# Patient Record
Sex: Male | Born: 1945
Health system: Southern US, Community
[De-identification: ages and names within clinical notes are randomized; demographics above are authoritative.]

## PROBLEM LIST (undated history)

## (undated) DIAGNOSIS — D509 Iron deficiency anemia, unspecified: Secondary | ICD-10-CM

## (undated) DIAGNOSIS — I10 Essential (primary) hypertension: Secondary | ICD-10-CM

## (undated) DIAGNOSIS — M1990 Unspecified osteoarthritis, unspecified site: Secondary | ICD-10-CM

## (undated) DIAGNOSIS — M79669 Pain in unspecified lower leg: Secondary | ICD-10-CM

## (undated) DIAGNOSIS — I219 Acute myocardial infarction, unspecified: Secondary | ICD-10-CM

## (undated) DIAGNOSIS — C801 Malignant (primary) neoplasm, unspecified: Secondary | ICD-10-CM

## (undated) DIAGNOSIS — I2510 Atherosclerotic heart disease of native coronary artery without angina pectoris: Secondary | ICD-10-CM

## (undated) DIAGNOSIS — M549 Dorsalgia, unspecified: Secondary | ICD-10-CM

## (undated) DIAGNOSIS — E785 Hyperlipidemia, unspecified: Secondary | ICD-10-CM

## (undated) DIAGNOSIS — R7303 Prediabetes: Secondary | ICD-10-CM

## (undated) HISTORY — DX: Prediabetes: R73.03

## (undated) HISTORY — DX: Atherosclerotic heart disease of native coronary artery without angina pectoris: I25.10

## (undated) HISTORY — DX: Pain in unspecified lower leg: M79.669

## (undated) HISTORY — DX: Unspecified osteoarthritis, unspecified site: M19.90

## (undated) HISTORY — DX: Dorsalgia, unspecified: M54.9

## (undated) HISTORY — DX: Hyperlipidemia, unspecified: E78.5

## (undated) HISTORY — PX: BACK SURGERY: SHX140

## (undated) HISTORY — PX: OTHER SURGICAL HISTORY: SHX169

## (undated) HISTORY — DX: Essential (primary) hypertension: I10

## (undated) HISTORY — DX: Iron deficiency anemia, unspecified: D50.9

## (undated) HISTORY — PX: CORONARY ARTERY BYPASS GRAFT: SHX141

## (undated) NOTE — Progress Notes (Signed)
 Formatting of this note might be different from the original. Subjective  Patient ID: Corey Daniel is a 47 y.o. male presenting to the Urgent Care with a chief complaint of Dizziness (Dizziness x 5 days).  Pt reports he has had episodes of dizziness the last five days. He is suppose to be taking lisinopril  for HTN and has not been taking it. Reports he hasn't fallen. He also reports he is supposed to be taking blood sugar since he is prediabetic and he hasn't been taking it. Orthostatic blood pressure obtained: 120/100 sitting, 130/90 standing, 150/90 lying. Blood sugar 169.   History provided by:  Patient Dizziness Quality:  Vertigo Severity:  Moderate Onset quality:  Sudden Duration:  5 days Timing:  Intermittent Chronicity:  New Context: bending over and standing up   Relieved by:  Turning head and closing eyes Worsened by:  Sitting upright Ineffective treatments:  None tried Risk factors: multiple medications   Risk factors: no heart disease    Objective  BP 125/90 (BP Location: Right arm, Patient Position: Sitting, BP Cuff Size: Adult)   Pulse 66   Temp 36.4 C (97.6 F) (Oral)   Resp 18   Ht 1.803 m (5' 11)   Wt 93.9 kg (207 lb)   SpO2 94%   BMI 28.87 kg/m   Physical Exam Constitutional:      Appearance: Normal appearance.  HENT:     Head: Normocephalic.     Right Ear: Tympanic membrane normal.     Left Ear: Tympanic membrane normal.     Nose: Nose normal.  Eyes:     Pupils: Pupils are equal, round, and reactive to light.  Cardiovascular:     Rate and Rhythm: Normal rate and regular rhythm.     Pulses: Normal pulses.     Heart sounds: Normal heart sounds.  Pulmonary:     Effort: Pulmonary effort is normal.     Breath sounds: Normal breath sounds.  Abdominal:     General: Abdomen is flat.  Musculoskeletal:        General: Normal range of motion.  Skin:    General: Skin is warm and dry.  Neurological:     General: No focal deficit present.     Mental  Status: He is alert and oriented to person, place, and time.     Comments: Dizziness spells on and off x 5 days   Psychiatric:        Mood and Affect: Mood normal.        Behavior: Behavior normal.     Assessment & Plan   Assessment & Plan Dizziness  Orders:   meclizine (Antivert) 25 MG tablet; Take 1 tablet by mouth 3 times a day as needed for dizziness for up to 5 days.  Primary hypertension  Orders:   lisinopril  10 MG tablet; Take 1 tablet by mouth in the morning.    In-House Lab Results:  No results found for this or any previous visit.   In-House Imaging Reads:    Procedure Documentation: Procedures   ED Course & MDM   Electronically signed by Avelina Kerns, CRNP at 05/22/2024 12:45 PM EDT

---

## 2008-11-17 HISTORY — PX: CATARACT EXTRACTION: SUR2

## 2010-02-11 ENCOUNTER — Ambulatory Visit: Payer: Self-pay | Admitting: Gastroenterology

## 2011-12-02 DIAGNOSIS — R079 Chest pain, unspecified: Secondary | ICD-10-CM | POA: Diagnosis not present

## 2011-12-23 DIAGNOSIS — Z79899 Other long term (current) drug therapy: Secondary | ICD-10-CM | POA: Diagnosis not present

## 2011-12-23 DIAGNOSIS — Z125 Encounter for screening for malignant neoplasm of prostate: Secondary | ICD-10-CM | POA: Diagnosis not present

## 2011-12-23 DIAGNOSIS — E119 Type 2 diabetes mellitus without complications: Secondary | ICD-10-CM | POA: Diagnosis not present

## 2011-12-23 DIAGNOSIS — I10 Essential (primary) hypertension: Secondary | ICD-10-CM | POA: Diagnosis not present

## 2011-12-30 DIAGNOSIS — I10 Essential (primary) hypertension: Secondary | ICD-10-CM | POA: Diagnosis not present

## 2011-12-30 DIAGNOSIS — E785 Hyperlipidemia, unspecified: Secondary | ICD-10-CM | POA: Diagnosis not present

## 2011-12-30 DIAGNOSIS — I25810 Atherosclerosis of coronary artery bypass graft(s) without angina pectoris: Secondary | ICD-10-CM | POA: Diagnosis not present

## 2011-12-30 DIAGNOSIS — E119 Type 2 diabetes mellitus without complications: Secondary | ICD-10-CM | POA: Diagnosis not present

## 2011-12-31 DIAGNOSIS — D239 Other benign neoplasm of skin, unspecified: Secondary | ICD-10-CM | POA: Diagnosis not present

## 2011-12-31 DIAGNOSIS — L821 Other seborrheic keratosis: Secondary | ICD-10-CM | POA: Diagnosis not present

## 2011-12-31 DIAGNOSIS — L919 Hypertrophic disorder of the skin, unspecified: Secondary | ICD-10-CM | POA: Diagnosis not present

## 2012-01-13 DIAGNOSIS — D2330 Other benign neoplasm of skin of unspecified part of face: Secondary | ICD-10-CM | POA: Diagnosis not present

## 2012-03-26 DIAGNOSIS — I70219 Atherosclerosis of native arteries of extremities with intermittent claudication, unspecified extremity: Secondary | ICD-10-CM | POA: Diagnosis not present

## 2012-03-26 DIAGNOSIS — M79609 Pain in unspecified limb: Secondary | ICD-10-CM | POA: Diagnosis not present

## 2012-03-26 DIAGNOSIS — I10 Essential (primary) hypertension: Secondary | ICD-10-CM | POA: Diagnosis not present

## 2012-03-26 DIAGNOSIS — I2510 Atherosclerotic heart disease of native coronary artery without angina pectoris: Secondary | ICD-10-CM | POA: Diagnosis not present

## 2012-05-06 DIAGNOSIS — Z79899 Other long term (current) drug therapy: Secondary | ICD-10-CM | POA: Diagnosis not present

## 2012-05-06 DIAGNOSIS — E119 Type 2 diabetes mellitus without complications: Secondary | ICD-10-CM | POA: Diagnosis not present

## 2012-05-07 DIAGNOSIS — M79609 Pain in unspecified limb: Secondary | ICD-10-CM | POA: Diagnosis not present

## 2012-05-13 DIAGNOSIS — E1149 Type 2 diabetes mellitus with other diabetic neurological complication: Secondary | ICD-10-CM | POA: Diagnosis not present

## 2012-05-13 DIAGNOSIS — G609 Hereditary and idiopathic neuropathy, unspecified: Secondary | ICD-10-CM | POA: Diagnosis not present

## 2012-05-13 DIAGNOSIS — I25810 Atherosclerosis of coronary artery bypass graft(s) without angina pectoris: Secondary | ICD-10-CM | POA: Diagnosis not present

## 2012-05-13 DIAGNOSIS — M549 Dorsalgia, unspecified: Secondary | ICD-10-CM | POA: Diagnosis not present

## 2012-05-13 DIAGNOSIS — E785 Hyperlipidemia, unspecified: Secondary | ICD-10-CM | POA: Diagnosis not present

## 2012-05-13 DIAGNOSIS — M47817 Spondylosis without myelopathy or radiculopathy, lumbosacral region: Secondary | ICD-10-CM | POA: Diagnosis not present

## 2012-05-13 DIAGNOSIS — I10 Essential (primary) hypertension: Secondary | ICD-10-CM | POA: Diagnosis not present

## 2012-07-05 DIAGNOSIS — E538 Deficiency of other specified B group vitamins: Secondary | ICD-10-CM | POA: Diagnosis not present

## 2012-07-05 DIAGNOSIS — I10 Essential (primary) hypertension: Secondary | ICD-10-CM | POA: Diagnosis not present

## 2012-07-05 DIAGNOSIS — M79609 Pain in unspecified limb: Secondary | ICD-10-CM | POA: Diagnosis not present

## 2012-07-05 DIAGNOSIS — E785 Hyperlipidemia, unspecified: Secondary | ICD-10-CM | POA: Diagnosis not present

## 2012-07-14 DIAGNOSIS — M76899 Other specified enthesopathies of unspecified lower limb, excluding foot: Secondary | ICD-10-CM | POA: Diagnosis not present

## 2012-07-14 DIAGNOSIS — E1142 Type 2 diabetes mellitus with diabetic polyneuropathy: Secondary | ICD-10-CM | POA: Diagnosis not present

## 2012-07-14 DIAGNOSIS — M48061 Spinal stenosis, lumbar region without neurogenic claudication: Secondary | ICD-10-CM | POA: Diagnosis not present

## 2012-07-14 DIAGNOSIS — M545 Low back pain: Secondary | ICD-10-CM | POA: Diagnosis not present

## 2012-07-20 DIAGNOSIS — M48061 Spinal stenosis, lumbar region without neurogenic claudication: Secondary | ICD-10-CM | POA: Diagnosis not present

## 2012-07-20 DIAGNOSIS — M76899 Other specified enthesopathies of unspecified lower limb, excluding foot: Secondary | ICD-10-CM | POA: Diagnosis not present

## 2012-07-20 DIAGNOSIS — E1142 Type 2 diabetes mellitus with diabetic polyneuropathy: Secondary | ICD-10-CM | POA: Diagnosis not present

## 2012-07-29 ENCOUNTER — Ambulatory Visit: Payer: Self-pay | Admitting: Neurology

## 2012-07-29 DIAGNOSIS — R9389 Abnormal findings on diagnostic imaging of other specified body structures: Secondary | ICD-10-CM | POA: Diagnosis not present

## 2012-07-29 DIAGNOSIS — M48061 Spinal stenosis, lumbar region without neurogenic claudication: Secondary | ICD-10-CM | POA: Diagnosis not present

## 2012-07-29 DIAGNOSIS — M5126 Other intervertebral disc displacement, lumbar region: Secondary | ICD-10-CM | POA: Diagnosis not present

## 2012-07-29 DIAGNOSIS — M549 Dorsalgia, unspecified: Secondary | ICD-10-CM | POA: Diagnosis not present

## 2012-08-10 DIAGNOSIS — M79609 Pain in unspecified limb: Secondary | ICD-10-CM | POA: Diagnosis not present

## 2012-08-10 DIAGNOSIS — I10 Essential (primary) hypertension: Secondary | ICD-10-CM | POA: Diagnosis not present

## 2012-08-10 DIAGNOSIS — M549 Dorsalgia, unspecified: Secondary | ICD-10-CM | POA: Diagnosis not present

## 2012-08-10 DIAGNOSIS — E785 Hyperlipidemia, unspecified: Secondary | ICD-10-CM | POA: Diagnosis not present

## 2012-08-19 DIAGNOSIS — M5137 Other intervertebral disc degeneration, lumbosacral region: Secondary | ICD-10-CM | POA: Diagnosis not present

## 2012-08-19 DIAGNOSIS — M48061 Spinal stenosis, lumbar region without neurogenic claudication: Secondary | ICD-10-CM | POA: Diagnosis not present

## 2012-08-19 DIAGNOSIS — IMO0002 Reserved for concepts with insufficient information to code with codable children: Secondary | ICD-10-CM | POA: Diagnosis not present

## 2012-08-30 DIAGNOSIS — I2510 Atherosclerotic heart disease of native coronary artery without angina pectoris: Secondary | ICD-10-CM | POA: Diagnosis not present

## 2012-09-08 DIAGNOSIS — E785 Hyperlipidemia, unspecified: Secondary | ICD-10-CM | POA: Diagnosis not present

## 2012-09-13 DIAGNOSIS — M48061 Spinal stenosis, lumbar region without neurogenic claudication: Secondary | ICD-10-CM | POA: Diagnosis not present

## 2012-09-13 DIAGNOSIS — M549 Dorsalgia, unspecified: Secondary | ICD-10-CM | POA: Diagnosis not present

## 2012-09-13 DIAGNOSIS — Z5181 Encounter for therapeutic drug level monitoring: Secondary | ICD-10-CM | POA: Diagnosis not present

## 2012-09-13 DIAGNOSIS — R7309 Other abnormal glucose: Secondary | ICD-10-CM | POA: Diagnosis not present

## 2012-09-13 DIAGNOSIS — G5790 Unspecified mononeuropathy of unspecified lower limb: Secondary | ICD-10-CM | POA: Diagnosis not present

## 2012-09-13 DIAGNOSIS — M159 Polyosteoarthritis, unspecified: Secondary | ICD-10-CM | POA: Diagnosis not present

## 2012-09-13 DIAGNOSIS — I2510 Atherosclerotic heart disease of native coronary artery without angina pectoris: Secondary | ICD-10-CM | POA: Diagnosis not present

## 2012-09-13 DIAGNOSIS — Z79899 Other long term (current) drug therapy: Secondary | ICD-10-CM | POA: Diagnosis not present

## 2012-09-13 DIAGNOSIS — Z01818 Encounter for other preprocedural examination: Secondary | ICD-10-CM | POA: Diagnosis not present

## 2012-09-13 DIAGNOSIS — M5137 Other intervertebral disc degeneration, lumbosacral region: Secondary | ICD-10-CM | POA: Diagnosis not present

## 2012-09-13 DIAGNOSIS — E782 Mixed hyperlipidemia: Secondary | ICD-10-CM | POA: Diagnosis not present

## 2012-09-13 DIAGNOSIS — I10 Essential (primary) hypertension: Secondary | ICD-10-CM | POA: Diagnosis not present

## 2012-09-13 DIAGNOSIS — Z951 Presence of aortocoronary bypass graft: Secondary | ICD-10-CM | POA: Diagnosis not present

## 2012-09-13 DIAGNOSIS — G4730 Sleep apnea, unspecified: Secondary | ICD-10-CM | POA: Diagnosis not present

## 2012-09-14 DIAGNOSIS — E538 Deficiency of other specified B group vitamins: Secondary | ICD-10-CM | POA: Diagnosis not present

## 2012-09-14 DIAGNOSIS — E559 Vitamin D deficiency, unspecified: Secondary | ICD-10-CM | POA: Diagnosis not present

## 2012-09-17 DIAGNOSIS — Z01818 Encounter for other preprocedural examination: Secondary | ICD-10-CM | POA: Diagnosis not present

## 2012-09-17 DIAGNOSIS — Z6830 Body mass index (BMI) 30.0-30.9, adult: Secondary | ICD-10-CM | POA: Diagnosis not present

## 2012-09-17 DIAGNOSIS — IMO0002 Reserved for concepts with insufficient information to code with codable children: Secondary | ICD-10-CM | POA: Diagnosis not present

## 2012-09-17 DIAGNOSIS — E669 Obesity, unspecified: Secondary | ICD-10-CM | POA: Diagnosis present

## 2012-09-17 DIAGNOSIS — G5790 Unspecified mononeuropathy of unspecified lower limb: Secondary | ICD-10-CM | POA: Diagnosis present

## 2012-09-17 DIAGNOSIS — M48061 Spinal stenosis, lumbar region without neurogenic claudication: Secondary | ICD-10-CM | POA: Diagnosis not present

## 2012-09-17 DIAGNOSIS — E119 Type 2 diabetes mellitus without complications: Secondary | ICD-10-CM | POA: Diagnosis present

## 2012-09-17 DIAGNOSIS — Z951 Presence of aortocoronary bypass graft: Secondary | ICD-10-CM | POA: Diagnosis not present

## 2012-09-17 DIAGNOSIS — M48062 Spinal stenosis, lumbar region with neurogenic claudication: Secondary | ICD-10-CM | POA: Diagnosis present

## 2012-09-17 DIAGNOSIS — M5137 Other intervertebral disc degeneration, lumbosacral region: Secondary | ICD-10-CM | POA: Diagnosis not present

## 2012-09-17 DIAGNOSIS — M4310 Spondylolisthesis, site unspecified: Secondary | ICD-10-CM | POA: Diagnosis not present

## 2012-09-17 DIAGNOSIS — M533 Sacrococcygeal disorders, not elsewhere classified: Secondary | ICD-10-CM | POA: Diagnosis present

## 2012-09-17 DIAGNOSIS — M7130 Other bursal cyst, unspecified site: Secondary | ICD-10-CM | POA: Diagnosis not present

## 2012-09-17 DIAGNOSIS — I10 Essential (primary) hypertension: Secondary | ICD-10-CM | POA: Diagnosis present

## 2012-09-17 DIAGNOSIS — I2510 Atherosclerotic heart disease of native coronary artery without angina pectoris: Secondary | ICD-10-CM | POA: Diagnosis present

## 2012-09-17 DIAGNOSIS — Z0189 Encounter for other specified special examinations: Secondary | ICD-10-CM | POA: Diagnosis not present

## 2012-11-03 DIAGNOSIS — I2510 Atherosclerotic heart disease of native coronary artery without angina pectoris: Secondary | ICD-10-CM | POA: Diagnosis not present

## 2012-11-03 DIAGNOSIS — M549 Dorsalgia, unspecified: Secondary | ICD-10-CM | POA: Diagnosis not present

## 2012-11-03 DIAGNOSIS — E785 Hyperlipidemia, unspecified: Secondary | ICD-10-CM | POA: Diagnosis not present

## 2012-11-03 DIAGNOSIS — I10 Essential (primary) hypertension: Secondary | ICD-10-CM | POA: Diagnosis not present

## 2012-11-24 DIAGNOSIS — R7309 Other abnormal glucose: Secondary | ICD-10-CM | POA: Diagnosis not present

## 2012-11-26 DIAGNOSIS — M549 Dorsalgia, unspecified: Secondary | ICD-10-CM | POA: Diagnosis not present

## 2013-02-02 DIAGNOSIS — E785 Hyperlipidemia, unspecified: Secondary | ICD-10-CM | POA: Diagnosis not present

## 2013-02-14 DIAGNOSIS — J029 Acute pharyngitis, unspecified: Secondary | ICD-10-CM | POA: Diagnosis not present

## 2013-02-22 DIAGNOSIS — L723 Sebaceous cyst: Secondary | ICD-10-CM | POA: Diagnosis not present

## 2013-03-02 DIAGNOSIS — E782 Mixed hyperlipidemia: Secondary | ICD-10-CM | POA: Diagnosis not present

## 2013-03-02 DIAGNOSIS — I10 Essential (primary) hypertension: Secondary | ICD-10-CM | POA: Diagnosis not present

## 2013-03-02 DIAGNOSIS — I25810 Atherosclerosis of coronary artery bypass graft(s) without angina pectoris: Secondary | ICD-10-CM | POA: Diagnosis not present

## 2013-03-03 DIAGNOSIS — M2550 Pain in unspecified joint: Secondary | ICD-10-CM | POA: Diagnosis not present

## 2013-03-10 DIAGNOSIS — M5137 Other intervertebral disc degeneration, lumbosacral region: Secondary | ICD-10-CM | POA: Diagnosis not present

## 2013-04-15 DIAGNOSIS — E785 Hyperlipidemia, unspecified: Secondary | ICD-10-CM | POA: Diagnosis not present

## 2013-04-15 DIAGNOSIS — I10 Essential (primary) hypertension: Secondary | ICD-10-CM | POA: Diagnosis not present

## 2013-04-15 DIAGNOSIS — R7309 Other abnormal glucose: Secondary | ICD-10-CM | POA: Diagnosis not present

## 2013-04-15 DIAGNOSIS — M461 Sacroiliitis, not elsewhere classified: Secondary | ICD-10-CM | POA: Diagnosis not present

## 2013-04-21 DIAGNOSIS — I2510 Atherosclerotic heart disease of native coronary artery without angina pectoris: Secondary | ICD-10-CM | POA: Diagnosis not present

## 2013-04-21 DIAGNOSIS — R7309 Other abnormal glucose: Secondary | ICD-10-CM | POA: Diagnosis not present

## 2013-04-21 DIAGNOSIS — E785 Hyperlipidemia, unspecified: Secondary | ICD-10-CM | POA: Diagnosis not present

## 2013-04-21 DIAGNOSIS — I10 Essential (primary) hypertension: Secondary | ICD-10-CM | POA: Diagnosis not present

## 2013-06-08 DIAGNOSIS — M48061 Spinal stenosis, lumbar region without neurogenic claudication: Secondary | ICD-10-CM | POA: Diagnosis not present

## 2013-06-08 DIAGNOSIS — M5137 Other intervertebral disc degeneration, lumbosacral region: Secondary | ICD-10-CM | POA: Diagnosis not present

## 2013-06-08 DIAGNOSIS — M549 Dorsalgia, unspecified: Secondary | ICD-10-CM | POA: Diagnosis not present

## 2013-06-13 DIAGNOSIS — M7130 Other bursal cyst, unspecified site: Secondary | ICD-10-CM | POA: Diagnosis not present

## 2013-06-13 DIAGNOSIS — M48062 Spinal stenosis, lumbar region with neurogenic claudication: Secondary | ICD-10-CM | POA: Diagnosis not present

## 2013-06-13 DIAGNOSIS — M5137 Other intervertebral disc degeneration, lumbosacral region: Secondary | ICD-10-CM | POA: Diagnosis not present

## 2013-06-13 DIAGNOSIS — M4310 Spondylolisthesis, site unspecified: Secondary | ICD-10-CM | POA: Diagnosis not present

## 2013-06-14 DIAGNOSIS — Z01818 Encounter for other preprocedural examination: Secondary | ICD-10-CM | POA: Diagnosis not present

## 2013-06-14 DIAGNOSIS — E785 Hyperlipidemia, unspecified: Secondary | ICD-10-CM | POA: Diagnosis not present

## 2013-06-14 DIAGNOSIS — I10 Essential (primary) hypertension: Secondary | ICD-10-CM | POA: Diagnosis not present

## 2013-06-14 DIAGNOSIS — I2510 Atherosclerotic heart disease of native coronary artery without angina pectoris: Secondary | ICD-10-CM | POA: Diagnosis not present

## 2013-06-29 DIAGNOSIS — I2510 Atherosclerotic heart disease of native coronary artery without angina pectoris: Secondary | ICD-10-CM | POA: Diagnosis not present

## 2013-06-29 DIAGNOSIS — I10 Essential (primary) hypertension: Secondary | ICD-10-CM | POA: Diagnosis not present

## 2013-07-11 DIAGNOSIS — I10 Essential (primary) hypertension: Secondary | ICD-10-CM | POA: Diagnosis not present

## 2013-07-11 DIAGNOSIS — M5137 Other intervertebral disc degeneration, lumbosacral region: Secondary | ICD-10-CM | POA: Diagnosis not present

## 2013-07-11 DIAGNOSIS — I2510 Atherosclerotic heart disease of native coronary artery without angina pectoris: Secondary | ICD-10-CM | POA: Diagnosis not present

## 2013-07-11 DIAGNOSIS — M7130 Other bursal cyst, unspecified site: Secondary | ICD-10-CM | POA: Diagnosis not present

## 2013-07-11 DIAGNOSIS — E119 Type 2 diabetes mellitus without complications: Secondary | ICD-10-CM | POA: Diagnosis not present

## 2013-07-11 DIAGNOSIS — D684 Acquired coagulation factor deficiency: Secondary | ICD-10-CM | POA: Diagnosis not present

## 2013-07-11 DIAGNOSIS — M4310 Spondylolisthesis, site unspecified: Secondary | ICD-10-CM | POA: Diagnosis not present

## 2013-07-11 DIAGNOSIS — M48062 Spinal stenosis, lumbar region with neurogenic claudication: Secondary | ICD-10-CM | POA: Diagnosis not present

## 2013-07-11 DIAGNOSIS — E782 Mixed hyperlipidemia: Secondary | ICD-10-CM | POA: Insufficient documentation

## 2013-07-11 DIAGNOSIS — R7303 Prediabetes: Secondary | ICD-10-CM | POA: Insufficient documentation

## 2013-07-11 DIAGNOSIS — IMO0002 Reserved for concepts with insufficient information to code with codable children: Secondary | ICD-10-CM | POA: Diagnosis not present

## 2013-07-11 DIAGNOSIS — M48061 Spinal stenosis, lumbar region without neurogenic claudication: Secondary | ICD-10-CM | POA: Diagnosis not present

## 2013-07-11 DIAGNOSIS — Z01818 Encounter for other preprocedural examination: Secondary | ICD-10-CM | POA: Diagnosis not present

## 2013-07-14 DIAGNOSIS — M7130 Other bursal cyst, unspecified site: Secondary | ICD-10-CM | POA: Insufficient documentation

## 2013-07-14 DIAGNOSIS — M48062 Spinal stenosis, lumbar region with neurogenic claudication: Secondary | ICD-10-CM | POA: Insufficient documentation

## 2013-07-14 DIAGNOSIS — M5414 Radiculopathy, thoracic region: Secondary | ICD-10-CM | POA: Insufficient documentation

## 2013-07-14 DIAGNOSIS — M5137 Other intervertebral disc degeneration, lumbosacral region: Secondary | ICD-10-CM | POA: Insufficient documentation

## 2013-07-14 DIAGNOSIS — M51379 Other intervertebral disc degeneration, lumbosacral region without mention of lumbar back pain or lower extremity pain: Secondary | ICD-10-CM | POA: Insufficient documentation

## 2013-07-15 DIAGNOSIS — M7130 Other bursal cyst, unspecified site: Secondary | ICD-10-CM | POA: Diagnosis not present

## 2013-07-15 DIAGNOSIS — M4310 Spondylolisthesis, site unspecified: Secondary | ICD-10-CM | POA: Diagnosis not present

## 2013-07-15 DIAGNOSIS — Z981 Arthrodesis status: Secondary | ICD-10-CM | POA: Insufficient documentation

## 2013-07-15 DIAGNOSIS — M5137 Other intervertebral disc degeneration, lumbosacral region: Secondary | ICD-10-CM | POA: Diagnosis not present

## 2013-07-15 DIAGNOSIS — E119 Type 2 diabetes mellitus without complications: Secondary | ICD-10-CM | POA: Diagnosis present

## 2013-07-15 DIAGNOSIS — Z0189 Encounter for other specified special examinations: Secondary | ICD-10-CM | POA: Diagnosis not present

## 2013-07-15 DIAGNOSIS — IMO0002 Reserved for concepts with insufficient information to code with codable children: Secondary | ICD-10-CM | POA: Diagnosis not present

## 2013-07-15 DIAGNOSIS — I10 Essential (primary) hypertension: Secondary | ICD-10-CM | POA: Diagnosis present

## 2013-07-15 DIAGNOSIS — M48062 Spinal stenosis, lumbar region with neurogenic claudication: Secondary | ICD-10-CM | POA: Diagnosis not present

## 2013-07-15 DIAGNOSIS — D649 Anemia, unspecified: Secondary | ICD-10-CM | POA: Diagnosis not present

## 2013-07-15 DIAGNOSIS — I2510 Atherosclerotic heart disease of native coronary artery without angina pectoris: Secondary | ICD-10-CM | POA: Diagnosis present

## 2013-07-17 DIAGNOSIS — Z862 Personal history of diseases of the blood and blood-forming organs and certain disorders involving the immune mechanism: Secondary | ICD-10-CM | POA: Insufficient documentation

## 2013-08-10 DIAGNOSIS — H2510 Age-related nuclear cataract, unspecified eye: Secondary | ICD-10-CM | POA: Diagnosis not present

## 2013-08-10 DIAGNOSIS — H43399 Other vitreous opacities, unspecified eye: Secondary | ICD-10-CM | POA: Diagnosis not present

## 2013-08-22 DIAGNOSIS — M47817 Spondylosis without myelopathy or radiculopathy, lumbosacral region: Secondary | ICD-10-CM | POA: Diagnosis not present

## 2013-08-22 DIAGNOSIS — I7090 Unspecified atherosclerosis: Secondary | ICD-10-CM | POA: Diagnosis not present

## 2013-08-29 DIAGNOSIS — Z23 Encounter for immunization: Secondary | ICD-10-CM | POA: Diagnosis not present

## 2013-08-29 DIAGNOSIS — I2510 Atherosclerotic heart disease of native coronary artery without angina pectoris: Secondary | ICD-10-CM | POA: Diagnosis not present

## 2013-08-29 DIAGNOSIS — E785 Hyperlipidemia, unspecified: Secondary | ICD-10-CM | POA: Diagnosis not present

## 2013-08-29 DIAGNOSIS — R7309 Other abnormal glucose: Secondary | ICD-10-CM | POA: Diagnosis not present

## 2013-08-29 DIAGNOSIS — I10 Essential (primary) hypertension: Secondary | ICD-10-CM | POA: Diagnosis not present

## 2013-09-02 DIAGNOSIS — H2510 Age-related nuclear cataract, unspecified eye: Secondary | ICD-10-CM | POA: Diagnosis not present

## 2013-09-02 DIAGNOSIS — H25019 Cortical age-related cataract, unspecified eye: Secondary | ICD-10-CM | POA: Diagnosis not present

## 2013-09-02 DIAGNOSIS — Z961 Presence of intraocular lens: Secondary | ICD-10-CM | POA: Diagnosis not present

## 2013-09-02 DIAGNOSIS — H02839 Dermatochalasis of unspecified eye, unspecified eyelid: Secondary | ICD-10-CM | POA: Diagnosis not present

## 2013-09-02 DIAGNOSIS — H25049 Posterior subcapsular polar age-related cataract, unspecified eye: Secondary | ICD-10-CM | POA: Diagnosis not present

## 2013-10-10 DIAGNOSIS — Z981 Arthrodesis status: Secondary | ICD-10-CM | POA: Diagnosis not present

## 2013-10-10 DIAGNOSIS — M47817 Spondylosis without myelopathy or radiculopathy, lumbosacral region: Secondary | ICD-10-CM | POA: Diagnosis not present

## 2013-10-28 DIAGNOSIS — H269 Unspecified cataract: Secondary | ICD-10-CM | POA: Diagnosis not present

## 2013-10-28 DIAGNOSIS — H2510 Age-related nuclear cataract, unspecified eye: Secondary | ICD-10-CM | POA: Diagnosis not present

## 2013-12-21 DIAGNOSIS — I10 Essential (primary) hypertension: Secondary | ICD-10-CM | POA: Diagnosis not present

## 2013-12-21 DIAGNOSIS — I2510 Atherosclerotic heart disease of native coronary artery without angina pectoris: Secondary | ICD-10-CM | POA: Diagnosis not present

## 2013-12-21 DIAGNOSIS — E785 Hyperlipidemia, unspecified: Secondary | ICD-10-CM | POA: Diagnosis not present

## 2013-12-21 DIAGNOSIS — M549 Dorsalgia, unspecified: Secondary | ICD-10-CM | POA: Diagnosis not present

## 2013-12-27 DIAGNOSIS — I10 Essential (primary) hypertension: Secondary | ICD-10-CM | POA: Diagnosis not present

## 2013-12-27 DIAGNOSIS — E119 Type 2 diabetes mellitus without complications: Secondary | ICD-10-CM | POA: Diagnosis not present

## 2013-12-27 DIAGNOSIS — I2510 Atherosclerotic heart disease of native coronary artery without angina pectoris: Secondary | ICD-10-CM | POA: Diagnosis not present

## 2014-01-09 DIAGNOSIS — Z981 Arthrodesis status: Secondary | ICD-10-CM | POA: Diagnosis not present

## 2014-01-09 DIAGNOSIS — M47817 Spondylosis without myelopathy or radiculopathy, lumbosacral region: Secondary | ICD-10-CM | POA: Diagnosis not present

## 2014-02-08 DIAGNOSIS — R0789 Other chest pain: Secondary | ICD-10-CM | POA: Diagnosis not present

## 2014-03-17 DIAGNOSIS — E785 Hyperlipidemia, unspecified: Secondary | ICD-10-CM | POA: Diagnosis not present

## 2014-03-17 DIAGNOSIS — I10 Essential (primary) hypertension: Secondary | ICD-10-CM | POA: Diagnosis not present

## 2014-03-21 DIAGNOSIS — I10 Essential (primary) hypertension: Secondary | ICD-10-CM | POA: Diagnosis not present

## 2014-03-21 DIAGNOSIS — E119 Type 2 diabetes mellitus without complications: Secondary | ICD-10-CM | POA: Diagnosis not present

## 2014-03-21 DIAGNOSIS — E785 Hyperlipidemia, unspecified: Secondary | ICD-10-CM | POA: Diagnosis not present

## 2014-04-03 DIAGNOSIS — Z4789 Encounter for other orthopedic aftercare: Secondary | ICD-10-CM | POA: Diagnosis not present

## 2014-04-03 DIAGNOSIS — M5137 Other intervertebral disc degeneration, lumbosacral region: Secondary | ICD-10-CM | POA: Diagnosis not present

## 2014-04-03 DIAGNOSIS — Z981 Arthrodesis status: Secondary | ICD-10-CM | POA: Diagnosis not present

## 2014-06-27 DIAGNOSIS — E119 Type 2 diabetes mellitus without complications: Secondary | ICD-10-CM | POA: Diagnosis not present

## 2014-06-27 DIAGNOSIS — I10 Essential (primary) hypertension: Secondary | ICD-10-CM | POA: Diagnosis not present

## 2014-06-27 DIAGNOSIS — E785 Hyperlipidemia, unspecified: Secondary | ICD-10-CM | POA: Diagnosis not present

## 2014-06-27 DIAGNOSIS — I2510 Atherosclerotic heart disease of native coronary artery without angina pectoris: Secondary | ICD-10-CM | POA: Diagnosis not present

## 2014-07-19 DIAGNOSIS — E119 Type 2 diabetes mellitus without complications: Secondary | ICD-10-CM | POA: Diagnosis not present

## 2014-07-19 DIAGNOSIS — I10 Essential (primary) hypertension: Secondary | ICD-10-CM | POA: Diagnosis not present

## 2014-07-19 DIAGNOSIS — E785 Hyperlipidemia, unspecified: Secondary | ICD-10-CM | POA: Diagnosis not present

## 2014-07-31 DIAGNOSIS — IMO0002 Reserved for concepts with insufficient information to code with codable children: Secondary | ICD-10-CM | POA: Diagnosis not present

## 2014-09-28 DIAGNOSIS — E119 Type 2 diabetes mellitus without complications: Secondary | ICD-10-CM | POA: Diagnosis not present

## 2014-09-28 DIAGNOSIS — I2510 Atherosclerotic heart disease of native coronary artery without angina pectoris: Secondary | ICD-10-CM | POA: Diagnosis not present

## 2014-09-28 DIAGNOSIS — G8929 Other chronic pain: Secondary | ICD-10-CM | POA: Diagnosis not present

## 2014-09-28 DIAGNOSIS — M549 Dorsalgia, unspecified: Secondary | ICD-10-CM | POA: Diagnosis not present

## 2014-09-28 DIAGNOSIS — E559 Vitamin D deficiency, unspecified: Secondary | ICD-10-CM | POA: Diagnosis not present

## 2014-09-28 DIAGNOSIS — I10 Essential (primary) hypertension: Secondary | ICD-10-CM | POA: Diagnosis not present

## 2014-09-28 DIAGNOSIS — Z23 Encounter for immunization: Secondary | ICD-10-CM | POA: Diagnosis not present

## 2014-09-28 DIAGNOSIS — E785 Hyperlipidemia, unspecified: Secondary | ICD-10-CM | POA: Diagnosis not present

## 2014-10-27 DIAGNOSIS — H903 Sensorineural hearing loss, bilateral: Secondary | ICD-10-CM | POA: Diagnosis not present

## 2014-10-27 DIAGNOSIS — H9319 Tinnitus, unspecified ear: Secondary | ICD-10-CM | POA: Diagnosis not present

## 2015-01-09 DIAGNOSIS — E119 Type 2 diabetes mellitus without complications: Secondary | ICD-10-CM | POA: Diagnosis not present

## 2015-01-09 DIAGNOSIS — M549 Dorsalgia, unspecified: Secondary | ICD-10-CM | POA: Diagnosis not present

## 2015-01-09 DIAGNOSIS — G8929 Other chronic pain: Secondary | ICD-10-CM | POA: Diagnosis not present

## 2015-01-09 DIAGNOSIS — Z1389 Encounter for screening for other disorder: Secondary | ICD-10-CM | POA: Diagnosis not present

## 2015-01-09 DIAGNOSIS — Z9181 History of falling: Secondary | ICD-10-CM | POA: Diagnosis not present

## 2015-01-09 DIAGNOSIS — I10 Essential (primary) hypertension: Secondary | ICD-10-CM | POA: Diagnosis not present

## 2015-01-09 DIAGNOSIS — I2510 Atherosclerotic heart disease of native coronary artery without angina pectoris: Secondary | ICD-10-CM | POA: Diagnosis not present

## 2015-01-09 DIAGNOSIS — E785 Hyperlipidemia, unspecified: Secondary | ICD-10-CM | POA: Diagnosis not present

## 2015-01-17 DIAGNOSIS — I25810 Atherosclerosis of coronary artery bypass graft(s) without angina pectoris: Secondary | ICD-10-CM | POA: Diagnosis not present

## 2015-01-17 DIAGNOSIS — E782 Mixed hyperlipidemia: Secondary | ICD-10-CM | POA: Diagnosis not present

## 2015-01-17 DIAGNOSIS — I2510 Atherosclerotic heart disease of native coronary artery without angina pectoris: Secondary | ICD-10-CM | POA: Diagnosis not present

## 2015-01-17 DIAGNOSIS — I10 Essential (primary) hypertension: Secondary | ICD-10-CM | POA: Diagnosis not present

## 2015-01-25 DIAGNOSIS — I25810 Atherosclerosis of coronary artery bypass graft(s) without angina pectoris: Secondary | ICD-10-CM | POA: Diagnosis not present

## 2015-02-08 DIAGNOSIS — E785 Hyperlipidemia, unspecified: Secondary | ICD-10-CM | POA: Diagnosis not present

## 2015-02-08 DIAGNOSIS — M549 Dorsalgia, unspecified: Secondary | ICD-10-CM | POA: Diagnosis not present

## 2015-02-08 DIAGNOSIS — E119 Type 2 diabetes mellitus without complications: Secondary | ICD-10-CM | POA: Diagnosis not present

## 2015-02-08 DIAGNOSIS — G8929 Other chronic pain: Secondary | ICD-10-CM | POA: Diagnosis not present

## 2015-02-08 DIAGNOSIS — I2510 Atherosclerotic heart disease of native coronary artery without angina pectoris: Secondary | ICD-10-CM | POA: Diagnosis not present

## 2015-02-08 DIAGNOSIS — I10 Essential (primary) hypertension: Secondary | ICD-10-CM | POA: Diagnosis not present

## 2015-03-08 DIAGNOSIS — E119 Type 2 diabetes mellitus without complications: Secondary | ICD-10-CM | POA: Diagnosis not present

## 2015-03-08 DIAGNOSIS — M549 Dorsalgia, unspecified: Secondary | ICD-10-CM | POA: Diagnosis not present

## 2015-03-08 DIAGNOSIS — E785 Hyperlipidemia, unspecified: Secondary | ICD-10-CM | POA: Diagnosis not present

## 2015-03-13 DIAGNOSIS — E119 Type 2 diabetes mellitus without complications: Secondary | ICD-10-CM | POA: Diagnosis not present

## 2015-03-13 DIAGNOSIS — M549 Dorsalgia, unspecified: Secondary | ICD-10-CM | POA: Diagnosis not present

## 2015-03-13 DIAGNOSIS — E785 Hyperlipidemia, unspecified: Secondary | ICD-10-CM | POA: Diagnosis not present

## 2015-03-13 DIAGNOSIS — I10 Essential (primary) hypertension: Secondary | ICD-10-CM | POA: Diagnosis not present

## 2015-03-13 DIAGNOSIS — I2510 Atherosclerotic heart disease of native coronary artery without angina pectoris: Secondary | ICD-10-CM | POA: Diagnosis not present

## 2015-03-22 DIAGNOSIS — D2261 Melanocytic nevi of right upper limb, including shoulder: Secondary | ICD-10-CM | POA: Diagnosis not present

## 2015-03-22 DIAGNOSIS — D2271 Melanocytic nevi of right lower limb, including hip: Secondary | ICD-10-CM | POA: Diagnosis not present

## 2015-03-22 DIAGNOSIS — L918 Other hypertrophic disorders of the skin: Secondary | ICD-10-CM | POA: Diagnosis not present

## 2015-03-22 DIAGNOSIS — D225 Melanocytic nevi of trunk: Secondary | ICD-10-CM | POA: Diagnosis not present

## 2015-05-01 ENCOUNTER — Other Ambulatory Visit: Payer: Self-pay | Admitting: Family Medicine

## 2015-05-07 DIAGNOSIS — J069 Acute upper respiratory infection, unspecified: Secondary | ICD-10-CM | POA: Diagnosis not present

## 2015-05-07 DIAGNOSIS — J029 Acute pharyngitis, unspecified: Secondary | ICD-10-CM | POA: Diagnosis not present

## 2015-07-12 ENCOUNTER — Telehealth: Payer: Self-pay | Admitting: Family Medicine

## 2015-07-12 NOTE — Telephone Encounter (Signed)
Pt aware of Dr. Luan Pulling recommendation on having labs drawn prior to visit. Pt says he will wait until ov.

## 2015-07-12 NOTE — Telephone Encounter (Signed)
Insurance usually covers better if ordered and coded at time of visit.  If he is insistent on getting before, then we can order.  Up to him.-jh

## 2015-07-12 NOTE — Telephone Encounter (Signed)
Pt called to see if he needs to come by for lab order and have labs done before his appt on Aug 30th.  His call back number is [phone]

## 2015-07-12 NOTE — Telephone Encounter (Signed)
LMTCOB. X1.

## 2015-07-12 NOTE — Telephone Encounter (Signed)
Pt was last seen 03/13/15. Per AVS pt would f/u in 4 mos. Pt wanting to have labs drawn prior to 07/17/15 appt.

## 2015-07-17 ENCOUNTER — Ambulatory Visit: Payer: Self-pay | Admitting: Family Medicine

## 2015-07-24 DIAGNOSIS — E119 Type 2 diabetes mellitus without complications: Secondary | ICD-10-CM | POA: Diagnosis not present

## 2015-07-24 DIAGNOSIS — E782 Mixed hyperlipidemia: Secondary | ICD-10-CM | POA: Diagnosis not present

## 2015-07-24 DIAGNOSIS — I10 Essential (primary) hypertension: Secondary | ICD-10-CM | POA: Diagnosis not present

## 2015-07-24 DIAGNOSIS — I2510 Atherosclerotic heart disease of native coronary artery without angina pectoris: Secondary | ICD-10-CM | POA: Diagnosis not present

## 2015-07-28 ENCOUNTER — Other Ambulatory Visit: Payer: Self-pay | Admitting: Family Medicine

## 2015-08-08 ENCOUNTER — Encounter: Payer: Self-pay | Admitting: Family Medicine

## 2015-08-08 ENCOUNTER — Ambulatory Visit (INDEPENDENT_AMBULATORY_CARE_PROVIDER_SITE_OTHER): Payer: Medicare Other | Admitting: Family Medicine

## 2015-08-08 VITALS — BP 132/87 | HR 60 | Temp 97.9°F | Resp 16 | Ht 71.0 in | Wt 210.6 lb

## 2015-08-08 DIAGNOSIS — E1165 Type 2 diabetes mellitus with hyperglycemia: Secondary | ICD-10-CM | POA: Diagnosis not present

## 2015-08-08 DIAGNOSIS — Z23 Encounter for immunization: Secondary | ICD-10-CM | POA: Diagnosis not present

## 2015-08-08 DIAGNOSIS — E785 Hyperlipidemia, unspecified: Secondary | ICD-10-CM

## 2015-08-08 DIAGNOSIS — I10 Essential (primary) hypertension: Secondary | ICD-10-CM | POA: Diagnosis not present

## 2015-08-08 LAB — POCT GLYCOSYLATED HEMOGLOBIN (HGB A1C): Hemoglobin A1C: 6.1

## 2015-08-08 NOTE — Progress Notes (Signed)
Name: Corey Daniel   MRN: [id number]    DOB: [birth year]   Date:08/08/2015       Progress Note  Subjective  Chief Complaint  Chief Complaint  Patient presents with  . Hypertension    4 month    HPI Here for f/u of DM and HBP and elevated lipids.  Taking Zetia and Metoprolol succ as directed.  No c/o.  Feeling well.  No problem-specific assessment & plan notes found for this encounter.   History reviewed. No pertinent past medical history.  Social History  Substance Use Topics  . Smoking status: Former Research scientist (life sciences)  . Smokeless tobacco: Not on file  . Alcohol Use: No     Current outpatient prescriptions:  .  aspirin 81 MG chewable tablet, Chew by mouth., Disp: , Rfl:  .  Cholecalciferol (VITAMIN D3) 2000 UNITS capsule, Take by mouth., Disp: , Rfl:  .  Coenzyme Q10 100 MG capsule, Take by mouth., Disp: , Rfl:  .  ezetimibe (ZETIA) 10 MG tablet, Take by mouth., Disp: , Rfl:  .  Fish Oil-Cholecalciferol (FISH OIL + D3) 1000-1000 MG-UNIT CAPS, Take by mouth., Disp: , Rfl:  .  TOPROL XL 25 MG 24 hr tablet, , Disp: , Rfl:  .  metFORMIN (GLUCOPHAGE) 500 MG tablet, TAKE 1 TABLET DAILY (Patient not taking: Reported on 08/08/2015), Disp: 90 tablet, Rfl: 3 .  metoprolol tartrate (LOPRESSOR) 25 MG tablet, Take by mouth., Disp: , Rfl:   Not on File  Review of Systems  Constitutional: Negative for fever, chills, weight loss and malaise/fatigue.  HENT: Negative for hearing loss.   Eyes: Negative for blurred vision and double vision.  Respiratory: Negative for cough, sputum production, shortness of breath and wheezing.   Cardiovascular: Negative for chest pain, palpitations, orthopnea and leg swelling.  Gastrointestinal: Negative for heartburn, abdominal pain and blood in stool.  Genitourinary: Negative for dysuria, urgency and frequency.  Musculoskeletal: Negative for myalgias and joint pain.  Skin: Negative for rash.  Neurological: Negative for dizziness, sensory change, focal weakness,  weakness and headaches.      Objective  Filed Vitals:   08/08/15 0819  BP: 132/87  Pulse: 60  Temp: 97.9 F (36.6 C)  TempSrc: Oral  Resp: 16  Height: 5\' 11"  (1.803 m)  Weight: 210 lb 9.6 oz (95.528 kg)     Physical Exam  Constitutional: He is oriented to person, place, and time and well-developed, well-nourished, and in no distress. No distress.  HENT:  Head: Normocephalic and atraumatic.  Eyes: Conjunctivae and EOM are normal. Pupils are equal, round, and reactive to light. No scleral icterus.  Neck: Normal range of motion. Neck supple. Carotid bruit is not present. No thyromegaly present.  Cardiovascular: Normal rate, regular rhythm, normal heart sounds and intact distal pulses.  Exam reveals no gallop and no friction rub.   No murmur heard. Pulmonary/Chest: Effort normal and breath sounds normal. No respiratory distress. He has no wheezes. He has no rales.  Abdominal: Soft. Bowel sounds are normal. He exhibits no distension, no abdominal bruit and no mass. There is no tenderness.  Musculoskeletal: He exhibits no edema.  Lymphadenopathy:    He has no cervical adenopathy.  Neurological: He is alert and oriented to person, place, and time.  Vitals reviewed.     Recent Results (from the past 2160 hour(s))  POCT HgB A1C     Status: Normal   Collection Time: 08/08/15  8:42 AM  Result Value Ref Range   Hemoglobin A1C 6.1  Assessment & Plan  1. Type 2 diabetes mellitus with hyperglycemia -diet controlled - aspirin 81 MG chewable tablet; Chew by mouth. - POCT HgB A1C - Comprehensive Metabolic Panel (CMET) - CBC with Differential  2. Essential hypertension  - TOPROL XL 25 MG 24 hr tablet;   3. Need for influenza vaccination  - Flu vaccine HIGH DOSE PF (Fluzone High dose)  4. HLD (hyperlipidemia)  - Coenzyme Q10 100 MG capsule; Take by mouth. - ezetimibe (ZETIA) 10 MG tablet; Take by mouth. - Fish Oil-Cholecalciferol (FISH OIL + D3) 1000-1000 MG-UNIT  CAPS; Take by mouth. - Lipid Profile

## 2015-08-08 NOTE — Patient Instructions (Signed)
Continue current meds 

## 2015-08-10 DIAGNOSIS — I10 Essential (primary) hypertension: Secondary | ICD-10-CM | POA: Diagnosis not present

## 2015-08-10 DIAGNOSIS — E1165 Type 2 diabetes mellitus with hyperglycemia: Secondary | ICD-10-CM | POA: Diagnosis not present

## 2015-08-10 LAB — CBC WITH DIFFERENTIAL/PLATELET
BASOS ABS: 0.1 10*3/uL (ref 0.0–0.2)
Basos: 1 %
EOS (ABSOLUTE): 0.3 10*3/uL (ref 0.0–0.4)
Eos: 4 %
Hematocrit: 45.7 % (ref 37.5–51.0)
Hemoglobin: 16.2 g/dL (ref 12.6–17.7)
Immature Grans (Abs): 0 10*3/uL (ref 0.0–0.1)
Immature Granulocytes: 0 %
LYMPHS ABS: 1.6 10*3/uL (ref 0.7–3.1)
Lymphs: 26 %
MCH: 30.5 pg (ref 26.6–33.0)
MCHC: 35.4 g/dL (ref 31.5–35.7)
MCV: 86 fL (ref 79–97)
MONOS ABS: 0.5 10*3/uL (ref 0.1–0.9)
Monocytes: 8 %
Neutrophils Absolute: 3.6 10*3/uL (ref 1.4–7.0)
Neutrophils: 61 %
PLATELETS: 186 10*3/uL (ref 150–379)
RBC: 5.31 x10E6/uL (ref 4.14–5.80)
RDW: 13.1 % (ref 12.3–15.4)
WBC: 6 10*3/uL (ref 3.4–10.8)

## 2015-08-11 LAB — COMPREHENSIVE METABOLIC PANEL
ALK PHOS: 60 IU/L (ref 39–117)
ALT: 19 IU/L (ref 0–44)
AST: 18 IU/L (ref 0–40)
Albumin/Globulin Ratio: 1.7 (ref 1.1–2.5)
Albumin: 4.4 g/dL (ref 3.6–4.8)
BILIRUBIN TOTAL: 0.5 mg/dL (ref 0.0–1.2)
BUN/Creatinine Ratio: 15 (ref 10–22)
BUN: 15 mg/dL (ref 8–27)
CHLORIDE: 102 mmol/L (ref 97–108)
CO2: 24 mmol/L (ref 18–29)
CREATININE: 1.03 mg/dL (ref 0.76–1.27)
Calcium: 9.5 mg/dL (ref 8.6–10.2)
GFR calc Af Amer: 85 mL/min/{1.73_m2} (ref >59)
GFR calc non Af Amer: 74 mL/min/{1.73_m2} (ref >59)
GLOBULIN, TOTAL: 2.6 g/dL (ref 1.5–4.5)
GLUCOSE: 121 mg/dL — AB (ref 65–99)
Potassium: 5 mmol/L (ref 3.5–5.2)
SODIUM: 142 mmol/L (ref 134–144)
Total Protein: 7 g/dL (ref 6.0–8.5)

## 2015-08-11 LAB — LIPID PANEL
CHOLESTEROL TOTAL: 229 mg/dL — AB (ref 100–199)
Chol/HDL Ratio: 4.3 ratio units (ref 0.0–5.0)
HDL: 53 mg/dL (ref >39)
LDL CALC: 148 mg/dL — AB (ref 0–99)
TRIGLYCERIDES: 142 mg/dL (ref 0–149)
VLDL Cholesterol Cal: 28 mg/dL (ref 5–40)

## 2015-08-13 NOTE — Progress Notes (Signed)
Advised.JH  

## 2015-09-13 DIAGNOSIS — R05 Cough: Secondary | ICD-10-CM | POA: Diagnosis not present

## 2015-09-13 DIAGNOSIS — T464X5A Adverse effect of angiotensin-converting-enzyme inhibitors, initial encounter: Secondary | ICD-10-CM | POA: Diagnosis not present

## 2015-09-24 ENCOUNTER — Other Ambulatory Visit: Payer: Self-pay | Admitting: Family Medicine

## 2015-10-09 DIAGNOSIS — L821 Other seborrheic keratosis: Secondary | ICD-10-CM | POA: Diagnosis not present

## 2015-10-09 DIAGNOSIS — L918 Other hypertrophic disorders of the skin: Secondary | ICD-10-CM | POA: Diagnosis not present

## 2016-02-04 DIAGNOSIS — E782 Mixed hyperlipidemia: Secondary | ICD-10-CM | POA: Diagnosis not present

## 2016-02-04 DIAGNOSIS — E119 Type 2 diabetes mellitus without complications: Secondary | ICD-10-CM | POA: Diagnosis not present

## 2016-02-04 DIAGNOSIS — I10 Essential (primary) hypertension: Secondary | ICD-10-CM | POA: Diagnosis not present

## 2016-02-04 DIAGNOSIS — I2510 Atherosclerotic heart disease of native coronary artery without angina pectoris: Secondary | ICD-10-CM | POA: Diagnosis not present

## 2016-02-05 ENCOUNTER — Ambulatory Visit (INDEPENDENT_AMBULATORY_CARE_PROVIDER_SITE_OTHER): Payer: Medicare Other | Admitting: Family Medicine

## 2016-02-05 ENCOUNTER — Encounter: Payer: Self-pay | Admitting: Family Medicine

## 2016-02-05 VITALS — BP 131/85 | HR 66 | Temp 98.5°F | Resp 16 | Ht 70.0 in | Wt 212.0 lb

## 2016-02-05 DIAGNOSIS — Z794 Long term (current) use of insulin: Secondary | ICD-10-CM | POA: Diagnosis not present

## 2016-02-05 DIAGNOSIS — I2510 Atherosclerotic heart disease of native coronary artery without angina pectoris: Secondary | ICD-10-CM

## 2016-02-05 DIAGNOSIS — E0801 Diabetes mellitus due to underlying condition with hyperosmolarity with coma: Secondary | ICD-10-CM

## 2016-02-05 DIAGNOSIS — E785 Hyperlipidemia, unspecified: Secondary | ICD-10-CM | POA: Diagnosis not present

## 2016-02-05 LAB — POCT GLYCOSYLATED HEMOGLOBIN (HGB A1C): Hemoglobin A1C: 6

## 2016-02-05 NOTE — Progress Notes (Signed)
Name: Corey Daniel   MRN: [id number]    DOB: 1946/02/28   Date:02/05/2016       Progress Note  Subjective  Chief Complaint  Chief Complaint  Patient presents with  . Follow-up    A1c    HPI For f/u of DM.  Sees Card and has had EKG and stress test to be done next week.   He has no Card. Sx.  Feeling well.  No c/o.  No problem-specific assessment & plan notes found for this encounter.   Past Medical History  Diagnosis Date  . Lower leg pain   . Diabetes mellitus without complication ([place])   . Hypertension   . ASCVD (arteriosclerotic cardiovascular disease)   . Back pain     with radiation  . Hyperlipidemia     Past Surgical History  Procedure Laterality Date  . Back surgery    . Coronary artery bypass graft      Family History  Problem Relation Age of Onset  . Heart disease Mother   . Coarctation of the aorta Brother     Social History   Social History  . Marital Status: Married    Spouse Name: N/A  . Number of Children: N/A  . Years of Education: N/A   Occupational History  . Not on file.   Social History Main Topics  . Smoking status: Former Research scientist (life sciences)  . Smokeless tobacco: Not on file  . Alcohol Use: No  . Drug Use: No  . Sexual Activity: Not on file   Other Topics Concern  . Not on file   Social History Narrative     Current outpatient prescriptions:  .  aspirin 81 MG chewable tablet, Chew by mouth., Disp: , Rfl:  .  Cholecalciferol (VITAMIN D3) 2000 UNITS capsule, Take by mouth., Disp: , Rfl:  .  Coenzyme Q10 100 MG capsule, Take by mouth., Disp: , Rfl:  .  Fish Oil-Cholecalciferol (FISH OIL + D3) 1000-1000 MG-UNIT CAPS, Take by mouth., Disp: , Rfl:  .  TOPROL XL 25 MG 24 hr tablet, , Disp: , Rfl:  .  ZETIA 10 MG tablet, TAKE 1 TABLET AT BEDTIME, Disp: 90 tablet, Rfl: 3  No Known Allergies   Review of Systems  Constitutional: Negative for fever, chills, weight loss and malaise/fatigue.  HENT: Negative for hearing loss.   Eyes: Negative  for blurred vision and double vision.  Respiratory: Negative for cough, shortness of breath and wheezing.   Cardiovascular: Negative for chest pain, palpitations and leg swelling.  Gastrointestinal: Negative for heartburn, abdominal pain and blood in stool.  Genitourinary: Negative for dysuria, urgency and frequency.  Musculoskeletal: Negative for myalgias and joint pain.  Skin: Negative for rash.  Neurological: Negative for dizziness, tremors, weakness and headaches.      Objective  Filed Vitals:   02/05/16 0821  BP: 131/85  Pulse: 66  Temp: 98.5 F (36.9 C)  TempSrc: Oral  Resp: 16  Height: 5\' 10"  (1.778 m)  Weight: 212 lb (96.163 kg)    Physical Exam  Constitutional: He is well-developed, well-nourished, and in no distress. No distress.  HENT:  Head: Normocephalic and atraumatic.  Eyes: Conjunctivae and EOM are normal. Pupils are equal, round, and reactive to light. No scleral icterus.  Neck: Normal range of motion. Neck supple. Carotid bruit is not present. No thyromegaly present.  Cardiovascular: Normal rate, regular rhythm and normal heart sounds.  Exam reveals no gallop and no friction rub.   No murmur heard. Pulmonary/Chest:  Effort normal and breath sounds normal. No respiratory distress. He has no wheezes. He has no rales.  Abdominal: Soft. Bowel sounds are normal. He exhibits no distension, no abdominal bruit and no mass. There is no tenderness.  Musculoskeletal: He exhibits no edema.  Lymphadenopathy:    He has no cervical adenopathy.  Neurological: He is alert.  Vitals reviewed.      No results found for this or any previous visit (from the past 2160 hour(s)).   Assessment & Plan  Problem List Items Addressed This Visit      Cardiovascular and Mediastinum   Arteriosclerosis of coronary artery     Endocrine   Diabetes ([place]) - Primary   Relevant Orders   Comprehensive Metabolic Panel (CMET)   POCT HgB A1C     Other   HLD (hyperlipidemia)    Relevant Orders   Lipid Profile      No orders of the defined types were placed in this encounter.   1. Diabetes mellitus due to underlying condition with hyperosmolarity and coma, with long-term current use of insulin (HCC) Cont. Watch diet - Comprehensive Metabolic Panel (CMET) - POCT HgB A1C-6.0  2. Arteriosclerosis of coronary artery Cont. Toprol 3. HLD (hyperlipidemia) Cont. Zetia - Lipid Profile

## 2016-02-06 DIAGNOSIS — E0801 Diabetes mellitus due to underlying condition with hyperosmolarity with coma: Secondary | ICD-10-CM | POA: Diagnosis not present

## 2016-02-06 DIAGNOSIS — E785 Hyperlipidemia, unspecified: Secondary | ICD-10-CM | POA: Diagnosis not present

## 2016-02-06 DIAGNOSIS — Z794 Long term (current) use of insulin: Secondary | ICD-10-CM | POA: Diagnosis not present

## 2016-02-07 ENCOUNTER — Encounter: Payer: Self-pay | Admitting: *Deleted

## 2016-02-07 LAB — COMPREHENSIVE METABOLIC PANEL
A/G RATIO: 2 (ref 1.2–2.2)
ALT: 18 IU/L (ref 0–44)
AST: 19 IU/L (ref 0–40)
Albumin: 4.7 g/dL (ref 3.5–4.8)
Alkaline Phosphatase: 57 IU/L (ref 39–117)
BILIRUBIN TOTAL: 0.5 mg/dL (ref 0.0–1.2)
BUN/Creatinine Ratio: 17 (ref 10–22)
BUN: 18 mg/dL (ref 8–27)
CALCIUM: 9.5 mg/dL (ref 8.6–10.2)
CHLORIDE: 100 mmol/L (ref 96–106)
CO2: 23 mmol/L (ref 18–29)
Creatinine, Ser: 1.08 mg/dL (ref 0.76–1.27)
GFR calc Af Amer: 80 mL/min/{1.73_m2} (ref >59)
GFR, EST NON AFRICAN AMERICAN: 69 mL/min/{1.73_m2} (ref >59)
GLOBULIN, TOTAL: 2.4 g/dL (ref 1.5–4.5)
Glucose: 107 mg/dL — ABNORMAL HIGH (ref 65–99)
POTASSIUM: 4.8 mmol/L (ref 3.5–5.2)
SODIUM: 139 mmol/L (ref 134–144)
Total Protein: 7.1 g/dL (ref 6.0–8.5)

## 2016-02-07 LAB — LIPID PANEL
CHOL/HDL RATIO: 5.5 ratio — AB (ref 0.0–5.0)
CHOLESTEROL TOTAL: 218 mg/dL — AB (ref 100–199)
HDL: 40 mg/dL (ref >39)
LDL CALC: 137 mg/dL — AB (ref 0–99)
TRIGLYCERIDES: 203 mg/dL — AB (ref 0–149)
VLDL CHOLESTEROL CAL: 41 mg/dL — AB (ref 5–40)

## 2016-02-12 DIAGNOSIS — I2510 Atherosclerotic heart disease of native coronary artery without angina pectoris: Secondary | ICD-10-CM | POA: Diagnosis not present

## 2016-03-20 DIAGNOSIS — L821 Other seborrheic keratosis: Secondary | ICD-10-CM | POA: Diagnosis not present

## 2016-03-20 DIAGNOSIS — L570 Actinic keratosis: Secondary | ICD-10-CM | POA: Diagnosis not present

## 2016-03-20 DIAGNOSIS — D0439 Carcinoma in situ of skin of other parts of face: Secondary | ICD-10-CM | POA: Diagnosis not present

## 2016-03-20 DIAGNOSIS — D485 Neoplasm of uncertain behavior of skin: Secondary | ICD-10-CM | POA: Diagnosis not present

## 2016-03-20 DIAGNOSIS — X32XXXA Exposure to sunlight, initial encounter: Secondary | ICD-10-CM | POA: Diagnosis not present

## 2016-03-21 DIAGNOSIS — R05 Cough: Secondary | ICD-10-CM | POA: Diagnosis not present

## 2016-03-26 DIAGNOSIS — R05 Cough: Secondary | ICD-10-CM | POA: Diagnosis not present

## 2016-08-07 ENCOUNTER — Encounter: Payer: Self-pay | Admitting: Family Medicine

## 2016-08-07 ENCOUNTER — Ambulatory Visit (INDEPENDENT_AMBULATORY_CARE_PROVIDER_SITE_OTHER): Payer: Medicare Other | Admitting: Family Medicine

## 2016-08-07 VITALS — BP 137/81 | HR 67 | Temp 98.0°F | Resp 16 | Ht 70.0 in | Wt 204.0 lb

## 2016-08-07 DIAGNOSIS — I10 Essential (primary) hypertension: Secondary | ICD-10-CM

## 2016-08-07 DIAGNOSIS — E119 Type 2 diabetes mellitus without complications: Secondary | ICD-10-CM | POA: Diagnosis not present

## 2016-08-07 DIAGNOSIS — I2510 Atherosclerotic heart disease of native coronary artery without angina pectoris: Secondary | ICD-10-CM | POA: Diagnosis not present

## 2016-08-07 DIAGNOSIS — Z23 Encounter for immunization: Secondary | ICD-10-CM

## 2016-08-07 DIAGNOSIS — E785 Hyperlipidemia, unspecified: Secondary | ICD-10-CM

## 2016-08-07 DIAGNOSIS — E0800 Diabetes mellitus due to underlying condition with hyperosmolarity without nonketotic hyperglycemic-hyperosmolar coma (NKHHC): Secondary | ICD-10-CM

## 2016-08-07 LAB — POCT GLYCOSYLATED HEMOGLOBIN (HGB A1C): Hemoglobin A1C: 6.4

## 2016-08-07 MED ORDER — ROSUVASTATIN CALCIUM 5 MG PO TABS: 5.0000 mg | ORAL_TABLET | Freq: Every day | ORAL | 3 refills | Status: DC

## 2016-08-07 MED ORDER — CARVEDILOL 3.125 MG PO TABS: 3.1250 mg | ORAL_TABLET | Freq: Two times a day (BID) | ORAL | 3 refills | Status: DC

## 2016-08-07 MED ORDER — EZETIMIBE 10 MG PO TABS: 10.0000 mg | ORAL_TABLET | Freq: Every day | ORAL | 3 refills | Status: DC

## 2016-08-07 NOTE — Patient Instructions (Signed)
He is to take Crestor 5 mg, twice a week.

## 2016-08-07 NOTE — Progress Notes (Signed)
Name: Corey Daniel   MRN: [id number]    DOB: June 27, 1946   Date:08/07/2016       Progress Note  Subjective  Chief Complaint  Chief Complaint  Patient presents with  . Diabetes    HPI Here for f/u of DM.  Also with elevated cholesterol and ASCVD.  Needs diab- etic foot exam.  He has continued to lose some weight.   BSs  Not checked at home.  No problem-specific Assessment & Plan notes found for this encounter.   Past Medical History:  Diagnosis Date  . ASCVD (arteriosclerotic cardiovascular disease)   . Back pain    with radiation  . Diabetes mellitus without complication ([place])   . Hyperlipidemia   . Hypertension   . Lower leg pain     Past Surgical History:  Procedure Laterality Date  . BACK SURGERY    . CORONARY ARTERY BYPASS GRAFT      Family History  Problem Relation Age of Onset  . Heart disease Mother   . Coarctation of the aorta Brother     Social History   Social History  . Marital status: Married    Spouse name: N/A  . Number of children: N/A  . Years of education: N/A   Occupational History  . Not on file.   Social History Main Topics  . Smoking status: Former Research scientist (life sciences)  . Smokeless tobacco: Never Used  . Alcohol use No  . Drug use: No  . Sexual activity: Not on file   Other Topics Concern  . Not on file   Social History Narrative  . No narrative on file     Current Outpatient Prescriptions:  .  aspirin 81 MG chewable tablet, Chew by mouth., Disp: , Rfl:  .  Cholecalciferol (VITAMIN D3) 2000 UNITS capsule, Take by mouth., Disp: , Rfl:  .  Coenzyme Q10 100 MG capsule, Take by mouth., Disp: , Rfl:  .  ezetimibe (ZETIA) 10 MG tablet, Take 1 tablet (10 mg total) by mouth at bedtime., Disp: 90 tablet, Rfl: 3 .  Fish Oil-Cholecalciferol (FISH OIL + D3) 1000-1000 MG-UNIT CAPS, Take by mouth., Disp: , Rfl:  .  carvedilol (COREG) 3.125 MG tablet, Take 1 tablet (3.125 mg total) by mouth 2 (two) times daily with a meal., Disp: 180 tablet, Rfl: 3 .   rosuvastatin (CRESTOR) 5 MG tablet, Take 1 tablet (5 mg total) by mouth daily., Disp: 90 tablet, Rfl: 3  Not on File   Review of Systems  Constitutional: Positive for weight loss (desired). Negative for chills, fever and malaise/fatigue.  HENT: Negative for hearing loss.   Eyes: Negative for blurred vision and double vision.  Respiratory: Negative for cough, shortness of breath and wheezing.   Cardiovascular: Negative for chest pain, palpitations and leg swelling.  Gastrointestinal: Negative for abdominal pain, blood in stool and heartburn.  Genitourinary: Negative for dysuria, frequency and urgency.  Musculoskeletal: Negative for joint pain and myalgias.  Skin: Negative for rash.  Neurological: Negative for dizziness, tremors, weakness and headaches.      Objective  Vitals:   08/07/16 0828  BP: 137/81  Pulse: 67  Resp: 16  Temp: 98 F (36.7 C)  TempSrc: Oral  Weight: 204 lb (92.5 kg)  Height: 5\' 10"  (1.778 m)    Physical Exam  Constitutional: He is oriented to person, place, and time and well-developed, well-nourished, and in no distress. No distress.  HENT:  Head: Normocephalic and atraumatic.  Eyes: Conjunctivae and EOM are normal. Pupils  are equal, round, and reactive to light. No scleral icterus.  Neck: Normal range of motion. Neck supple. Carotid bruit is not present. No thyromegaly present.  Cardiovascular: Normal rate, regular rhythm and normal heart sounds.  Exam reveals no gallop and no friction rub.   No murmur heard. Pulmonary/Chest: Effort normal and breath sounds normal. No respiratory distress. He has no wheezes. He has no rales.  Abdominal: Soft. Bowel sounds are normal. He exhibits no distension and no mass. There is no tenderness.  Musculoskeletal: He exhibits no edema.  Lymphadenopathy:    He has no cervical adenopathy.  Neurological: He is alert and oriented to person, place, and time.  Vitals reviewed.      No results found for this or any  previous visit (from the past 2160 hour(s)).   Assessment & Plan  Problem List Items Addressed This Visit      Cardiovascular and Mediastinum   Arteriosclerosis of coronary artery   Relevant Medications   ezetimibe (ZETIA) 10 MG tablet   rosuvastatin (CRESTOR) 5 MG tablet   carvedilol (COREG) 3.125 MG tablet   BP (high blood pressure)   Relevant Medications   ezetimibe (ZETIA) 10 MG tablet   rosuvastatin (CRESTOR) 5 MG tablet   carvedilol (COREG) 3.125 MG tablet     Endocrine   Diabetes (HCC)   Relevant Medications   rosuvastatin (CRESTOR) 5 MG tablet     Other   HLD (hyperlipidemia)   Relevant Medications   ezetimibe (ZETIA) 10 MG tablet   rosuvastatin (CRESTOR) 5 MG tablet   carvedilol (COREG) 3.125 MG tablet   Need for influenza vaccination    Other Visit Diagnoses    Diabetes mellitus without complication ([place])    -  Primary   Relevant Medications   rosuvastatin (CRESTOR) 5 MG tablet   Other Relevant Orders   POCT HgB A1C   HM Diabetes Foot Exam (Completed)   Immunization due       Relevant Orders   Flu vaccine HIGH DOSE PF (Fluzone High dose)      Meds ordered this encounter  Medications  . ezetimibe (ZETIA) 10 MG tablet    Sig: Take 1 tablet (10 mg total) by mouth at bedtime.    Dispense:  90 tablet    Refill:  3  . rosuvastatin (CRESTOR) 5 MG tablet    Sig: Take 1 tablet (5 mg total) by mouth daily.    Dispense:  90 tablet    Refill:  3  . carvedilol (COREG) 3.125 MG tablet    Sig: Take 1 tablet (3.125 mg total) by mouth 2 (two) times daily with a meal.    Dispense:  180 tablet    Refill:  3   1. Diabetes mellitus without complication ([place])  - POCT HgB A1C-6.4 - HM Diabetes Foot Exam  2. Essential hypertension  - carvedilol (COREG) 3.125 MG tablet; Take 1 tablet (3.125 mg total) by mouth 2 (two) times daily with a meal.  Dispense: 180 tablet; Refill: 3  3. Diabetes mellitus due to underlying condition with hyperosmolarity without coma,  without long-term current use of insulin (HCC)  Cont to watch diet. 4. Arteriosclerosis of coronary artery  - carvedilol (COREG) 3.125 MG tablet; Take 1 tablet (3.125 mg total) by mouth 2 (two) times daily with a meal.  Dispense: 180 tablet; Refill: 3  5. HLD (hyperlipidemia)  - ezetimibe (ZETIA) 10 MG tablet; Take 1 tablet (10 mg total) by mouth at bedtime.  Dispense: 90 tablet; Refill:  3 - rosuvastatin (CRESTOR) 5 MG tablet; Take 1 tablet (5 mg total) by mouth daily.  Dispense: 90 tablet; Refill: 3- Take 1 tablet twice a week.  6. Need for influenza vaccination   7. Immunization due  - Flu vaccine HIGH DOSE PF (Fluzone High dose)

## 2016-08-11 DIAGNOSIS — I10 Essential (primary) hypertension: Secondary | ICD-10-CM | POA: Diagnosis not present

## 2016-08-11 DIAGNOSIS — I2510 Atherosclerotic heart disease of native coronary artery without angina pectoris: Secondary | ICD-10-CM | POA: Diagnosis not present

## 2016-08-11 DIAGNOSIS — E782 Mixed hyperlipidemia: Secondary | ICD-10-CM | POA: Diagnosis not present

## 2016-09-03 DIAGNOSIS — L905 Scar conditions and fibrosis of skin: Secondary | ICD-10-CM | POA: Diagnosis not present

## 2016-09-03 DIAGNOSIS — D0439 Carcinoma in situ of skin of other parts of face: Secondary | ICD-10-CM | POA: Diagnosis not present

## 2016-12-09 DIAGNOSIS — L821 Other seborrheic keratosis: Secondary | ICD-10-CM | POA: Diagnosis not present

## 2016-12-09 DIAGNOSIS — D225 Melanocytic nevi of trunk: Secondary | ICD-10-CM | POA: Diagnosis not present

## 2016-12-09 DIAGNOSIS — Z08 Encounter for follow-up examination after completed treatment for malignant neoplasm: Secondary | ICD-10-CM | POA: Diagnosis not present

## 2016-12-09 DIAGNOSIS — Z85828 Personal history of other malignant neoplasm of skin: Secondary | ICD-10-CM | POA: Diagnosis not present

## 2016-12-10 ENCOUNTER — Encounter: Payer: Self-pay | Admitting: *Deleted

## 2016-12-11 ENCOUNTER — Encounter: Payer: Self-pay | Admitting: Family Medicine

## 2016-12-11 ENCOUNTER — Ambulatory Visit (INDEPENDENT_AMBULATORY_CARE_PROVIDER_SITE_OTHER): Payer: Medicare Other | Admitting: Family Medicine

## 2016-12-11 VITALS — BP 133/88 | HR 60 | Temp 97.8°F | Resp 16 | Ht 70.0 in | Wt 205.0 lb

## 2016-12-11 DIAGNOSIS — E7849 Other hyperlipidemia: Secondary | ICD-10-CM

## 2016-12-11 DIAGNOSIS — E0800 Diabetes mellitus due to underlying condition with hyperosmolarity without nonketotic hyperglycemic-hyperosmolar coma (NKHHC): Secondary | ICD-10-CM

## 2016-12-11 DIAGNOSIS — E119 Type 2 diabetes mellitus without complications: Secondary | ICD-10-CM | POA: Diagnosis not present

## 2016-12-11 DIAGNOSIS — I10 Essential (primary) hypertension: Secondary | ICD-10-CM | POA: Diagnosis not present

## 2016-12-11 DIAGNOSIS — Z23 Encounter for immunization: Secondary | ICD-10-CM

## 2016-12-11 DIAGNOSIS — I2510 Atherosclerotic heart disease of native coronary artery without angina pectoris: Secondary | ICD-10-CM | POA: Diagnosis not present

## 2016-12-11 DIAGNOSIS — E784 Other hyperlipidemia: Secondary | ICD-10-CM

## 2016-12-11 LAB — COMPLETE METABOLIC PANEL WITH GFR
ALBUMIN: 4.6 g/dL (ref 3.6–5.1)
ALK PHOS: 55 U/L (ref 40–115)
ALT: 21 U/L (ref 9–46)
AST: 22 U/L (ref 10–35)
BUN: 22 mg/dL (ref 7–25)
CALCIUM: 9.7 mg/dL (ref 8.6–10.3)
CO2: 26 mmol/L (ref 20–31)
CREATININE: 1.06 mg/dL (ref 0.70–1.18)
Chloride: 105 mmol/L (ref 98–110)
GFR, EST AFRICAN AMERICAN: 81 mL/min (ref >=60)
GFR, Est Non African American: 70 mL/min (ref >=60)
Glucose, Bld: 120 mg/dL — ABNORMAL HIGH (ref 65–99)
POTASSIUM: 4.5 mmol/L (ref 3.5–5.3)
Sodium: 141 mmol/L (ref 135–146)
Total Bilirubin: 0.8 mg/dL (ref 0.2–1.2)
Total Protein: 7.3 g/dL (ref 6.1–8.1)

## 2016-12-11 LAB — LIPID PANEL
CHOLESTEROL: 155 mg/dL (ref <200)
HDL: 54 mg/dL (ref >40)
LDL CALC: 76 mg/dL (ref <100)
TRIGLYCERIDES: 124 mg/dL (ref <150)
Total CHOL/HDL Ratio: 2.9 Ratio (ref <5.0)
VLDL: 25 mg/dL (ref <30)

## 2016-12-11 LAB — CBC WITH DIFFERENTIAL/PLATELET
Basophils Absolute: 56 cells/uL (ref 0–200)
Basophils Relative: 1 %
EOS PCT: 3 %
Eosinophils Absolute: 168 cells/uL (ref 15–500)
HCT: 46.6 % (ref 38.5–50.0)
Hemoglobin: 15.9 g/dL (ref 13.2–17.1)
LYMPHS PCT: 27 %
Lymphs Abs: 1512 cells/uL (ref 850–3900)
MCH: 30.8 pg (ref 27.0–33.0)
MCHC: 34.1 g/dL (ref 32.0–36.0)
MCV: 90.3 fL (ref 80.0–100.0)
MPV: 10.8 fL (ref 7.5–12.5)
Monocytes Absolute: 616 cells/uL (ref 200–950)
Monocytes Relative: 11 %
NEUTROS PCT: 58 %
Neutro Abs: 3248 cells/uL (ref 1500–7800)
PLATELETS: 156 10*3/uL (ref 140–400)
RBC: 5.16 MIL/uL (ref 4.20–5.80)
RDW: 13.7 % (ref 11.0–15.0)
WBC: 5.6 10*3/uL (ref 3.8–10.8)

## 2016-12-11 LAB — POCT GLYCOSYLATED HEMOGLOBIN (HGB A1C): Hemoglobin A1C: 6.3

## 2016-12-11 MED ORDER — LISINOPRIL 2.5 MG PO TABS: 2.5000 mg | ORAL_TABLET | Freq: Every day | ORAL | 3 refills | Status: DC

## 2016-12-11 NOTE — Progress Notes (Signed)
Name: Corey Daniel   MRN: [id number]    DOB: June 30, 1946   Date:12/11/2016       Progress Note  Subjective  Chief Complaint  Chief Complaint  Patient presents with  . Hypertension  . Hyperlipidemia  . Diabetes    HPI Here for f/u of DM, HBP and elevated lipids.  He is taking all meds.  He feels well overall.  No problem-specific Assessment & Plan notes found for this encounter.   Past Medical History:  Diagnosis Date  . ASCVD (arteriosclerotic cardiovascular disease)   . Back pain    with radiation  . Diabetes mellitus without complication ([place])   . Hyperlipidemia   . Hypertension   . Lower leg pain     Past Surgical History:  Procedure Laterality Date  . BACK SURGERY    . CORONARY ARTERY BYPASS GRAFT      Family History  Problem Relation Age of Onset  . Heart disease Mother   . Coarctation of the aorta Brother     Social History   Social History  . Marital status: Married    Spouse name: N/A  . Number of children: N/A  . Years of education: N/A   Occupational History  . Not on file.   Social History Main Topics  . Smoking status: Former Research scientist (life sciences)  . Smokeless tobacco: Never Used  . Alcohol use No  . Drug use: No  . Sexual activity: Not on file   Other Topics Concern  . Not on file   Social History Narrative  . No narrative on file     Current Outpatient Prescriptions:  .  aspirin 81 MG chewable tablet, Chew by mouth., Disp: , Rfl:  .  carvedilol (COREG) 3.125 MG tablet, Take 1 tablet (3.125 mg total) by mouth 2 (two) times daily with a meal., Disp: 180 tablet, Rfl: 3 .  Cholecalciferol (VITAMIN D3) 2000 UNITS capsule, Take by mouth., Disp: , Rfl:  .  Coenzyme Q10 100 MG capsule, Take by mouth., Disp: , Rfl:  .  ezetimibe (ZETIA) 10 MG tablet, Take 1 tablet (10 mg total) by mouth at bedtime., Disp: 90 tablet, Rfl: 3 .  Fish Oil-Cholecalciferol (FISH OIL + D3) 1000-1000 MG-UNIT CAPS, Take by mouth., Disp: , Rfl:  .  rosuvastatin (CRESTOR) 5 MG  tablet, Take 1 tablet (5 mg total) by mouth daily. (Patient taking differently: Take 5 mg by mouth daily. Twice weekly), Disp: 90 tablet, Rfl: 3 .  lisinopril (ZESTRIL) 2.5 MG tablet, Take 1 tablet (2.5 mg total) by mouth daily., Disp: 90 tablet, Rfl: 3  Not on File   Review of Systems  Constitutional: Negative for chills, fever, malaise/fatigue and weight loss.  HENT: Negative for hearing loss and tinnitus.   Eyes: Negative for blurred vision and double vision.  Respiratory: Negative for cough, shortness of breath and wheezing.   Cardiovascular: Negative for chest pain, palpitations and leg swelling.  Gastrointestinal: Negative for abdominal pain, blood in stool and heartburn.  Genitourinary: Negative for dysuria, frequency and urgency.  Skin: Negative for rash.  Neurological: Negative for dizziness, tingling, tremors, weakness and headaches.      Objective  Vitals:   12/11/16 0816  BP: 133/88  Pulse: 60  Resp: 16  Temp: 97.8 F (36.6 C)  TempSrc: Oral  Weight: 205 lb (93 kg)  Height: 5\' 10"  (1.778 m)    Physical Exam  Constitutional: He is oriented to person, place, and time and well-developed, well-nourished, and in no distress. No  distress.  HENT:  Head: Normocephalic and atraumatic.  Eyes: Conjunctivae and EOM are normal. Pupils are equal, round, and reactive to light. No scleral icterus.  Neck: Normal range of motion. Neck supple. Carotid bruit is not present. No thyromegaly present.  Cardiovascular: Normal rate, regular rhythm and normal heart sounds.  Exam reveals no gallop and no friction rub.   No murmur heard. Pulmonary/Chest: Effort normal and breath sounds normal. No respiratory distress. He has no wheezes. He has no rales.  Abdominal: Soft. Bowel sounds are normal. He exhibits no distension, no abdominal bruit and no mass. There is no tenderness.  Musculoskeletal: He exhibits no edema.  Lymphadenopathy:    He has no cervical adenopathy.  Neurological: He  is alert and oriented to person, place, and time.  Vitals reviewed.      Recent Results (from the past 2160 hour(s))  POCT HgB A1C     Status: Abnormal   Collection Time: 12/11/16  8:43 AM  Result Value Ref Range   Hemoglobin A1C 6.3%      Assessment & Plan  Problem List Items Addressed This Visit      Cardiovascular and Mediastinum   Arteriosclerosis of coronary artery   Relevant Medications   lisinopril (ZESTRIL) 2.5 MG tablet   BP (high blood pressure)   Relevant Medications   lisinopril (ZESTRIL) 2.5 MG tablet   Other Relevant Orders   COMPLETE METABOLIC PANEL WITH GFR   CBC with Differential     Endocrine   Diabetes (HCC)   Relevant Medications   lisinopril (ZESTRIL) 2.5 MG tablet     Other   HLD (hyperlipidemia)   Relevant Medications   lisinopril (ZESTRIL) 2.5 MG tablet   Other Relevant Orders   Lipid Profile    Other Visit Diagnoses    Need for vaccination    -  Primary   Diabetes mellitus without complication ([place])       Relevant Medications   lisinopril (ZESTRIL) 2.5 MG tablet   Other Relevant Orders   POCT HgB A1C (Completed)      Meds ordered this encounter  Medications  . lisinopril (ZESTRIL) 2.5 MG tablet    Sig: Take 1 tablet (2.5 mg total) by mouth daily.    Dispense:  90 tablet    Refill:  3      2. Diabetes mellitus without complication ([place])  - POCT HgB A1C-6.3  3. Essential hypertension  - lisinopril (ZESTRIL) 2.5 MG tablet; Take 1 tablet (2.5 mg total) by mouth daily.  Dispense: 90 tablet; Refill: 3 - COMPLETE METABOLIC PANEL WITH GFR - CBC with Differential  4. Arteriosclerosis of coronary artery Cont Coreg  5. Diabetes mellitus due to underlying condition with hyperosmolarity without coma, without long-term current use of insulin (HCC)   6. Other hyperlipidemia Cont Zetia and Crestor - Lipid Profile

## 2016-12-15 ENCOUNTER — Other Ambulatory Visit: Payer: Self-pay | Admitting: *Deleted

## 2016-12-15 DIAGNOSIS — E785 Hyperlipidemia, unspecified: Secondary | ICD-10-CM

## 2016-12-15 MED ORDER — ROSUVASTATIN CALCIUM 5 MG PO TABS: 5.0000 mg | ORAL_TABLET | Freq: Every day | ORAL | 0 refills | Status: DC

## 2017-03-05 DIAGNOSIS — E782 Mixed hyperlipidemia: Secondary | ICD-10-CM | POA: Diagnosis not present

## 2017-03-05 DIAGNOSIS — I10 Essential (primary) hypertension: Secondary | ICD-10-CM | POA: Diagnosis not present

## 2017-03-05 DIAGNOSIS — I2510 Atherosclerotic heart disease of native coronary artery without angina pectoris: Secondary | ICD-10-CM | POA: Diagnosis not present

## 2017-03-18 DIAGNOSIS — I2510 Atherosclerotic heart disease of native coronary artery without angina pectoris: Secondary | ICD-10-CM | POA: Diagnosis not present

## 2017-03-24 DIAGNOSIS — E782 Mixed hyperlipidemia: Secondary | ICD-10-CM | POA: Diagnosis not present

## 2017-03-24 DIAGNOSIS — I10 Essential (primary) hypertension: Secondary | ICD-10-CM | POA: Diagnosis not present

## 2017-03-24 DIAGNOSIS — I2510 Atherosclerotic heart disease of native coronary artery without angina pectoris: Secondary | ICD-10-CM | POA: Diagnosis not present

## 2017-04-21 ENCOUNTER — Encounter: Payer: Self-pay | Admitting: Family Medicine

## 2017-04-21 ENCOUNTER — Ambulatory Visit (INDEPENDENT_AMBULATORY_CARE_PROVIDER_SITE_OTHER): Payer: Medicare Other | Admitting: Family Medicine

## 2017-04-21 ENCOUNTER — Other Ambulatory Visit: Payer: Self-pay | Admitting: Family Medicine

## 2017-04-21 VITALS — BP 114/74 | HR 74 | Temp 97.9°F | Resp 16 | Ht 70.0 in | Wt 212.0 lb

## 2017-04-21 DIAGNOSIS — R7303 Prediabetes: Secondary | ICD-10-CM

## 2017-04-21 DIAGNOSIS — I10 Essential (primary) hypertension: Secondary | ICD-10-CM | POA: Diagnosis not present

## 2017-04-21 DIAGNOSIS — I2510 Atherosclerotic heart disease of native coronary artery without angina pectoris: Secondary | ICD-10-CM | POA: Diagnosis not present

## 2017-04-21 DIAGNOSIS — E785 Hyperlipidemia, unspecified: Secondary | ICD-10-CM | POA: Diagnosis not present

## 2017-04-21 DIAGNOSIS — Z125 Encounter for screening for malignant neoplasm of prostate: Secondary | ICD-10-CM

## 2017-04-21 DIAGNOSIS — K219 Gastro-esophageal reflux disease without esophagitis: Secondary | ICD-10-CM | POA: Diagnosis not present

## 2017-04-21 DIAGNOSIS — Z0000 Encounter for general adult medical examination without abnormal findings: Secondary | ICD-10-CM

## 2017-04-21 DIAGNOSIS — E782 Mixed hyperlipidemia: Secondary | ICD-10-CM | POA: Diagnosis not present

## 2017-04-21 DIAGNOSIS — Z862 Personal history of diseases of the blood and blood-forming organs and certain disorders involving the immune mechanism: Secondary | ICD-10-CM

## 2017-04-21 LAB — POCT GLYCOSYLATED HEMOGLOBIN (HGB A1C): Hemoglobin A1C: 6.5 — AB (ref ?–5.7)

## 2017-04-21 MED ORDER — LISINOPRIL 2.5 MG PO TABS: 2.5000 mg | ORAL_TABLET | Freq: Every day | ORAL | 3 refills | Status: DC

## 2017-04-21 MED ORDER — ROSUVASTATIN CALCIUM 5 MG PO TABS: 5.0000 mg | ORAL_TABLET | ORAL | 3 refills | Status: DC

## 2017-04-21 MED ORDER — CARVEDILOL 3.125 MG PO TABS: 3.1250 mg | ORAL_TABLET | Freq: Two times a day (BID) | ORAL | 3 refills | Status: DC

## 2017-04-21 MED ORDER — EZETIMIBE 10 MG PO TABS: 10.0000 mg | ORAL_TABLET | Freq: Every day | ORAL | 3 refills | Status: DC

## 2017-04-21 NOTE — Patient Instructions (Addendum)
Thank you for coming to the clinic today.  1. A1c elevated up to 6.5  Concern for progression to Type 2 Diabetes  Very important to work on lifestyle changes - low carb, low sugar diet, drink mostly water, increase regular exercise  Eat at least 3 meals and 1-2 snacks per day (don't skip breakfast).  Aim for no more than 5 hours between eating. - Tip: If you go >5 hours without eating and become very hungry, your body will supply it's own resources temporarily and you can gain extra weight when you eat.  The 5 Minute Rule of Exercise - Promise yourself to at least do 5 minutes of exercise (make sure you time it), and if at the end of 5 minutes (this is the hardest part of the work-out), if you still feel like you want stop (or not motivated to continue) then allow yourself to stop. Otherwise, more often than not you will feel encouraged that you can continue for a little while longer or even more!  Diet Recommendations for Diabetes   Reduce Starchy (carb) foods include: Bread, rice, pasta, potatoes, corn, crackers, bagels, muffins, all baked goods.   Continue to eat Protein foods: Meat, fish, poultry, eggs, dairy foods, and beans such as pinto and kidney beans (beans also provide carbohydrate).   1. Eat at least 3 meals and 1-2 snacks per day. Never go more than 4-5 hours while awake without eating.   2. Limit starchy foods to TWO per meal and ONE per snack. ONE portion of a starchy  food is equal to the following:   - ONE slice of bread (or its equivalent, such as half of a hamburger bun).   - 1/2 cup of a "scoopable" starchy food such as potatoes or rice.   - 1 OUNCE (28 grams) of starchy snacks (crackers or pretzels, look on label).   - 15 grams of carbohydrate as shown on food label.   3. Both lunch and dinner should include a protein food, a carb food, and vegetables.   - Obtain twice as many veg's as protein or carbohydrate foods for both lunch and dinner.   - Try to keep frozen  veg's on hand for a quick vegetable serving.     - Fresh or frozen veg's are best.   4. Breakfast should always include protein.    -------------------------- Refilled all medicines, 90 day supply to [facility] - try to take Rosuvastatin 3 x weekly if possible  ---------------  Your symptoms sound most consistent with GERD or acid reflux  You may start with TUMs, or other antacid over the counter to help soothe the stomach and allow it to heal  If not improved, then may try to Zantac/ Ranitidine 75 or 150mg  twice daily with food, same time every day for at least 2 weeks, max treatment up to 4 weeks then stop. If it resolves but then comes back again, you can repeat the 2-4 week course again as needed. - Avoid spicy, greasy, fried foods, also things like caffeine, dark chocolate, peppermint can worsen - Avoid large meals and late night snacks, also do not go more than 4-5 hours without a snack or meal (not eating will worsen reflux symptoms due to stomach acid)  If the problem improves but keeps coming back, we can discuss higher dose or longer course at next visit.  If symptoms are worsening, persistent symptoms despite treatment or develop esophageal or abdominal pain, unable to swallow solids or liquids, nausea,  vomiting, fever/chills, or unintentional weight loss / no appetite, please follow-up sooner or seek more immediate medical attention.  You will be due for FASTING BLOOD WORK (no food or drink after midnight before, only water or coffee without cream/sugar on the morning of)  - Please go ahead and schedule a "Lab Only" visit in the morning at the clinic for lab draw in 3 months  For Lab Results, once available within 2-3 days of blood draw, you can can log in to MyChart online to view your results and a brief explanation. Also, we can discuss results at next follow-up visit.  Please schedule a Follow-up Appointment to: Return in about 3 months (around 07/22/2017) for Pre-DM,  HTN, HLD, GERD.  If you have any other questions or concerns, please feel free to call the clinic or send a message through [place]. You may also schedule an earlier appointment if necessary.  [name], DO [place]

## 2017-04-21 NOTE — Progress Notes (Signed)
Subjective:    Patient ID: Corey Daniel, male    DOB: [birth year], 71 y.o.   MRN: [id number]  Corey Daniel is a 71 y.o. male presenting on 04/21/2017 for Hypertension   HPI   Pre-Diabetes: Reports no concerns CBGs: Not checking CBG Meds: None (previousy on Metformin 1-2 years ago, stopped due to DM well controlled) Currently on ACEi Lifestyle: - Diet (Admits no longer following healthy lifestyle/diet. He was previously eating low carb, low sugar diet but then stopped this >1 year ago)  - Exercise (Yard work occasionally, mowing grass, no regular exercise) Denies hypoglycemia, polyuria, visual changes, numbness or tingling.  CHRONIC HTN: Reports he does not check BP regularly Current Meds - Coreg 3.125mg  BID, Lisinopril 2.5mg    Reports good compliance, took meds today. Tolerating well, w/o complaints. Denies CP, dyspnea, HA, edema, dizziness / lightheadedness  HYPERLIPIDEMIA: - Reports prior elevated cholesterol in past, last lipid panel 11/2016, improved results, had previously had increased TG, and LDL - Currently taking Zetia 10mg  and Rosuvastatin 5mg  twice weekly, tolerating well without significant joint and muscle pain, but seems like he has very mild symptoms from this on minimal - Taking baby ASA 81mg  daily, for secondary prevention  GERD / Pre-Prandial Abdominal Discomfort / Pain Recently memorial day weekend, hungry had heartburn epigastric pain, then now when hungry more lower abdominal pain. Describes pain as more aching, cramping pain, had some burning of esophagus. If less active less pain, and also it is intermittent, not everyday. - He had been taking Advil PM recently, and this is only new change - Denies unintentional weight loss, dark stool or blood in stool, change in bowel habits, early satiety, anorexia or loss of appetite, nausea, vomiting  PMH - CAD s/p CABG (1997) - Followed by Cardiology (Dr Ubaldo Glassing)  Social History  Substance Use Topics  . Smoking  status: Former Research scientist (life sciences)  . Smokeless tobacco: Never Used  . Alcohol use No    Review of Systems Per HPI unless specifically indicated above     Objective:    BP 114/74 (BP Location: Left Arm, Cuff Size: Normal)   Pulse 74   Temp 97.9 F (36.6 C) (Oral)   Resp 16   Ht 5\' 10"  (1.778 m)   Wt 212 lb (96.2 kg)   BMI 30.42 kg/m   Wt Readings from Last 3 Encounters:  04/21/17 212 lb (96.2 kg)  12/11/16 205 lb (93 kg)  08/07/16 204 lb (92.5 kg)    Physical Exam  Constitutional: He is oriented to person, place, and time. He appears well-developed and well-nourished. No distress.  Well-appearing, comfortable, cooperative  HENT:  Head: Normocephalic and atraumatic.  Mouth/Throat: Oropharynx is clear and moist.  Eyes: Conjunctivae are normal. Right eye exhibits no discharge. Left eye exhibits no discharge.  Neck: Normal range of motion. Neck supple. No thyromegaly present.  No carotid bruits  Cardiovascular: Normal rate, regular rhythm, normal heart sounds and intact distal pulses.   No murmur heard. Pulmonary/Chest: Effort normal and breath sounds normal. No respiratory distress. He has no wheezes. He has no rales.  Abdominal: Soft. Bowel sounds are normal. He exhibits no distension and no mass. There is no tenderness. There is no rebound.  Musculoskeletal: He exhibits no edema.  Lymphadenopathy:    He has no cervical adenopathy.  Neurological: He is alert and oriented to person, place, and time.  Skin: Skin is warm and dry. No rash noted. He is not diaphoretic. No erythema.  Psychiatric: He has a normal  mood and affect. His behavior is normal.  Well groomed, good eye contact, normal speech and thoughts  Nursing note and vitals reviewed.  Results for orders placed or performed in visit on 04/21/17  POCT HgB A1C  Result Value Ref Range   Hemoglobin A1C 6.5 (A) 5.7      Assessment & Plan:   Problem List Items Addressed This Visit    Pre-diabetes - Primary    Worsening  control Pre-DM with A1c 6.5 today (increased from prior 6.0 to 6.4) Concern with HTN, HLD, CAD  Plan:  1. Not on any therapy currently (off metformin 1-2 yr), Not interested in resuming this today 2. Encourage improved lifestyle - emphasized importance of diet - low carb, low sugar diet, reduce portion size, start regular exercise 3. Follow-up 3 months PreDM A1c - if 6.5 or higher will change new dx to Diabetes, anticipate start medication next visit if still elevated overall likely back on metformin      Relevant Orders   POCT HgB A1C (Completed)   HLD (hyperlipidemia)    Controlled cholesterol on statin and zetia. Some worsening lifestyle habits. Last lipid panel 11/2016 - improved ASCVD risk is elevated with known CAD s/p CABG  Plan: 1. Continue current meds - Rosuvastatin 5mg  (was twice weekly, advised to increase to 3x weekly if tolerated well), Zetia 10mg  daily 2. Continue ASA 81mg  for secondary ASCVD risk reduction 3. Encourage improved lifestyle - low carb/cholesterol, reduce portion size, start regular exercise 4. Follow-up 3 months - yearly fasting lipids      Relevant Medications   carvedilol (COREG) 3.125 MG tablet   ezetimibe (ZETIA) 10 MG tablet   lisinopril (ZESTRIL) 2.5 MG tablet   rosuvastatin (CRESTOR) 5 MG tablet   Essential hypertension    Mildly elevated initial BP, repeat manual check improved to well within normal range 110s/70. - Home BP readings not available  Complicated by CAD   Plan:  1. Continue current BP regimen Coreg 3.125mg  BID, Lisinopril 2.5mg  2. Encourage improved lifestyle - start low sodium diet, regular exercise 3. Start monitor BP outside office, bring readings to next visit, if persistently >140/90 or new symptoms notify office sooner 4. Follow-up 3 months      Relevant Medications   carvedilol (COREG) 3.125 MG tablet   ezetimibe (ZETIA) 10 MG tablet   lisinopril (ZESTRIL) 2.5 MG tablet   rosuvastatin (CRESTOR) 5 MG tablet    Arteriosclerosis of coronary artery    Stable without anginal symptoms CAD s/p CABG 1997 Followed by Cardiology Dr Ubaldo Glassing      Relevant Medications   carvedilol (COREG) 3.125 MG tablet   ezetimibe (ZETIA) 10 MG tablet   lisinopril (ZESTRIL) 2.5 MG tablet   rosuvastatin (CRESTOR) 5 MG tablet    Other Visit Diagnoses    Gastroesophageal reflux disease, esophagitis presence not specified      Suspected acute flare recent intake of Advil PM as possible trigger, otherwise no clear cause, without known history of GERD - No GI red flag symptoms. Exam is unremarkable with benign abdomen without pain today - No prior history of GERD, no regular treatments with H2 blocker or PPI - History not suggestive of PUD  Plan: 1. Reassurance, non specific abdominal symptoms - start with OTC TUMS or other antacids first and stop Advil PM NSAIDs 2. Next can do trial on OTC Zantac /Ranitidine up to 150mg  BID for 2 to 4 weeks, may need repeat course in future if recurrence 3. Diet modifications reduce GERD  4. Follow-up 4-6 weeks as needed sooner otherwise 3 months, may need 2 week trial on PPI       Meds ordered this encounter  Medications  . carvedilol (COREG) 3.125 MG tablet    Sig: Take 1 tablet (3.125 mg total) by mouth 2 (two) times daily with a meal.    Dispense:  180 tablet    Refill:  3  . ezetimibe (ZETIA) 10 MG tablet    Sig: Take 1 tablet (10 mg total) by mouth at bedtime.    Dispense:  90 tablet    Refill:  3  . lisinopril (ZESTRIL) 2.5 MG tablet    Sig: Take 1 tablet (2.5 mg total) by mouth daily.    Dispense:  90 tablet    Refill:  3  . rosuvastatin (CRESTOR) 5 MG tablet    Sig: Take 1 tablet (5 mg total) by mouth 3 (three) times a week. 3 times weekly    Dispense:  36 tablet    Refill:  3     Follow up plan: Return in about 3 months (around 07/22/2017) for Pre-DM, HTN, HLD, GERD.  [name], [facility] 04/22/2017, 5:43  AM

## 2017-04-22 NOTE — Assessment & Plan Note (Signed)
Stable without anginal symptoms CAD s/p CABG 1997 Followed by Cardiology Dr Ubaldo Glassing

## 2017-04-22 NOTE — Assessment & Plan Note (Signed)
Controlled cholesterol on statin and zetia. Some worsening lifestyle habits. Last lipid panel 11/2016 - improved ASCVD risk is elevated with known CAD s/p CABG  Plan: 1. Continue current meds - Rosuvastatin 5mg  (was twice weekly, advised to increase to 3x weekly if tolerated well), Zetia 10mg  daily 2. Continue ASA 81mg  for secondary ASCVD risk reduction 3. Encourage improved lifestyle - low carb/cholesterol, reduce portion size, start regular exercise 4. Follow-up 3 months - yearly fasting lipids

## 2017-04-22 NOTE — Assessment & Plan Note (Signed)
Worsening control Pre-DM with A1c 6.5 today (increased from prior 6.0 to 6.4) Concern with HTN, HLD, CAD  Plan:  1. Not on any therapy currently (off metformin 1-2 yr), Not interested in resuming this today 2. Encourage improved lifestyle - emphasized importance of diet - low carb, low sugar diet, reduce portion size, start regular exercise 3. Follow-up 3 months PreDM A1c - if 6.5 or higher will change new dx to Diabetes, anticipate start medication next visit if still elevated overall likely back on metformin

## 2017-04-22 NOTE — Assessment & Plan Note (Addendum)
Mildly elevated initial BP, repeat manual check improved to well within normal range 110s/70. - Home BP readings not available  Complicated by CAD   Plan:  1. Continue current BP regimen Coreg 3.125mg  BID, Lisinopril 2.5mg  2. Encourage improved lifestyle - start low sodium diet, regular exercise 3. Start monitor BP outside office, bring readings to next visit, if persistently >140/90 or new symptoms notify office sooner 4. Follow-up 3 months

## 2017-05-17 DIAGNOSIS — R1013 Epigastric pain: Secondary | ICD-10-CM | POA: Diagnosis not present

## 2017-05-17 DIAGNOSIS — J069 Acute upper respiratory infection, unspecified: Secondary | ICD-10-CM | POA: Diagnosis not present

## 2017-05-27 DIAGNOSIS — I10 Essential (primary) hypertension: Secondary | ICD-10-CM | POA: Diagnosis not present

## 2017-05-27 DIAGNOSIS — D509 Iron deficiency anemia, unspecified: Secondary | ICD-10-CM | POA: Diagnosis not present

## 2017-05-27 DIAGNOSIS — R1013 Epigastric pain: Secondary | ICD-10-CM | POA: Diagnosis not present

## 2017-06-02 ENCOUNTER — Telehealth: Payer: Self-pay | Admitting: Family Medicine

## 2017-06-02 NOTE — Telephone Encounter (Signed)
Called pt to schedule Annual Wellness Visit with NHA  - knb  °

## 2017-06-17 HISTORY — PX: COLONOSCOPY: SHX174

## 2017-06-17 HISTORY — PX: UPPER GI ENDOSCOPY: SHX6162

## 2017-06-23 DIAGNOSIS — K2950 Unspecified chronic gastritis without bleeding: Secondary | ICD-10-CM | POA: Diagnosis not present

## 2017-06-23 DIAGNOSIS — K635 Polyp of colon: Secondary | ICD-10-CM | POA: Diagnosis not present

## 2017-06-23 DIAGNOSIS — K259 Gastric ulcer, unspecified as acute or chronic, without hemorrhage or perforation: Secondary | ICD-10-CM | POA: Diagnosis not present

## 2017-06-23 DIAGNOSIS — K3189 Other diseases of stomach and duodenum: Secondary | ICD-10-CM | POA: Diagnosis not present

## 2017-06-23 DIAGNOSIS — D127 Benign neoplasm of rectosigmoid junction: Secondary | ICD-10-CM | POA: Diagnosis not present

## 2017-06-23 DIAGNOSIS — D124 Benign neoplasm of descending colon: Secondary | ICD-10-CM | POA: Diagnosis not present

## 2017-06-23 DIAGNOSIS — K2970 Gastritis, unspecified, without bleeding: Secondary | ICD-10-CM | POA: Diagnosis not present

## 2017-06-23 DIAGNOSIS — D123 Benign neoplasm of transverse colon: Secondary | ICD-10-CM | POA: Diagnosis not present

## 2017-06-23 DIAGNOSIS — B9681 Helicobacter pylori [H. pylori] as the cause of diseases classified elsewhere: Secondary | ICD-10-CM | POA: Diagnosis not present

## 2017-06-23 DIAGNOSIS — D371 Neoplasm of uncertain behavior of stomach: Secondary | ICD-10-CM | POA: Diagnosis not present

## 2017-06-23 DIAGNOSIS — D509 Iron deficiency anemia, unspecified: Secondary | ICD-10-CM | POA: Diagnosis not present

## 2017-06-23 DIAGNOSIS — C8339 Diffuse large B-cell lymphoma, extranodal and solid organ sites: Secondary | ICD-10-CM | POA: Diagnosis not present

## 2017-06-23 LAB — HM COLONOSCOPY

## 2017-07-09 ENCOUNTER — Telehealth: Payer: Self-pay | Admitting: Family Medicine

## 2017-07-09 NOTE — Telephone Encounter (Signed)
Called pt to schedule for Annual Wellness Visit with Nurse Health Advisor, Tiffany Hill, my c/b # is [phone]  Corey Daniel ° °

## 2017-07-17 ENCOUNTER — Other Ambulatory Visit: Payer: Medicare Other

## 2017-07-17 DIAGNOSIS — Z125 Encounter for screening for malignant neoplasm of prostate: Secondary | ICD-10-CM | POA: Diagnosis not present

## 2017-07-17 DIAGNOSIS — Z79899 Other long term (current) drug therapy: Secondary | ICD-10-CM | POA: Diagnosis not present

## 2017-07-17 DIAGNOSIS — Z862 Personal history of diseases of the blood and blood-forming organs and certain disorders involving the immune mechanism: Secondary | ICD-10-CM | POA: Diagnosis not present

## 2017-07-17 DIAGNOSIS — R7303 Prediabetes: Secondary | ICD-10-CM | POA: Diagnosis not present

## 2017-07-17 DIAGNOSIS — E782 Mixed hyperlipidemia: Secondary | ICD-10-CM | POA: Diagnosis not present

## 2017-07-17 DIAGNOSIS — R7309 Other abnormal glucose: Secondary | ICD-10-CM | POA: Diagnosis not present

## 2017-07-17 DIAGNOSIS — Z0000 Encounter for general adult medical examination without abnormal findings: Secondary | ICD-10-CM | POA: Diagnosis not present

## 2017-07-17 DIAGNOSIS — I10 Essential (primary) hypertension: Secondary | ICD-10-CM | POA: Diagnosis not present

## 2017-07-17 LAB — CBC WITH DIFFERENTIAL/PLATELET
BASOS ABS: 52 {cells}/uL (ref 0–200)
Basophils Relative: 1 %
EOS PCT: 5 %
Eosinophils Absolute: 260 cells/uL (ref 15–500)
HCT: 43.1 % (ref 38.5–50.0)
Hemoglobin: 14.3 g/dL (ref 13.2–17.1)
Lymphocytes Relative: 24 %
Lymphs Abs: 1248 cells/uL (ref 850–3900)
MCH: 29.3 pg (ref 27.0–33.0)
MCHC: 33.2 g/dL (ref 32.0–36.0)
MCV: 88.3 fL (ref 80.0–100.0)
MONOS PCT: 8 %
MPV: 11.3 fL (ref 7.5–12.5)
Monocytes Absolute: 416 cells/uL (ref 200–950)
NEUTROS PCT: 62 %
Neutro Abs: 3224 cells/uL (ref 1500–7800)
PLATELETS: 189 10*3/uL (ref 140–400)
RBC: 4.88 MIL/uL (ref 4.20–5.80)
RDW: 13.8 % (ref 11.0–15.0)
WBC: 5.2 10*3/uL (ref 3.8–10.8)

## 2017-07-18 LAB — COMPLETE METABOLIC PANEL WITH GFR
ALT: 21 U/L (ref 9–46)
AST: 24 U/L (ref 10–35)
Albumin: 4.4 g/dL (ref 3.6–5.1)
Alkaline Phosphatase: 61 U/L (ref 40–115)
BUN: 18 mg/dL (ref 7–25)
CALCIUM: 9.1 mg/dL (ref 8.6–10.3)
CHLORIDE: 107 mmol/L (ref 98–110)
CO2: 22 mmol/L (ref 20–32)
Creat: 1.2 mg/dL — ABNORMAL HIGH (ref 0.70–1.18)
GFR, Est African American: 70 mL/min (ref >=60)
GFR, Est Non African American: 60 mL/min (ref >=60)
Glucose, Bld: 123 mg/dL — ABNORMAL HIGH (ref 65–99)
POTASSIUM: 4.3 mmol/L (ref 3.5–5.3)
Sodium: 141 mmol/L (ref 135–146)
Total Bilirubin: 0.6 mg/dL (ref 0.2–1.2)
Total Protein: 6.8 g/dL (ref 6.1–8.1)

## 2017-07-18 LAB — HEMOGLOBIN A1C
HEMOGLOBIN A1C: 6.2 % — AB (ref <5.7)
MEAN PLASMA GLUCOSE: 131 mg/dL

## 2017-07-18 LAB — LIPID PANEL
CHOL/HDL RATIO: 3.4 ratio (ref <5.0)
CHOLESTEROL: 161 mg/dL (ref <200)
HDL: 48 mg/dL (ref >40)
LDL Cholesterol: 89 mg/dL (ref <100)
TRIGLYCERIDES: 121 mg/dL (ref <150)
VLDL: 24 mg/dL (ref <30)

## 2017-07-21 ENCOUNTER — Encounter: Payer: Self-pay | Admitting: *Deleted

## 2017-07-21 ENCOUNTER — Inpatient Hospital Stay: Payer: Medicare Other

## 2017-07-21 ENCOUNTER — Inpatient Hospital Stay: Payer: Medicare Other | Attending: Internal Medicine | Admitting: Internal Medicine

## 2017-07-21 DIAGNOSIS — Z7689 Persons encountering health services in other specified circumstances: Secondary | ICD-10-CM | POA: Diagnosis not present

## 2017-07-21 DIAGNOSIS — I6523 Occlusion and stenosis of bilateral carotid arteries: Secondary | ICD-10-CM | POA: Diagnosis not present

## 2017-07-21 DIAGNOSIS — I252 Old myocardial infarction: Secondary | ICD-10-CM | POA: Insufficient documentation

## 2017-07-21 DIAGNOSIS — Z7982 Long term (current) use of aspirin: Secondary | ICD-10-CM | POA: Diagnosis not present

## 2017-07-21 DIAGNOSIS — K760 Fatty (change of) liver, not elsewhere classified: Secondary | ICD-10-CM | POA: Insufficient documentation

## 2017-07-21 DIAGNOSIS — M879 Osteonecrosis, unspecified: Secondary | ICD-10-CM | POA: Diagnosis not present

## 2017-07-21 DIAGNOSIS — I700 Atherosclerosis of aorta: Secondary | ICD-10-CM | POA: Insufficient documentation

## 2017-07-21 DIAGNOSIS — M129 Arthropathy, unspecified: Secondary | ICD-10-CM | POA: Diagnosis not present

## 2017-07-21 DIAGNOSIS — B9681 Helicobacter pylori [H. pylori] as the cause of diseases classified elsewhere: Secondary | ICD-10-CM

## 2017-07-21 DIAGNOSIS — C8599 Non-Hodgkin lymphoma, unspecified, extranodal and solid organ sites: Secondary | ICD-10-CM | POA: Diagnosis not present

## 2017-07-21 DIAGNOSIS — Z5112 Encounter for antineoplastic immunotherapy: Secondary | ICD-10-CM | POA: Diagnosis not present

## 2017-07-21 DIAGNOSIS — Z87891 Personal history of nicotine dependence: Secondary | ICD-10-CM | POA: Diagnosis not present

## 2017-07-21 DIAGNOSIS — Z5111 Encounter for antineoplastic chemotherapy: Secondary | ICD-10-CM | POA: Diagnosis not present

## 2017-07-21 DIAGNOSIS — R918 Other nonspecific abnormal finding of lung field: Secondary | ICD-10-CM | POA: Insufficient documentation

## 2017-07-21 DIAGNOSIS — I2510 Atherosclerotic heart disease of native coronary artery without angina pectoris: Secondary | ICD-10-CM

## 2017-07-21 DIAGNOSIS — D509 Iron deficiency anemia, unspecified: Secondary | ICD-10-CM

## 2017-07-21 DIAGNOSIS — E785 Hyperlipidemia, unspecified: Secondary | ICD-10-CM

## 2017-07-21 DIAGNOSIS — I10 Essential (primary) hypertension: Secondary | ICD-10-CM

## 2017-07-21 DIAGNOSIS — Z79899 Other long term (current) drug therapy: Secondary | ICD-10-CM | POA: Diagnosis not present

## 2017-07-21 LAB — CBC WITH DIFFERENTIAL/PLATELET
BASOS ABS: 0.1 10*3/uL (ref 0–0.1)
Basophils Relative: 2 %
EOS PCT: 5 %
Eosinophils Absolute: 0.3 10*3/uL (ref 0–0.7)
HCT: 43.6 % (ref 40.0–52.0)
HEMOGLOBIN: 14.8 g/dL (ref 13.0–18.0)
LYMPHS PCT: 30 %
Lymphs Abs: 1.8 10*3/uL (ref 1.0–3.6)
MCH: 29.7 pg (ref 26.0–34.0)
MCHC: 33.9 g/dL (ref 32.0–36.0)
MCV: 87.6 fL (ref 80.0–100.0)
Monocytes Absolute: 0.7 10*3/uL (ref 0.2–1.0)
Monocytes Relative: 13 %
NEUTROS PCT: 52 %
Neutro Abs: 3.1 10*3/uL (ref 1.4–6.5)
PLATELETS: 170 10*3/uL (ref 150–440)
RBC: 4.98 MIL/uL (ref 4.40–5.90)
RDW: 14 % (ref 11.5–14.5)
WBC: 6 10*3/uL (ref 3.8–10.6)

## 2017-07-21 LAB — COMPREHENSIVE METABOLIC PANEL
ALT: 29 U/L (ref 17–63)
AST: 32 U/L (ref 15–41)
Albumin: 4.4 g/dL (ref 3.5–5.0)
Alkaline Phosphatase: 69 U/L (ref 38–126)
Anion gap: 6 (ref 5–15)
BUN: 15 mg/dL (ref 6–20)
CHLORIDE: 103 mmol/L (ref 101–111)
CO2: 28 mmol/L (ref 22–32)
CREATININE: 1.07 mg/dL (ref 0.61–1.24)
Calcium: 9.2 mg/dL (ref 8.9–10.3)
GFR calc Af Amer: >60 mL/min (ref >60)
Glucose, Bld: 106 mg/dL — ABNORMAL HIGH (ref 65–99)
Potassium: 4.6 mmol/L (ref 3.5–5.1)
SODIUM: 137 mmol/L (ref 135–145)
Total Bilirubin: 0.7 mg/dL (ref 0.3–1.2)
Total Protein: 7.9 g/dL (ref 6.5–8.1)

## 2017-07-21 LAB — LACTATE DEHYDROGENASE: LDH: 173 U/L (ref 98–192)

## 2017-07-21 LAB — PSA, TOTAL WITH REFLEX TO PSA, FREE: PSA, TOTAL: 3.6 ng/mL (ref <=4.0)

## 2017-07-21 NOTE — Assessment & Plan Note (Addendum)
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 Would recommend a PET scan for further evaluation. Discussed the possible need for a bone marrow biopsy.  # Discussed the treatment options include- if limited stage- chemotherapy with R-CHOP 3 followed by radiation versus- if extensive stage- recommend R CHOP 6 cycles.  Discussed the potential side effects including but not limited to-increasing fatigue, nausea vomiting, diarrhea, hair loss, sores in the mouth, increase risk of infection and also neuropathy.   # Also discussed regarding cardiotoxicity; recommend MUGA scan; also recommend MediPort placement. Chemotherapy education.  # H. pylori positive- on treatment through GI  # I spoke to the pathologist on the phone- confirming the above diagnosis. This was extensively discussed the patient's family in detail. We will get labs; hepatitis panel. Discussed that treatment will likely be started within the next 2 weeks or so.   # Patient will follow-up with me in 1 week/to review the results of the PET scan; next plan of care.  Thank you Dr. Benson Norway for allowing me to participate in the care of your pleasant patient. Please do not hesitate to contact me with questions or concerns in the interim.  # 60 minutes face-to-face with the patient discussing the above plan of care; more than 50% of time spent on prognosis/ natural history; counseling and coordination.

## 2017-07-21 NOTE — Progress Notes (Signed)
[facility] NOTE  Patient Care Team: Olin Hauser, DO as PCP - General (Family Medicine)  CHIEF COMPLAINTS/PURPOSE OF CONSULTATION:  Diffuse large B-cell lymphoma  #  Oncology History   # AUG 2018- DIFFUSE LARGE B CELL LYMPHOMA STOMACH-Fundus ;CD-20 pos;variable- bcl6/mum-?? GCB vs. ABC subtype;  [EGD for IDA; Dr.Hung; [place]]- NEG for FISH for;myc; bcl-6; bcl-2;11:14; MALT [8q21];NEG for CD-10; ki-67-70%  # AUG 2018- H.pylori positive/stomach body.   # IDA   # CAD [s/p CABG; Dr.fath];     Lymphoma of fundus of stomach ([place])     HISTORY OF PRESENTING ILLNESS:  Corey Daniel 71 y.o.  male history of coronary artery disease- recently diffuse large B-cell lymphoma of the stomach is here for further evaluation and recommendations.  Patient States that he noted to have abdominal discomfort approximately 3 months ago; that was treated with PPI symptoms improved. He currently does not have any abdominal discomfort. However he ended up having endoscopy - that showed above pathology. He has been referred to [place] for further evaluation and recommendations.  Patient denies any weight loss. Denies any night sweats. Denies any lumps or bumps. Denies any fevers. He is in generally good health.  ROS: A complete 10 point review of system is done which is negative except mentioned above in history of present illness  MEDICAL HISTORY:  Past Medical History:  Diagnosis Date  . Arthritis   . ASCVD (arteriosclerotic cardiovascular disease)   . Back pain    with radiation  . Coronary artery disease   . Hyperlipidemia   . Hypertension   . IDA (iron deficiency anemia)   . Lower leg pain     SURGICAL HISTORY: Past Surgical History:  Procedure Laterality Date  . AUTOGRAFT STRUCTURAL ICBG OBTAINED SEPARATE INCISION FOR SPINE SURGERY   Bilateral 07/15/2013  . BACK SURGERY    . CATARACT EXTRACTION  2010  . COLONOSCOPY  06/2017  . CORONARY ARTERY BYPASS GRAFT    .  LAMINECTOMY LUMBAR EXCISON/EVACUATION EXTRADURAL INTRASPINAL LESION Bilateral 07/15/2013     . LAMINOTOMY REEXPLORATION POSTERIOR LUMBAR W/NERVE DECOMP & DISCECTOMY Bilateral 07/15/2013     . UPPER GI ENDOSCOPY  06/2017    SOCIAL HISTORY: He used to be in the [employer]. 30-pack-year history of smoking quit 30 years ago. No alcohol. Lives with his wife in [place].  Social History   Social History  . Marital status: Married    Spouse name: N/A  . Number of children: N/A  . Years of education: N/A   Occupational History  . Not on file.   Social History Main Topics  . Smoking status: Former Smoker    Packs/day: 0.50    Years: 30.00    Types: Cigarettes  . Smokeless tobacco: Never Used  . Alcohol use No  . Drug use: No  . Sexual activity: Yes   Other Topics Concern  . Not on file   Social History Narrative  . No narrative on file    FAMILY HISTORY:Father with Hodgkin's lymphoma in the 10s. Family History  Problem Relation Age of Onset  . Heart disease Mother   . Coarctation of the aorta Brother     ALLERGIES:  has No Known Allergies.  MEDICATIONS:  Current Outpatient Prescriptions  Medication Sig Dispense Refill  . amoxicillin (AMOXIL) 500 MG capsule Take 2 capsules by mouth daily.    Marland Kitchen aspirin 81 MG chewable tablet Chew 81 mg by mouth daily.     . carvedilol (COREG) 3.125  MG tablet Take 1 tablet (3.125 mg total) by mouth 2 (two) times daily with a meal. 180 tablet 3  . Cholecalciferol (VITAMIN D3) 2000 UNITS capsule Take by mouth.    . clarithromycin (BIAXIN) 500 MG tablet Take 2 tablets by mouth daily.    . Coenzyme Q10 100 MG capsule Take by mouth.    . ezetimibe (ZETIA) 10 MG tablet Take 1 tablet (10 mg total) by mouth at bedtime. 90 tablet 3  . Fish Oil-Cholecalciferol (FISH OIL + D3) 1000-1000 MG-UNIT CAPS Take by mouth.    Marland Kitchen lisinopril (ZESTRIL) 2.5 MG tablet Take 1 tablet (2.5 mg total) by mouth daily. 90 tablet 3  . omeprazole (PRILOSEC) 40 MG capsule Take 2  capsules by mouth daily.    . rosuvastatin (CRESTOR) 5 MG tablet Take 1 tablet (5 mg total) by mouth 3 (three) times a week. 3 times weekly 36 tablet 3   No current facility-administered medications for this visit.       Marland Kitchen  PHYSICAL EXAMINATION: ECOG PERFORMANCE STATUS: 0 - Asymptomatic  Vitals:   07/21/17 1435  BP: 131/75  Pulse: 98  Resp: 20  Temp: 97.8 F (36.6 C)   Filed Weights   07/21/17 1435  Weight: 216 lb 0.8 oz (98 kg)    GENERAL: Well-nourished well-developed; Alert, no distress and comfortable.   Accompanied by his wife. EYES: no pallor or icterus OROPHARYNX: no thrush or ulceration; good dentition  NECK: supple, no masses felt LYMPH:  no palpable lymphadenopathy in the cervical, axillary or inguinal regions LUNGS: clear to auscultation and  No wheeze or crackles HEART/CVS: regular rate & rhythm and no murmurs; No lower extremity edema ABDOMEN: abdomen soft, non-tender and normal bowel sounds Musculoskeletal:no cyanosis of digits and no clubbing  PSYCH: alert & oriented x 3 with fluent speech NEURO: no focal motor/sensory deficits SKIN:  no rashes or significant lesions  LABORATORY DATA:  I have reviewed the data as listed Lab Results  Component Value Date   WBC 6.0 07/21/2017   HGB 14.8 07/21/2017   HCT 43.6 07/21/2017   MCV 87.6 07/21/2017   PLT 170 07/21/2017    Recent Labs  12/11/16 0001 07/17/17 0818 07/21/17 1544  NA 141 141 137  K 4.5 4.3 4.6  CL 105 107 103  CO2 _0 GLUCOSE 120* 123* 106*  BUN _1 CREATININE 1.06 1.20* 1.07  CALCIUM 9.7 9.1 9.2  GFRNONAA 70 60 >60  GFRAA 81 70 >60  PROT 7.3 6.8 7.9  ALBUMIN 4.6 4.4 4.4  AST 22 24 32  ALT _2 ALKPHOS 55 61 69  BILITOT 0.8 0.6 0.7    RADIOGRAPHIC STUDIES: I have personally reviewed the radiological images as listed and agreed with the findings in the report. No results found.  ASSESSMENT & PLAN:   Lymphoma of fundus of stomach ([place]) # Diffuse large  B-cell lymphoma of the stomach; discussed pathology with the patient's family in detail. Would recommend a PET scan for further evaluation. Discussed the possible need for a bone marrow biopsy.  # Discussed the treatment options include- if limited stage- chemotherapy with R-CHOP 3 followed by radiation versus- if extensive stage- recommend R CHOP 6 cycles.  Discussed the potential side effects including but not limited to-increasing fatigue, nausea vomiting, diarrhea, hair loss, sores in the mouth, increase risk of infection and also neuropathy.   # Also discussed regarding cardiotoxicity; recommend MUGA scan; also recommend MediPort placement. Chemotherapy education.  #  H. pylori positive- on treatment through GI  # I spoke to the pathologist on the phone- confirming the above diagnosis. This was extensively discussed the patient's family in detail. We will get labs; hepatitis panel. Discussed that treatment will likely be started within the next 2 weeks or so.   # Patient will follow-up with me in 1 week/to review the results of the PET scan; next plan of care.  Thank you Dr. Benson Norway for allowing me to participate in the care of your pleasant patient. Please do not hesitate to contact me with questions or concerns in the interim.  # 60 minutes face-to-face with the patient discussing the above plan of care; more than 50% of time spent on prognosis/ natural history; counseling and coordination.   All questions were answered. The patient knows to call the clinic with any problems, questions or concerns.    Cammie Sickle, MD 07/22/2017 7:53 PM

## 2017-07-22 ENCOUNTER — Ambulatory Visit (INDEPENDENT_AMBULATORY_CARE_PROVIDER_SITE_OTHER): Payer: Medicare Other | Admitting: Family Medicine

## 2017-07-22 ENCOUNTER — Encounter: Payer: Self-pay | Admitting: Family Medicine

## 2017-07-22 VITALS — BP 131/75 | HR 79 | Temp 98.3°F | Resp 16 | Ht 70.0 in | Wt 216.0 lb

## 2017-07-22 DIAGNOSIS — E782 Mixed hyperlipidemia: Secondary | ICD-10-CM

## 2017-07-22 DIAGNOSIS — R7303 Prediabetes: Secondary | ICD-10-CM | POA: Diagnosis not present

## 2017-07-22 DIAGNOSIS — I10 Essential (primary) hypertension: Secondary | ICD-10-CM | POA: Diagnosis not present

## 2017-07-22 DIAGNOSIS — Z23 Encounter for immunization: Secondary | ICD-10-CM

## 2017-07-22 DIAGNOSIS — C8599 Non-Hodgkin lymphoma, unspecified, extranodal and solid organ sites: Secondary | ICD-10-CM | POA: Diagnosis not present

## 2017-07-22 LAB — HIV ANTIBODY (ROUTINE TESTING W REFLEX): HIV Screen 4th Generation wRfx: NONREACTIVE

## 2017-07-22 LAB — HEPATITIS B CORE ANTIBODY, IGM: Hep B C IgM: NEGATIVE

## 2017-07-22 LAB — HEPATITIS B SURFACE ANTIGEN: Hepatitis B Surface Ag: NEGATIVE

## 2017-07-22 LAB — HEPATITIS C ANTIBODY: HCV AB: 0.1 {s_co_ratio} (ref 0.0–0.9)

## 2017-07-22 NOTE — Assessment & Plan Note (Signed)
Controlled HTN - Home BP readings not available  Complicated by CAD   Plan:  1. Continue current BP regimen Coreg 3.125mg  BID, Lisinopril 2.5mg  2. Encourage improved lifestyle - start low sodium diet, regular exercise 3. Again recommend monitor BP outside office, bring readings to next visit, if persistently >140/90 or new symptoms notify office sooner 4. Follow-up 3 months

## 2017-07-22 NOTE — Assessment & Plan Note (Signed)
Controlled cholesterol on statin and zetia. Some worsening lifestyle habits. Last lipid panel 06/2017 - remains improved ASCVD risk is elevated with known CAD s/p CABG  Plan: 1. Continue current meds - Rosuvastatin 5mg  twice weekly (did not try TIW), Zetia 10mg  daily 2. Continue ASA 81mg  for secondary ASCVD risk reduction 3. Encourage improved lifestyle - low carb/cholesterol, reduce portion size, start regular exercise 4. Follow-up q 1 yr lipids

## 2017-07-22 NOTE — Progress Notes (Signed)
Subjective:    Patient ID: Corey Daniel, male    DOB: [birth year], 71 y.o.   MRN: [id number]  Corey Daniel is a 71 y.o. male presenting on 07/22/2017 for Hypertension   HPI   Pre-Diabetes: Pleased with lower A1c today 6.2, last 6.5 borderline for DM CBGs: Not checking CBG Meds: None (off Metformin >2 years) Currently on ACEi Lifestyle: - Diet (Tried to improve diet, lower starches, potatoes) - Exercise (Yard work occasionally, mowing grass, no regular exercise) Denies hypoglycemia, polyuria, visual changes, numbness or tingling.  CHRONIC HTN: Reports no concerns with BP Current Meds - Coreg 3.144m BID, Lisinopril 2.538m  Reports good compliance, took meds today. Tolerating well, w/o complaints. Denies CP, dyspnea, HA, edema, dizziness / lightheadedness  HYPERLIPIDEMIA: - Reports prior elevated cholesterol in past, last lipid panel 07/17/17, results improved and normal range - Currently taking Zetia 1069mnd Rosuvastatin 5mg42mice weekly, tolerating well without significant joint and muscle pain - Taking baby ASA 81mg54mly, for secondary prevention  Additional recent history: DIFFUSE LARGE B-CELL LYMPHOMA, Stomach / GERD Previously reported gastric burning, he was on vacation recently, and any trigger foods spicy, acidic OJ, or hunger would have worse burning, then upon returning home. Then in July 2018 [facility] some persistent worsening [facility], had blood tests showed "anemia", checked CareEverywhere saw Dr CarewSherilyn Cooterlabs with Hgb 13.2 Hct 38.8 MCV 90.9, then referral was made to go to GI ([facility] [place] [address] [place] Ad[place]eded with EGD and Colonscopy (with several polyps), sent pathology, took few weeks before final, did additional testing, resulted gastric lymphoma [facility], he was treated for H Pylori with antibiotics and BID PPI Omeprazole. Now recently saw Dr BrahmRogue Bussing/4/18 with imaging arrangements,  and proceeding plan for PET Scan and pending involvement, next would need Bone Marrow Biopsy and Port Placement, then follow-up chemotherapy and other management - concern about losing hair - 04/2017 had no GI red flags, [place] but it resolved attributed to food  Health Maintenance: - Due for Flu Shot today, will receive  Social History  Substance Use Topics  . Smoking status: Former Smoker    Packs/day: 0.50    Years: 30.00    Types: Cigarettes  . Smokeless tobacco: Never Used  . Alcohol use No    Review of Systems Per HPI unless specifically indicated above     Objective:    BP 131/75   Pulse 79   Temp 98.3 F (36.8 C) (Oral)   Resp 16   Ht 5' 10" (1.778 m)   Wt 216 lb (98 kg)   BMI 30.99 kg/m   Wt Readings from Last 3 Encounters:  07/22/17 216 lb (98 kg)  07/21/17 216 lb 0.8 oz (98 kg)  04/21/17 212 lb (96.2 kg)    Physical Exam  Constitutional: He is oriented to person, place, and time. He appears well-developed and well-nourished. No distress.  Well-appearing, comfortable, cooperative  HENT:  Head: Normocephalic and atraumatic.  Mouth/Throat: Oropharynx is clear and moist.  Eyes: Conjunctivae are normal. Right eye exhibits no discharge. Left eye exhibits no discharge.  Neck: Normal range of motion. Neck supple.  Cardiovascular: Normal rate, regular rhythm, normal heart sounds and intact distal pulses.   No murmur heard. Pulmonary/Chest: Effort normal and breath sounds normal. No respiratory distress. He has no wheezes. He has no rales.  Abdominal: Soft. Bowel sounds are normal. He exhibits  no distension and no mass. There is no tenderness. There is no rebound.  Musculoskeletal: He exhibits no edema.  Neurological: He is alert and oriented to person, place, and time.  Skin: Skin is warm and dry. No rash noted. He is not diaphoretic. No erythema.  Psychiatric: He has a normal mood and affect. His behavior is normal.  Well  groomed, good eye contact, normal speech and thoughts  Nursing note and vitals reviewed.   EGD 06/23/17, esophagus normal, stomach ulcer, stomach inflammation, duodenum normal Colonoscopy 06/23/17, IDA, multiple polyps, return 3-5 years   Recent Labs  12/11/16 0843 04/21/17 0841 07/17/17 0818  HGBA1C 6.3% 6.5* 6.2*    Results for orders placed or performed in visit on 07/22/17  HM COLONOSCOPY  Result Value Ref Range   HM Colonoscopy See Report (in chart) See Report (in chart), Patient Reported      Assessment & Plan:   Problem List Items Addressed This Visit    Pre-diabetes - Primary    Improved control Pre-DM with A1c 6.5 to 6.2, defer new dx DM for now since only 1 value at 6.5, repeat reassuring Concern with HTN, HLD, CAD  Plan:  1. Not on any therapy currently (remain off Metformin now >2 yr) 2. Encourage improved lifestyle - re-emphasized importance of diet - low carb, low sugar diet, reduce portion size, start regular exercise 3. Follow-up 3 months PreDM A1c - if 6.5 or higher will change new dx to Diabetes, may need to start medication in future with metformin if worsening again      Lymphoma of fundus of stomach ([place])    New diagnosis diffuse large b-cell lymphoma, with H pylori positive PUD Followed by Dr Rogue Bussing [facility] Will follow results of upcoming PET scan and further treatment per Heme/Onc      HLD (hyperlipidemia)    Controlled cholesterol on statin and zetia. Some worsening lifestyle habits. Last lipid panel 06/2017 - remains improved ASCVD risk is elevated with known CAD s/p CABG  Plan: 1. Continue current meds - Rosuvastatin 56m twice weekly (did not try TIW), Zetia 120mdaily 2. Continue ASA 8168mor secondary ASCVD risk reduction 3. Encourage improved lifestyle - low carb/cholesterol, reduce portion size, start regular exercise 4. Follow-up q 1 yr lipids      Essential hypertension    Controlled HTN - Home BP readings not available    Complicated by CAD   Plan:  1. Continue current BP regimen Coreg 3.125m47mD, Lisinopril 2.5mg 57mEncourage improved lifestyle - start low sodium diet, regular exercise 3. Again recommend monitor BP outside office, bring readings to next visit, if persistently >140/90 or new symptoms notify office sooner 4. Follow-up 3 months       Other Visit Diagnoses    Needs flu shot       Relevant Orders   Flu vaccine HIGH DOSE PF (Fluzone High dose) (Completed)      No orders of the defined types were placed in this encounter.   Follow up plan: Return in about 3 months (around 10/21/2017) for Pre-Diabetes A1c.  [facility] 07/22/2017, 11:02 PM

## 2017-07-22 NOTE — Assessment & Plan Note (Signed)
Improved control Pre-DM with A1c 6.5 to 6.2, defer new dx DM for now since only 1 value at 6.5, repeat reassuring Concern with HTN, HLD, CAD  Plan:  1. Not on any therapy currently (remain off Metformin now >2 yr) 2. Encourage improved lifestyle - re-emphasized importance of diet - low carb, low sugar diet, reduce portion size, start regular exercise 3. Follow-up 3 months PreDM A1c - if 6.5 or higher will change new dx to Diabetes, may need to start medication in future with metformin if worsening again

## 2017-07-22 NOTE — Assessment & Plan Note (Signed)
New diagnosis diffuse large b-cell lymphoma, with H pylori positive PUD Followed by Dr Rogue Bussing [facility] Will follow results of upcoming PET scan and further treatment per Heme/Onc

## 2017-07-22 NOTE — Patient Instructions (Signed)
Thank you for coming to the clinic today.  1. We will follow-up with your Oncology treatment as planned  2. A1c is improved 6.2, keep up the good work to avoid progression to diabetes, and avoiding Metformin  3. Flu Shot today  Please schedule a Follow-up Appointment to: Return in about 3 months (around 10/21/2017) for Pre-Diabetes A1c.  If you have any other questions or concerns, please feel free to call the clinic or send a message through [place]. You may also schedule an earlier appointment if necessary.  Additionally, you may be receiving a survey about your experience at our clinic within a few days to 1 week by e-mail or mail. We value your feedback.  [name], DO [place]

## 2017-07-28 NOTE — Patient Instructions (Signed)
Rituximab injection What is this medicine? RITUXIMAB (ri TUX i mab) is a monoclonal antibody. It is used to treat certain types of cancer like non-Hodgkin lymphoma and chronic lymphocytic leukemia. It is also used to treat rheumatoid arthritis, granulomatosis with polyangiitis (or Wegener's granulomatosis), and microscopic polyangiitis. This medicine may be used for other purposes; ask your health care provider or pharmacist if you have questions. COMMON BRAND NAME(S): Rituxan What should I tell my health care provider before I take this medicine? They need to know if you have any of these conditions: -heart disease -infection (especially a virus infection such as hepatitis B, chickenpox, cold sores, or herpes) -immune system problems -irregular heartbeat -kidney disease -lung or breathing disease, like asthma -recently received or scheduled to receive a vaccine -an unusual or allergic reaction to rituximab, mouse proteins, other medicines, foods, dyes, or preservatives -pregnant or trying to get pregnant -breast-feeding How should I use this medicine? This medicine is for infusion into a vein. It is administered in a hospital or clinic by a specially trained health care professional. A special MedGuide will be given to you by the pharmacist with each prescription and refill. Be sure to read this information carefully each time. Talk to your pediatrician regarding the use of this medicine in children. This medicine is not approved for use in children. Overdosage: If you think you have taken too much of this medicine contact a poison control center or emergency room at once. NOTE: This medicine is only for you. Do not share this medicine with others. What if I miss a dose? It is important not to miss a dose. Call your doctor or health care professional if you are unable to keep an appointment. What may interact with this medicine? -cisplatin -other medicines for arthritis like disease  modifying antirheumatic drugs or tumor necrosis factor inhibitors -live virus vaccines This list may not describe all possible interactions. Give your health care provider a list of all the medicines, herbs, non-prescription drugs, or dietary supplements you use. Also tell them if you smoke, drink alcohol, or use illegal drugs. Some items may interact with your medicine. What should I watch for while using this medicine? Your condition will be monitored carefully while you are receiving this medicine. You may need blood work done while you are taking this medicine. This medicine can cause serious allergic reactions. To reduce your risk you may need to take medicine before treatment with this medicine. Take your medicine as directed. In some patients, this medicine may cause a serious brain infection that may cause death. If you have any problems seeing, thinking, speaking, walking, or standing, tell your doctor right away. If you cannot reach your doctor, urgently seek other source of medical care. Call your doctor or health care professional for advice if you get a fever, chills or sore throat, or other symptoms of a cold or flu. Do not treat yourself. This drug decreases your body's ability to fight infections. Try to avoid being around people who are sick. Do not become pregnant while taking this medicine or for 12 months after stopping it. Women should inform their doctor if they wish to become pregnant or think they might be pregnant. There is a potential for serious side effects to an unborn child. Talk to your health care professional or pharmacist for more information. What side effects may I notice from receiving this medicine? Side effects that you should report to your doctor or health care professional as soon as possible: -breathing   problems -chest pain -dizziness or feeling faint -fast, irregular heartbeat -low blood counts - this medicine may decrease the number of white blood cells,  red blood cells and platelets. You may be at increased risk for infections and bleeding. -mouth sores -redness, blistering, peeling or loosening of the skin, including inside the mouth (this can be added for any serious or exfoliative rash that could lead to hospitalization) -signs of infection - fever or chills, cough, sore throat, pain or difficulty passing urine -signs and symptoms of kidney injury like trouble passing urine or change in the amount of urine -signs and symptoms of liver injury like dark yellow or brown urine; general ill feeling or flu-like symptoms; light-colored stools; loss of appetite; nausea; right upper belly pain; unusually weak or tired; yellowing of the eyes or skin -stomach pain -vomiting Side effects that usually do not require medical attention (report to your doctor or health care professional if they continue or are bothersome): -headache -joint pain -muscle cramps or muscle pain This list may not describe all possible side effects. Call your doctor for medical advice about side effects. You may report side effects to FDA at 1-800-FDA-1088. Where should I keep my medicine? This drug is given in a hospital or clinic and will not be stored at home. NOTE: This sheet is a summary. It may not cover all possible information. If you have questions about this medicine, talk to your doctor, pharmacist, or health care provider.  2018 Else[employer]/Gold Standard (2016-06-11 15:28:09) Doxorubicin injection What is this medicine? DOXORUBICIN (dox oh ROO bi sin) is a chemotherapy drug. It is used to treat many kinds of cancer like leukemia, lymphoma, neuroblastoma, sarcoma, and Wilms' tumor. It is also used to treat bladder cancer, breast cancer, lung cancer, ovarian cancer, stomach cancer, and thyroid cancer. This medicine may be used for other purposes; ask your health care provider or pharmacist if you have questions. COMMON BRAND NAME(S): Adriamycin, Adriamycin PFS, Adriamycin  RDF, Rubex What should I tell my health care provider before I take this medicine? They need to know if you have any of these conditions: -heart disease -history of low blood counts caused by a medicine -liver disease -recent or ongoing radiation therapy -an unusual or allergic reaction to doxorubicin, other chemotherapy agents, other medicines, foods, dyes, or preservatives -pregnant or trying to get pregnant -breast-feeding How should I use this medicine? This drug is given as an infusion into a vein. It is administered in a hospital or clinic by a specially trained health care professional. If you have pain, swelling, burning or any unusual feeling around the site of your injection, tell your health care professional right away. Talk to your pediatrician regarding the use of this medicine in children. Special care may be needed. Overdosage: If you think you have taken too much of this medicine contact a poison control center or emergency room at once. NOTE: This medicine is only for you. Do not share this medicine with others. What if I miss a dose? It is important not to miss your dose. Call your doctor or health care professional if you are unable to keep an appointment. What may interact with this medicine? This medicine may interact with the following medications: -6-mercaptopurine -paclitaxel -phenytoin -St. John's Wort -trastuzumab -verapamil This list may not describe all possible interactions. Give your health care provider a list of all the medicines, herbs, non-prescription drugs, or dietary supplements you use. Also tell them if you smoke, drink alcohol, or use illegal drugs.   Some items may interact with your medicine. What should I watch for while using this medicine? This drug may make you feel generally unwell. This is not uncommon, as chemotherapy can affect healthy cells as well as cancer cells. Report any side effects. Continue your course of treatment even though you  feel ill unless your doctor tells you to stop. There is a maximum amount of this medicine you should receive throughout your life. The amount depends on the medical condition being treated and your overall health. Your doctor will watch how much of this medicine you receive in your lifetime. Tell your doctor if you have taken this medicine before. You may need blood work done while you are taking this medicine. Your urine may turn red for a few days after your dose. This is not blood. If your urine is dark or brown, call your doctor. In some cases, you may be given additional medicines to help with side effects. Follow all directions for their use. Call your doctor or health care professional for advice if you get a fever, chills or sore throat, or other symptoms of a cold or flu. Do not treat yourself. This drug decreases your body's ability to fight infections. Try to avoid being around people who are sick. This medicine may increase your risk to bruise or bleed. Call your doctor or health care professional if you notice any unusual bleeding. Talk to your doctor about your risk of cancer. You may be more at risk for certain types of cancers if you take this medicine. Do not become pregnant while taking this medicine or for 6 months after stopping it. Women should inform their doctor if they wish to become pregnant or think they might be pregnant. Men should not father a child while taking this medicine and for 6 months after stopping it. There is a potential for serious side effects to an unborn child. Talk to your health care professional or pharmacist for more information. Do not breast-feed an infant while taking this medicine. This medicine has caused ovarian failure in some women and reduced sperm counts in some men This medicine may interfere with the ability to have a child. Talk with your doctor or health care professional if you are concerned about your fertility. What side effects may I notice  from receiving this medicine? Side effects that you should report to your doctor or health care professional as soon as possible: -allergic reactions like skin rash, itching or hives, swelling of the face, lips, or tongue -breathing problems -chest pain -fast or irregular heartbeat -low blood counts - this medicine may decrease the number of white blood cells, red blood cells and platelets. You may be at increased risk for infections and bleeding. -pain, redness, or irritation at site where injected -signs of infection - fever or chills, cough, sore throat, pain or difficulty passing urine -signs of decreased platelets or bleeding - bruising, pinpoint red spots on the skin, black, tarry stools, blood in the urine -swelling of the ankles, feet, hands -tiredness -weakness Side effects that usually do not require medical attention (report to your doctor or health care professional if they continue or are bothersome): -diarrhea -hair loss -mouth sores -nail discoloration or damage -nausea -red colored urine -vomiting This list may not describe all possible side effects. Call your doctor for medical advice about side effects. You may report side effects to FDA at 1-800-FDA-1088. Where should I keep my medicine? This drug is given in a hospital or clinic   and will not be stored at home. NOTE: This sheet is a summary. It may not cover all possible information. If you have questions about this medicine, talk to your doctor, pharmacist, or health care provider.  2018 [employer]/Gold Standard (2015-12-31 11:28:51) Vincristine injection What is this medicine? VINCRISTINE (vin KRIS teen) is a chemotherapy drug. It slows the growth of cancer cells. This medicine is used to treat many types of cancer like Hodgkin's disease, leukemia, non-Hodgkin's lymphoma, neuroblastoma (brain cancer), rhabdomyosarcoma, and Wilms' tumor. This medicine may be used for other purposes; ask your health care provider or  pharmacist if you have questions. COMMON BRAND NAME(S): Oncovin, Vincasar PFS What should I tell my health care provider before I take this medicine? They need to know if you have any of these conditions: -blood disorders -gout -infection (especially chickenpox, cold sores, or herpes) -kidney disease -liver disease -lung disease -nervous system disease like Charcot-Marie-Tooth (CMT) -recent or ongoing radiation therapy -an unusual or allergic reaction to vincristine, other chemotherapy agents, other medicines, foods, dyes, or preservatives -pregnant or trying to get pregnant -breast-feeding How should I use this medicine? This drug is given as an infusion into a vein. It is administered in a hospital or clinic by a specially trained health care professional. If you have pain, swelling, burning, or any unusual feeling around the site of your injection, tell your health care professional right away. Talk to your pediatrician regarding the use of this medicine in children. While this drug may be prescribed for selected conditions, precautions do apply. Overdosage: If you think you have taken too much of this medicine contact a poison control center or emergency room at once. NOTE: This medicine is only for you. Do not share this medicine with others. What if I miss a dose? It is important not to miss your dose. Call your doctor or health care professional if you are unable to keep an appointment. What may interact with this medicine? Do not take this medicine with any of the following medications: -itraconazole -mibefradil -voriconazole This medicine may also interact with the following medications: -cyclosporine -erythromycin -fluconazole -ketoconazole -medicines for HIV like delavirdine, efavirenz, nevirapine -medicines for seizures like ethotoin, fosphenotoin, phenytoin -medicines to increase blood counts like filgrastim, pegfilgrastim, sargramostim -other chemotherapy drugs like  cisplatin, L-asparaginase, methotrexate, mitomycin, paclitaxel -pegaspargase -vaccines -zalcitabine, ddC Talk to your doctor or health care professional before taking any of these medicines: -acetaminophen -aspirin -ibuprofen -ketoprofen -naproxen This list may not describe all possible interactions. Give your health care provider a list of all the medicines, herbs, non-prescription drugs, or dietary supplements you use. Also tell them if you smoke, drink alcohol, or use illegal drugs. Some items may interact with your medicine. What should I watch for while using this medicine? Your condition will be monitored carefully while you are receiving this medicine. You will need important blood work done while you are taking this medicine. This drug may make you feel generally unwell. This is not uncommon, as chemotherapy can affect healthy cells as well as cancer cells. Report any side effects. Continue your course of treatment even though you feel ill unless your doctor tells you to stop. In some cases, you may be given additional medicines to help with side effects. Follow all directions for their use. Call your doctor or health care professional for advice if you get a fever, chills or sore throat, or other symptoms of a cold or flu. Do not treat yourself. Avoid taking products that contain aspirin, acetaminophen, ibuprofen,   naproxen, or ketoprofen unless instructed by your doctor. These medicines may hide a fever. Do not become pregnant while taking this medicine. Women should inform their doctor if they wish to become pregnant or think they might be pregnant. There is a potential for serious side effects to an unborn child. Talk to your health care professional or pharmacist for more information. Do not breast-feed an infant while taking this medicine. Men may have a lower sperm count while taking this medicine. Talk to your doctor if you plan to father a child. What side effects may I notice from  receiving this medicine? Side effects that you should report to your doctor or health care professional as soon as possible: -allergic reactions like skin rash, itching or hives, swelling of the face, lips, or tongue -breathing problems -confusion or changes in emotions or moods -constipation -cough -mouth sores -muscle weakness -nausea and vomiting -pain, swelling, redness or irritation at the injection site -pain, tingling, numbness in the hands or feet -problems with balance, talking, walking -seizures -stomach pain -trouble passing urine or change in the amount of urine Side effects that usually do not require medical attention (report to your doctor or health care professional if they continue or are bothersome): -diarrhea -hair loss -jaw pain -loss of appetite This list may not describe all possible side effects. Call your doctor for medical advice about side effects. You may report side effects to FDA at 1-800-FDA-1088. Where should I keep my medicine? This drug is given in a hospital or clinic and will not be stored at home. NOTE: This sheet is a summary. It may not cover all possible information. If you have questions about this medicine, talk to your doctor, pharmacist, or health care provider.  2018 [employer]/Gold Standard (2008-07-31 17:17:13) Cyclophosphamide injection What is this medicine? CYCLOPHOSPHAMIDE (sye kloe FOSS fa mide) is a chemotherapy drug. It slows the growth of cancer cells. This medicine is used to treat many types of cancer like lymphoma, myeloma, leukemia, breast cancer, and ovarian cancer, to name a few. This medicine may be used for other purposes; ask your health care provider or pharmacist if you have questions. COMMON BRAND NAME(S): Cytoxan, Neosar What should I tell my health care provider before I take this medicine? They need to know if you have any of these conditions: -blood disorders -history of other chemotherapy -infection -kidney  disease -liver disease -recent or ongoing radiation therapy -tumors in the bone marrow -an unusual or allergic reaction to cyclophosphamide, other chemotherapy, other medicines, foods, dyes, or preservatives -pregnant or trying to get pregnant -breast-feeding How should I use this medicine? This drug is usually given as an injection into a vein or muscle or by infusion into a vein. It is administered in a hospital or clinic by a specially trained health care professional. Talk to your pediatrician regarding the use of this medicine in children. Special care may be needed. Overdosage: If you think you have taken too much of this medicine contact a poison control center or emergency room at once. NOTE: This medicine is only for you. Do not share this medicine with others. What if I miss a dose? It is important not to miss your dose. Call your doctor or health care professional if you are unable to keep an appointment. What may interact with this medicine? This medicine may interact with the following medications: -amiodarone -amphotericin B -azathioprine -certain antiviral medicines for HIV or AIDS such as protease inhibitors (e.g., indinavir, ritonavir) and zidovudine -certain blood   pressure medications such as benazepril, captopril, enalapril, fosinopril, lisinopril, moexipril, monopril, perindopril, quinapril, ramipril, trandolapril -certain cancer medications such as anthracyclines (e.g., daunorubicin, doxorubicin), busulfan, cytarabine, paclitaxel, pentostatin, tamoxifen, trastuzumab -certain diuretics such as chlorothiazide, chlorthalidone, hydrochlorothiazide, indapamide, metolazone -certain medicines that treat or prevent blood clots like warfarin -certain muscle relaxants such as succinylcholine -cyclosporine -etanercept -indomethacin -medicines to increase blood counts like filgrastim, pegfilgrastim, sargramostim -medicines used as general  anesthesia -metronidazole -natalizumab This list may not describe all possible interactions. Give your health care provider a list of all the medicines, herbs, non-prescription drugs, or dietary supplements you use. Also tell them if you smoke, drink alcohol, or use illegal drugs. Some items may interact with your medicine. What should I watch for while using this medicine? Visit your doctor for checks on your progress. This drug may make you feel generally unwell. This is not uncommon, as chemotherapy can affect healthy cells as well as cancer cells. Report any side effects. Continue your course of treatment even though you feel ill unless your doctor tells you to stop. Drink water or other fluids as directed. Urinate often, even at night. In some cases, you may be given additional medicines to help with side effects. Follow all directions for their use. Call your doctor or health care professional for advice if you get a fever, chills or sore throat, or other symptoms of a cold or flu. Do not treat yourself. This drug decreases your body's ability to fight infections. Try to avoid being around people who are sick. This medicine may increase your risk to bruise or bleed. Call your doctor or health care professional if you notice any unusual bleeding. Be careful brushing and flossing your teeth or using a toothpick because you may get an infection or bleed more easily. If you have any dental work done, tell your dentist you are receiving this medicine. You may get drowsy or dizzy. Do not drive, use machinery, or do anything that needs mental alertness until you know how this medicine affects you. Do not become pregnant while taking this medicine or for 1 year after stopping it. Women should inform their doctor if they wish to become pregnant or think they might be pregnant. Men should not father a child while taking this medicine and for 4 months after stopping it. There is a potential for serious side  effects to an unborn child. Talk to your health care professional or pharmacist for more information. Do not breast-feed an infant while taking this medicine. This medicine may interfere with the ability to have a child. This medicine has caused ovarian failure in some women. This medicine has caused reduced sperm counts in some men. You should talk with your doctor or health care professional if you are concerned about your fertility. If you are going to have surgery, tell your doctor or health care professional that you have taken this medicine. What side effects may I notice from receiving this medicine? Side effects that you should report to your doctor or health care professional as soon as possible: -allergic reactions like skin rash, itching or hives, swelling of the face, lips, or tongue -low blood counts - this medicine may decrease the number of white blood cells, red blood cells and platelets. You may be at increased risk for infections and bleeding. -signs of infection - fever or chills, cough, sore throat, pain or difficulty passing urine -signs of decreased platelets or bleeding - bruising, pinpoint red spots on the skin, black, tarry stools, blood   in the urine -signs of decreased red blood cells - unusually weak or tired, fainting spells, lightheadedness -breathing problems -dark urine -dizziness -palpitations -swelling of the ankles, feet, hands -trouble passing urine or change in the amount of urine -weight gain -yellowing of the eyes or skin Side effects that usually do not require medical attention (report to your doctor or health care professional if they continue or are bothersome): -changes in nail or skin color -hair loss -missed menstrual periods -mouth sores -nausea, vomiting This list may not describe all possible side effects. Call your doctor for medical advice about side effects. You may report side effects to FDA at 1-800-FDA-1088. Where should I keep my  medicine? This drug is given in a hospital or clinic and will not be stored at home. NOTE: This sheet is a summary. It may not cover all possible information. If you have questions about this medicine, talk to your doctor, pharmacist, or health care provider.  2018 [employer] Standard (2012-09-17 16:22:58)  

## 2017-07-29 ENCOUNTER — Other Ambulatory Visit: Payer: Self-pay | Admitting: General Surgery

## 2017-07-29 ENCOUNTER — Ambulatory Visit
Admission: RE | Admit: 2017-07-29 | Discharge: 2017-07-29 | Disposition: A | Discharge status: Home / Self Care | Payer: Medicare Other | Source: Ambulatory Visit | Attending: Internal Medicine | Admitting: Internal Medicine

## 2017-07-29 DIAGNOSIS — C161 Malignant neoplasm of fundus of stomach: Secondary | ICD-10-CM | POA: Diagnosis not present

## 2017-07-29 DIAGNOSIS — I700 Atherosclerosis of aorta: Secondary | ICD-10-CM | POA: Insufficient documentation

## 2017-07-29 DIAGNOSIS — K760 Fatty (change of) liver, not elsewhere classified: Secondary | ICD-10-CM | POA: Insufficient documentation

## 2017-07-29 DIAGNOSIS — R918 Other nonspecific abnormal finding of lung field: Secondary | ICD-10-CM | POA: Insufficient documentation

## 2017-07-29 DIAGNOSIS — M87851 Other osteonecrosis, right femur: Secondary | ICD-10-CM | POA: Diagnosis not present

## 2017-07-29 DIAGNOSIS — C8599 Non-Hodgkin lymphoma, unspecified, extranodal and solid organ sites: Secondary | ICD-10-CM | POA: Insufficient documentation

## 2017-07-29 DIAGNOSIS — M87852 Other osteonecrosis, left femur: Secondary | ICD-10-CM | POA: Diagnosis not present

## 2017-07-29 LAB — GLUCOSE, CAPILLARY: GLUCOSE-CAPILLARY: 114 mg/dL — AB (ref 65–99)

## 2017-07-29 MED ORDER — FLUDEOXYGLUCOSE F - 18 (FDG) INJECTION
13.2400 | Freq: Once | INTRAVENOUS | Status: AC | PRN
  Administered 2017-07-29: 13.24 via INTRAVENOUS

## 2017-07-30 ENCOUNTER — Other Ambulatory Visit: Payer: Self-pay | Admitting: Student

## 2017-07-30 ENCOUNTER — Inpatient Hospital Stay (HOSPITAL_BASED_OUTPATIENT_CLINIC_OR_DEPARTMENT_OTHER): Payer: Medicare Other | Admitting: Internal Medicine

## 2017-07-30 ENCOUNTER — Ambulatory Visit: Payer: Medicare Other | Admitting: Internal Medicine

## 2017-07-30 ENCOUNTER — Other Ambulatory Visit: Payer: Self-pay | Admitting: Physician Assistant

## 2017-07-30 ENCOUNTER — Inpatient Hospital Stay: Payer: Medicare Other

## 2017-07-30 VITALS — BP 136/80 | HR 64 | Temp 98.1°F | Resp 16 | Wt 215.6 lb

## 2017-07-30 DIAGNOSIS — M879 Osteonecrosis, unspecified: Secondary | ICD-10-CM | POA: Diagnosis not present

## 2017-07-30 DIAGNOSIS — D509 Iron deficiency anemia, unspecified: Secondary | ICD-10-CM

## 2017-07-30 DIAGNOSIS — Z7689 Persons encountering health services in other specified circumstances: Secondary | ICD-10-CM | POA: Diagnosis not present

## 2017-07-30 DIAGNOSIS — I2510 Atherosclerotic heart disease of native coronary artery without angina pectoris: Secondary | ICD-10-CM

## 2017-07-30 DIAGNOSIS — E785 Hyperlipidemia, unspecified: Secondary | ICD-10-CM

## 2017-07-30 DIAGNOSIS — Z79899 Other long term (current) drug therapy: Secondary | ICD-10-CM

## 2017-07-30 DIAGNOSIS — K760 Fatty (change of) liver, not elsewhere classified: Secondary | ICD-10-CM | POA: Diagnosis not present

## 2017-07-30 DIAGNOSIS — I10 Essential (primary) hypertension: Secondary | ICD-10-CM

## 2017-07-30 DIAGNOSIS — C8599 Non-Hodgkin lymphoma, unspecified, extranodal and solid organ sites: Secondary | ICD-10-CM

## 2017-07-30 DIAGNOSIS — I700 Atherosclerosis of aorta: Secondary | ICD-10-CM | POA: Diagnosis not present

## 2017-07-30 DIAGNOSIS — Z87891 Personal history of nicotine dependence: Secondary | ICD-10-CM

## 2017-07-30 DIAGNOSIS — I6523 Occlusion and stenosis of bilateral carotid arteries: Secondary | ICD-10-CM | POA: Diagnosis not present

## 2017-07-30 DIAGNOSIS — Z5112 Encounter for antineoplastic immunotherapy: Secondary | ICD-10-CM | POA: Diagnosis not present

## 2017-07-30 DIAGNOSIS — R918 Other nonspecific abnormal finding of lung field: Secondary | ICD-10-CM | POA: Diagnosis not present

## 2017-07-30 DIAGNOSIS — B9681 Helicobacter pylori [H. pylori] as the cause of diseases classified elsewhere: Secondary | ICD-10-CM | POA: Diagnosis not present

## 2017-07-30 DIAGNOSIS — Z5111 Encounter for antineoplastic chemotherapy: Secondary | ICD-10-CM | POA: Diagnosis not present

## 2017-07-30 DIAGNOSIS — Z7982 Long term (current) use of aspirin: Secondary | ICD-10-CM

## 2017-07-30 DIAGNOSIS — M129 Arthropathy, unspecified: Secondary | ICD-10-CM

## 2017-07-30 MED ORDER — PROCHLORPERAZINE MALEATE 10 MG PO TABS: 10.0000 mg | ORAL_TABLET | Freq: Four times a day (QID) | ORAL | 1 refills | Status: DC | PRN

## 2017-07-30 MED ORDER — LIDOCAINE-PRILOCAINE 2.5-2.5 % EX CREA: 1.0000 "application " | TOPICAL_CREAM | CUTANEOUS | 0 refills | Status: DC | PRN

## 2017-07-30 MED ORDER — PREDNISONE 50 MG PO TABS: ORAL_TABLET | ORAL | 4 refills | Status: DC

## 2017-07-30 MED ORDER — ONDANSETRON HCL 8 MG PO TABS: 8.0000 mg | ORAL_TABLET | Freq: Three times a day (TID) | ORAL | 1 refills | Status: DC | PRN

## 2017-07-30 NOTE — Progress Notes (Signed)
START ON PATHWAY REGIMEN - Lymphoma and CLL     A cycle is every 21 days:     Rituximab      Cyclophosphamide      Doxorubicin      Vincristine      Prednisone   **Always confirm dose/schedule in your pharmacy ordering system**    Patient Characteristics: Diffuse Large B Cell Lymphoma, First Line, Stage I and II, No Bulk Disease Type: Not Applicable Disease Type: Diffuse Large B Cell Line of therapy: First Line [place] Stage: IE Disease Characteristics: No Bulk  Intent of Therapy: Curative Intent, Discussed with Patient 

## 2017-07-30 NOTE — Progress Notes (Signed)
[facility] NOTE  Patient Care Team: Olin Hauser, DO as PCP - General (Family Medicine)  CHIEF COMPLAINTS/PURPOSE OF CONSULTATION:  Diffuse large B-cell lymphoma  #  Oncology History   # AUG 2018- DIFFUSE LARGE B CELL LYMPHOMA STOMACH-Fundus ;CD-20 pos;variable- bcl6/mum-?? GCB vs. ABC subtype;  [EGD for IDA; Dr.Hung; [place]]- NEG for FISH for;myc; bcl-6; bcl-2;11:14; MALT [8q21];NEG for CD-10; ki-67-70%; SEP PET- uptake in stomach. Hepatitis panel-NEG  # AUG 2018- H.pylori positive/stomach body.   # IDA   # CAD [s/p CABG; Dr.fath];     Lymphoma of fundus of stomach ([place])     HISTORY OF PRESENTING ILLNESS:  Corey Daniel 71 y.o.  male history of coronary artery disease- recently diffuse large B-cell lymphoma of the stomach is here To review the results of the PET scan.  He denies any worsening abdominal discomfort. He continues to be an PPI. Patient denies any weight loss. Denies any night sweats. Denies any lumps or bumps. Denies any fevers. He is in generally good health.  ROS: A complete 10 point review of system is done which is negative except mentioned above in history of present illness  MEDICAL HISTORY:  Past Medical History:  Diagnosis Date  . Arthritis   . ASCVD (arteriosclerotic cardiovascular disease)   . Back pain    with radiation  . Coronary artery disease   . Hyperlipidemia   . Hypertension   . IDA (iron deficiency anemia)   . Lower leg pain     SURGICAL HISTORY: Past Surgical History:  Procedure Laterality Date  . AUTOGRAFT STRUCTURAL ICBG OBTAINED SEPARATE INCISION FOR SPINE SURGERY   Bilateral 07/15/2013  . BACK SURGERY    . CATARACT EXTRACTION  2010  . COLONOSCOPY  06/2017  . CORONARY ARTERY BYPASS GRAFT    . LAMINECTOMY LUMBAR EXCISON/EVACUATION EXTRADURAL INTRASPINAL LESION Bilateral 07/15/2013     . LAMINOTOMY REEXPLORATION POSTERIOR LUMBAR W/NERVE DECOMP & DISCECTOMY Bilateral 07/15/2013     . UPPER GI  ENDOSCOPY  06/2017    SOCIAL HISTORY: He used to be in the [employer] 30-pack-year history of smoking quit 30 years ago. No alcohol. Lives with his wife in [place].  Social History   Social History  . Marital status: Married    Spouse name: N/A  . Number of children: N/A  . Years of education: N/A   Occupational History  . Not on file.   Social History Main Topics  . Smoking status: Former Smoker    Packs/day: 0.50    Years: 30.00    Types: Cigarettes  . Smokeless tobacco: Never Used  . Alcohol use No  . Drug use: No  . Sexual activity: Yes   Other Topics Concern  . Not on file   Social History Narrative  . No narrative on file    FAMILY HISTORY:Father with Hodgkin's lymphoma in the 21s. Family History  Problem Relation Age of Onset  . Heart disease Mother   . Coarctation of the aorta Brother     ALLERGIES:  has No Known Allergies.  MEDICATIONS:  Current Outpatient Prescriptions  Medication Sig Dispense Refill  . aspirin 81 MG chewable tablet Chew 81 mg by mouth daily.     . carvedilol (COREG) 3.125 MG tablet Take 1 tablet (3.125 mg total) by mouth 2 (two) times daily with a meal. 180 tablet 3  . Cholecalciferol (VITAMIN D3) 2000 UNITS capsule Take by mouth.    . Coenzyme Q10 100 MG capsule Take by mouth.    Marland Kitchen  ezetimibe (ZETIA) 10 MG tablet Take 1 tablet (10 mg total) by mouth at bedtime. 90 tablet 3  . Fish Oil-Cholecalciferol (FISH OIL + D3) 1000-1000 MG-UNIT CAPS Take by mouth.    Marland Kitchen lisinopril (ZESTRIL) 2.5 MG tablet Take 1 tablet (2.5 mg total) by mouth daily. 90 tablet 3  . omeprazole (PRILOSEC) 40 MG capsule Take 2 capsules by mouth daily.    . rosuvastatin (CRESTOR) 5 MG tablet Take 1 tablet (5 mg total) by mouth 3 (three) times a week. 3 times weekly 36 tablet 3  . lidocaine-prilocaine (EMLA) cream Apply 1 application topically as needed. Apply generously over the Mediport 45 minutes prior to chemotherapy. 30 g 0  . ondansetron (ZOFRAN) 8 MG tablet Take 1  tablet (8 mg total) by mouth every 8 (eight) hours as needed for nausea or vomiting (start 3 days; after chemo). 40 tablet 1  . predniSONE (DELTASONE) 50 MG tablet Take 2 tablets once day x 5 days. START on day of your chemotherapy. Take with food. 10 tablet 4  . prochlorperazine (COMPAZINE) 10 MG tablet Take 1 tablet (10 mg total) by mouth every 6 (six) hours as needed for nausea or vomiting. 40 tablet 1   No current facility-administered medications for this visit.       Marland Kitchen  PHYSICAL EXAMINATION: ECOG PERFORMANCE STATUS: 0 - Asymptomatic  Vitals:   07/30/17 0842  BP: 136/80  Pulse: 64  Resp: 16  Temp: 98.1 F (36.7 C)   Filed Weights   07/30/17 0842  Weight: 215 lb 9.6 oz (97.8 kg)    GENERAL: Well-nourished well-developed; Alert, no distress and comfortable.   Accompanied by his wife. EYES: no pallor or icterus OROPHARYNX: no thrush or ulceration; good dentition  NECK: supple, no masses felt LYMPH:  no palpable lymphadenopathy in the cervical, axillary or inguinal regions LUNGS: clear to auscultation and  No wheeze or crackles HEART/CVS: regular rate & rhythm and no murmurs; No lower extremity edema ABDOMEN: abdomen soft, non-tender and normal bowel sounds Musculoskeletal:no cyanosis of digits and no clubbing  PSYCH: alert & oriented x 3 with fluent speech NEURO: no focal motor/sensory deficits SKIN:  no rashes or significant lesions  LABORATORY DATA:  I have reviewed the data as listed Lab Results  Component Value Date   WBC 6.0 07/21/2017   HGB 14.8 07/21/2017   HCT 43.6 07/21/2017   MCV 87.6 07/21/2017   PLT 170 07/21/2017    Recent Labs  12/11/16 0001 07/17/17 0818 07/21/17 1544  NA 141 141 137  K 4.5 4.3 4.6  CL 105 107 103  CO2 26 22 28   GLUCOSE 120* 123* 106*  BUN 22 18 15   CREATININE 1.06 1.20* 1.07  CALCIUM 9.7 9.1 9.2  GFRNONAA 70 60 >60  GFRAA 81 70 >60  PROT 7.3 6.8 7.9  ALBUMIN 4.6 4.4 4.4  AST 22 24 32  ALT 21 21 29   ALKPHOS 55 61  69  BILITOT 0.8 0.6 0.7   IMPRESSION: 1. Isolated hypermetabolism in the gastric fundus, consistent with the clinical history of lymphoma. 2. Palatine tonsil hypermetabolism is likely physiologic. Recommend attention on follow-up. 3. Bilateral nonspecific pulmonary nodules also warrant followup attention. 4.  Aortic Atherosclerosis (ICD10-I70.0). 5. Bilateral femoral head avascular necrosis. 6. Hepatic steatosis.   Electronically Signed   By: Abigail Miyamoto M.D.   On: 07/29/2017 14:19 RADIOGRAPHIC STUDIES: I have personally reviewed the radiological images as listed and agreed with the findings in the report. Nm Pet Image Initial (  pi) Skull Base To Thigh  Result Date: 07/29/2017 CLINICAL DATA:  Initial treatment strategy for lymphoma of the gastric fundus. EXAM: NUCLEAR MEDICINE PET SKULL BASE TO THIGH TECHNIQUE: 13 point to mCi F-18 FDG was injected intravenously. Full-ring PET imaging was performed from the skull base to thigh after the radiotracer. CT data was obtained and used for attenuation correction and anatomic localization. FASTING BLOOD GLUCOSE:  Value: 114 mg/dl COMPARISON:  None. FINDINGS: NECK: Symmetric palatine tonsil hypermetabolism is without CT correlate and favored to be physiologic. No cervical nodal hypermetabolism. No cervical adenopathy. Bilateral carotid atherosclerosis. CHEST: No thoracic nodal or pulmonary parenchymal hypermetabolism identified. Prior median sternotomy for CABG. Mild cardiomegaly. Native coronary artery atherosclerosis. Calcified right mediastinal and hilar nodes are likely related to old granulomatous disease. 3 mm anterior right lung base nodule on image 124/series 3. Calcified right lower lobe granulomas. A 3 mm right posterior upper lobe pulmonary nodule on image 93/series 3. ABDOMEN/PELVIS: Focal hypermetabolism within the gastric fundus. This measures a S.U.V. max of 18.7, including on image 139/ series 3. No well-defined soft tissue mass in  this area. No abdominopelvic nodal hypermetabolism. Abdominal aortic atherosclerosis. Mild hepatic steatosis. SKELETON: No abnormal marrow activity. Subtle avascular necrosis of the femoral heads bilaterally. Right iliac bone harvest site for lumbar spine fusion. IMPRESSION: 1. Isolated hypermetabolism in the gastric fundus, consistent with the clinical history of lymphoma. 2. Palatine tonsil hypermetabolism is likely physiologic. Recommend attention on follow-up. 3. Bilateral nonspecific pulmonary nodules also warrant followup attention. 4.  Aortic Atherosclerosis (ICD10-I70.0). 5. Bilateral femoral head avascular necrosis. 6. Hepatic steatosis. Electronically Signed   By: Abigail Miyamoto M.D.   On: 07/29/2017 14:19    ASSESSMENT & PLAN:   Lymphoma of fundus of stomach ([place]) # Diffuse large B-cell lymphoma of the stomach; PET scan shows no evidence of any distant disease; isolated uptake noted in the stomach. Recommend bone marrow biopsy for further staging purposes.  If patient has NO of the involvement of the bone marrow- treatment would be chemotherapy with R-CHOP 3 followed by radiation. Discussed that the goal of treatment is cure.  # Discussed the potential side effects including but not limited to-increasing fatigue, nausea vomiting, diarrhea, hair loss, sores in the mouth, increase risk of infection and also neuropathy.   Awaiting bone marrow biopsy; MediPort placement tomorrow. Recommend MUGA scan. Chemotherapy education.  # H. pylori positive- on treatment through GI; patient will need repeat EGD in future.  # follow up Tuesday/Sep 25th [patient preference] labs- R-CHOP with onpro.   # I reviewed the blood work- with the patient in detail; also reviewed the imaging independently [as summarized above]; and with the patient in detail.    All questions were answered. The patient knows to call the clinic with any problems, questions or concerns.    Cammie Sickle, MD 07/30/2017 8:00  PM

## 2017-07-30 NOTE — Assessment & Plan Note (Addendum)
#  Diffuse large B-cell lymphoma of the stomach; PET scan shows no evidence of any distant disease; isolated uptake noted in the stomach. Recommend bone marrow biopsy for further staging purposes.  If patient has NO of the involvement of the bone marrow- treatment would be chemotherapy with R-CHOP 3 followed by radiation. Discussed that the goal of treatment is cure.  # Discussed the potential side effects including but not limited to-increasing fatigue, nausea vomiting, diarrhea, hair loss, sores in the mouth, increase risk of infection and also neuropathy.   Awaiting bone marrow biopsy; MediPort placement tomorrow. Recommend MUGA scan. Chemotherapy education.  # H. pylori positive- on treatment through GI; patient will need repeat EGD in future.  # follow up Tuesday/Sep 25th [patient preference] labs- R-CHOP with onpro.   # I reviewed the blood work- with the patient in detail; also reviewed the imaging independently [as summarized above]; and with the patient in detail.

## 2017-07-31 ENCOUNTER — Other Ambulatory Visit: Payer: Self-pay | Admitting: *Deleted

## 2017-07-31 ENCOUNTER — Ambulatory Visit
Admission: RE | Admit: 2017-07-31 | Discharge: 2017-07-31 | Disposition: A | Discharge status: Home / Self Care | Payer: Medicare Other | Source: Ambulatory Visit | Attending: Internal Medicine | Admitting: Internal Medicine

## 2017-07-31 ENCOUNTER — Other Ambulatory Visit (HOSPITAL_COMMUNITY)
Admission: RE | Admit: 2017-07-31 | Discharge: 2017-07-31 | Disposition: A | Discharge status: Home / Self Care | Payer: Medicare Other | Source: Ambulatory Visit | Attending: Internal Medicine | Admitting: Internal Medicine

## 2017-07-31 DIAGNOSIS — Z87891 Personal history of nicotine dependence: Secondary | ICD-10-CM | POA: Diagnosis not present

## 2017-07-31 DIAGNOSIS — Z79899 Other long term (current) drug therapy: Secondary | ICD-10-CM | POA: Insufficient documentation

## 2017-07-31 DIAGNOSIS — E785 Hyperlipidemia, unspecified: Secondary | ICD-10-CM | POA: Diagnosis not present

## 2017-07-31 DIAGNOSIS — C8599 Non-Hodgkin lymphoma, unspecified, extranodal and solid organ sites: Secondary | ICD-10-CM | POA: Insufficient documentation

## 2017-07-31 DIAGNOSIS — I252 Old myocardial infarction: Secondary | ICD-10-CM | POA: Diagnosis not present

## 2017-07-31 DIAGNOSIS — I2510 Atherosclerotic heart disease of native coronary artery without angina pectoris: Secondary | ICD-10-CM | POA: Diagnosis not present

## 2017-07-31 DIAGNOSIS — C801 Malignant (primary) neoplasm, unspecified: Secondary | ICD-10-CM | POA: Diagnosis not present

## 2017-07-31 DIAGNOSIS — D72822 Plasmacytosis: Secondary | ICD-10-CM | POA: Diagnosis not present

## 2017-07-31 DIAGNOSIS — D509 Iron deficiency anemia, unspecified: Secondary | ICD-10-CM | POA: Insufficient documentation

## 2017-07-31 DIAGNOSIS — I10 Essential (primary) hypertension: Secondary | ICD-10-CM | POA: Diagnosis not present

## 2017-07-31 DIAGNOSIS — R7303 Prediabetes: Secondary | ICD-10-CM | POA: Diagnosis not present

## 2017-07-31 DIAGNOSIS — Z7982 Long term (current) use of aspirin: Secondary | ICD-10-CM | POA: Insufficient documentation

## 2017-07-31 DIAGNOSIS — Z951 Presence of aortocoronary bypass graft: Secondary | ICD-10-CM | POA: Diagnosis not present

## 2017-07-31 DIAGNOSIS — C8330 Diffuse large B-cell lymphoma, unspecified site: Secondary | ICD-10-CM | POA: Diagnosis not present

## 2017-07-31 DIAGNOSIS — Z452 Encounter for adjustment and management of vascular access device: Secondary | ICD-10-CM | POA: Diagnosis not present

## 2017-07-31 HISTORY — PX: IR IMAGING GUIDED PORT INSERTION: IMG5740

## 2017-07-31 HISTORY — DX: Malignant (primary) neoplasm, unspecified: C80.1

## 2017-07-31 HISTORY — DX: Acute myocardial infarction, unspecified: I21.9

## 2017-07-31 LAB — CBC WITH DIFFERENTIAL/PLATELET
Basophils Absolute: 0.1 10*3/uL (ref 0–0.1)
Basophils Relative: 2 %
Eosinophils Absolute: 0.2 10*3/uL (ref 0–0.7)
Eosinophils Relative: 4 %
HEMATOCRIT: 41.4 % (ref 40.0–52.0)
HEMOGLOBIN: 14.4 g/dL (ref 13.0–18.0)
LYMPHS ABS: 1.3 10*3/uL (ref 1.0–3.6)
Lymphocytes Relative: 22 %
MCH: 30.4 pg (ref 26.0–34.0)
MCHC: 34.9 g/dL (ref 32.0–36.0)
MCV: 87.3 fL (ref 80.0–100.0)
MONOS PCT: 11 %
Monocytes Absolute: 0.7 10*3/uL (ref 0.2–1.0)
NEUTROS ABS: 3.6 10*3/uL (ref 1.4–6.5)
NEUTROS PCT: 61 %
Platelets: 158 10*3/uL (ref 150–440)
RBC: 4.74 MIL/uL (ref 4.40–5.90)
RDW: 14 % (ref 11.5–14.5)
WBC: 5.9 10*3/uL (ref 3.8–10.6)

## 2017-07-31 LAB — PROTIME-INR
INR: 0.96
Prothrombin Time: 12.7 seconds (ref 11.4–15.2)

## 2017-07-31 MED ORDER — FENTANYL CITRATE (PF) 100 MCG/2ML IJ SOLN
INTRAMUSCULAR | Status: AC | PRN
  Administered 2017-07-31 (×2): 25 ug via INTRAVENOUS

## 2017-07-31 MED ORDER — PROCHLORPERAZINE MALEATE 10 MG PO TABS: 10.0000 mg | ORAL_TABLET | Freq: Four times a day (QID) | ORAL | 1 refills | Status: DC | PRN

## 2017-07-31 MED ORDER — SODIUM CHLORIDE 0.9 % IV SOLN
INTRAVENOUS | Status: DC
  Administered 2017-07-31: 09:00:00 via INTRAVENOUS

## 2017-07-31 MED ORDER — ACETAMINOPHEN 325 MG PO TABS
650.0000 mg | ORAL_TABLET | Freq: Once | ORAL | Status: DC
  Filled 2017-07-31: qty 2

## 2017-07-31 MED ORDER — HEPARIN SOD (PORK) LOCK FLUSH 100 UNIT/ML IV SOLN
INTRAVENOUS | Status: AC
  Filled 2017-07-31: qty 5

## 2017-07-31 MED ORDER — FENTANYL CITRATE (PF) 100 MCG/2ML IJ SOLN
INTRAMUSCULAR | Status: AC | PRN
  Administered 2017-07-31: 50 ug via INTRAVENOUS

## 2017-07-31 MED ORDER — MIDAZOLAM HCL 5 MG/5ML IJ SOLN
INTRAMUSCULAR | Status: AC
  Filled 2017-07-31: qty 10

## 2017-07-31 MED ORDER — ONDANSETRON HCL 8 MG PO TABS: 8.0000 mg | ORAL_TABLET | Freq: Three times a day (TID) | ORAL | 1 refills | Status: DC | PRN

## 2017-07-31 MED ORDER — LIDOCAINE-PRILOCAINE 2.5-2.5 % EX CREA: 1.0000 "application " | TOPICAL_CREAM | CUTANEOUS | 0 refills | Status: DC | PRN

## 2017-07-31 MED ORDER — FENTANYL CITRATE (PF) 100 MCG/2ML IJ SOLN
INTRAMUSCULAR | Status: AC
  Filled 2017-07-31: qty 2

## 2017-07-31 MED ORDER — MIDAZOLAM HCL 5 MG/5ML IJ SOLN
INTRAMUSCULAR | Status: AC | PRN
  Administered 2017-07-31 (×3): 0.5 mg via INTRAVENOUS

## 2017-07-31 MED ORDER — PREDNISONE 50 MG PO TABS: ORAL_TABLET | ORAL | 4 refills | Status: DC

## 2017-07-31 MED ORDER — MIDAZOLAM HCL 5 MG/5ML IJ SOLN
INTRAMUSCULAR | Status: AC | PRN
  Administered 2017-07-31 (×2): 1 mg via INTRAVENOUS

## 2017-07-31 MED ORDER — CEFAZOLIN SODIUM-DEXTROSE 2-4 GM/100ML-% IV SOLN
2.0000 g | INTRAVENOUS | Status: AC
  Administered 2017-07-31: 10:00:00 2 g via INTRAVENOUS
  Filled 2017-07-31: qty 100

## 2017-07-31 NOTE — Procedures (Signed)
Lymphoma  S/p CT BM asp and core bx  No comp Stable EBL 0 Path pending Full report in pacs

## 2017-07-31 NOTE — H&P (Signed)
Chief Complaint:  Diffuse large B-cell lymphoma  Referring Physician(s): Brahmanday,Govinda R    History of Present Illness: Corey Daniel is a 71 y.o. male with newly diagnosed diffuse large B-cell lymphoma. He is here today for outpatient bone marrow biopsy followed by port catheter insertion. He begins chemotherapy next week. No current complaints. No abdominal pain, flank pain, chest pain short of breath. No fevers.  Past Medical History:  Diagnosis Date  . Arthritis   . ASCVD (arteriosclerotic cardiovascular disease)   . Back pain    with radiation  . Cancer ([place])    lymphoma  . Coronary artery disease   . Hyperlipidemia   . Hypertension   . IDA (iron deficiency anemia)   . Lower leg pain   . Myocardial infarction [facility])     Past Surgical History:  Procedure Laterality Date  . AUTOGRAFT STRUCTURAL ICBG OBTAINED SEPARATE INCISION FOR SPINE SURGERY   Bilateral 07/15/2013  . BACK SURGERY    . CATARACT EXTRACTION  2010  . COLONOSCOPY  06/2017  . CORONARY ARTERY BYPASS GRAFT    . LAMINECTOMY LUMBAR EXCISON/EVACUATION EXTRADURAL INTRASPINAL LESION Bilateral 07/15/2013     . LAMINOTOMY REEXPLORATION POSTERIOR LUMBAR W/NERVE DECOMP & DISCECTOMY Bilateral 07/15/2013     . UPPER GI ENDOSCOPY  06/2017    Allergies: Patient has no known allergies.  Medications: Prior to Admission medications   Medication Sig Start Date End Date Taking? Authorizing Provider  carvedilol (COREG) 3.125 MG tablet Take 1 tablet (3.125 mg total) by mouth 2 (two) times daily with a meal. 04/21/17  Yes Karamalegos, Devonne Doughty, DO  Cholecalciferol (VITAMIN D3) 2000 UNITS capsule Take by mouth.   Yes [provider]  Coenzyme Q10 100 MG capsule Take by mouth.   Yes [provider]  ezetimibe (ZETIA) 10 MG tablet Take 1 tablet (10 mg total) by mouth at bedtime. 04/21/17  Yes Karamalegos, Devonne Doughty, DO  Fish Oil-Cholecalciferol (FISH OIL + D3) 1000-1000 MG-UNIT CAPS Take by mouth.    Yes [provider]  lisinopril (ZESTRIL) 2.5 MG tablet Take 1 tablet (2.5 mg total) by mouth daily. 04/21/17  Yes Karamalegos, Devonne Doughty, DO  rosuvastatin (CRESTOR) 5 MG tablet Take 5 mg by mouth 2 (two) times daily.   Yes [provider]  aspirin 81 MG chewable tablet Chew 81 mg by mouth daily.     [provider]  lidocaine-prilocaine (EMLA) cream Apply 1 application topically as needed. Apply generously over the Mediport 45 minutes prior to chemotherapy. 07/30/17   Cammie Sickle, MD  omeprazole (PRILOSEC) 40 MG capsule Take 2 capsules by mouth daily. 07/16/17   [provider]  ondansetron (ZOFRAN) 8 MG tablet Take 1 tablet (8 mg total) by mouth every 8 (eight) hours as needed for nausea or vomiting (start 3 days; after chemo). 07/30/17   Cammie Sickle, MD  predniSONE (DELTASONE) 50 MG tablet Take 2 tablets once day x 5 days. START on day of your chemotherapy. Take with food. 07/30/17   Cammie Sickle, MD  prochlorperazine (COMPAZINE) 10 MG tablet Take 1 tablet (10 mg total) by mouth every 6 (six) hours as needed for nausea or vomiting. 07/30/17   Cammie Sickle, MD  rosuvastatin (CRESTOR) 5 MG tablet Take 1 tablet (5 mg total) by mouth 3 (three) times a week. 3 times weekly 04/22/17   Olin Hauser, DO     Family History  Problem Relation Age of Onset  .  Heart disease Mother   . Coarctation of the aorta Brother     Social History   Social History  . Marital status: Married    Spouse name: N/A  . Number of children: N/A  . Years of education: N/A   Social History Main Topics  . Smoking status: Former Smoker    Packs/day: 0.50    Years: 30.00    Types: Cigarettes    Quit date: 49  . Smokeless tobacco: Never Used  . Alcohol use No  . Drug use: No  . Sexual activity: Yes   Other Topics Concern  . None   Social History Narrative  . None    ECOG Status: 0 - Asymptomatic  Review of Systems: A 12 point  ROS discussed and pertinent positives are indicated in the HPI above.  All other systems are negative.  Review of Systems  Vital Signs: BP 130/82   Pulse 66   Temp 97.9 F (36.6 C)   Resp 14   SpO2 93%   Physical Exam  Constitutional: He is oriented to person, place, and time. He appears well-developed and well-nourished. No distress.  Eyes: Conjunctivae are normal.  Cardiovascular: Normal rate, regular rhythm and normal heart sounds.   Pulmonary/Chest: Effort normal and breath sounds normal.  Abdominal: Soft. Bowel sounds are normal.  Musculoskeletal: Normal range of motion. He exhibits no edema.  Neurological: He is alert and oriented to person, place, and time.  Skin: He is not diaphoretic.    Imaging: Nm Pet Image Initial (pi) Skull Base To Thigh  Result Date: 07/29/2017 CLINICAL DATA:  Initial treatment strategy for lymphoma of the gastric fundus. EXAM: NUCLEAR MEDICINE PET SKULL BASE TO THIGH TECHNIQUE: 13 point to mCi F-18 FDG was injected intravenously. Full-ring PET imaging was performed from the skull base to thigh after the radiotracer. CT data was obtained and used for attenuation correction and anatomic localization. FASTING BLOOD GLUCOSE:  Value: 114 mg/dl COMPARISON:  None. FINDINGS: NECK: Symmetric palatine tonsil hypermetabolism is without CT correlate and favored to be physiologic. No cervical nodal hypermetabolism. No cervical adenopathy. Bilateral carotid atherosclerosis. CHEST: No thoracic nodal or pulmonary parenchymal hypermetabolism identified. Prior median sternotomy for CABG. Mild cardiomegaly. Native coronary artery atherosclerosis. Calcified right mediastinal and hilar nodes are likely related to old granulomatous disease. 3 mm anterior right lung base nodule on image 124/series 3. Calcified right lower lobe granulomas. A 3 mm right posterior upper lobe pulmonary nodule on image 93/series 3. ABDOMEN/PELVIS: Focal hypermetabolism within the gastric fundus. This  measures a S.U.V. max of 18.7, including on image 139/ series 3. No well-defined soft tissue mass in this area. No abdominopelvic nodal hypermetabolism. Abdominal aortic atherosclerosis. Mild hepatic steatosis. SKELETON: No abnormal marrow activity. Subtle avascular necrosis of the femoral heads bilaterally. Right iliac bone harvest site for lumbar spine fusion. IMPRESSION: 1. Isolated hypermetabolism in the gastric fundus, consistent with the clinical history of lymphoma. 2. Palatine tonsil hypermetabolism is likely physiologic. Recommend attention on follow-up. 3. Bilateral nonspecific pulmonary nodules also warrant followup attention. 4.  Aortic Atherosclerosis (ICD10-I70.0). 5. Bilateral femoral head avascular necrosis. 6. Hepatic steatosis. Electronically Signed   By: Abigail Miyamoto M.D.   On: 07/29/2017 14:19    Labs:  CBC:  Recent Labs  12/11/16 0001 07/17/17 0818 07/21/17 1544 07/31/17 0806  WBC 5.6 5.2 6.0 5.9  HGB 15.9 14.3 14.8 14.4  HCT 46.6 43.1 43.6 41.4  PLT 156 189 170 158    COAGS:  Recent Labs  07/31/17  2081  INR 0.96    BMP:  Recent Labs  12/11/16 0001 07/17/17 0818 07/21/17 1544  NA 141 141 137  K 4.5 4.3 4.6  CL 105 107 103  CO2 _0 GLUCOSE 120* 123* 106*  BUN _1 CALCIUM 9.7 9.1 9.2  CREATININE 1.06 1.20* 1.07  GFRNONAA 70 60 >60  GFRAA 81 70 >60    LIVER FUNCTION TESTS:  Recent Labs  12/11/16 0001 07/17/17 0818 07/21/17 1544  BILITOT 0.8 0.6 0.7  AST 22 24 32  ALT _2 ALKPHOS 55 61 69  PROT 7.3 6.8 7.9  ALBUMIN 4.6 4.4 4.4    TUMOR MARKERS: No results for input(s): AFPTM, CEA, CA199, CHROMGRNA in the last 8760 hours.  Assessment and Plan:  Newly diagnosed diffuse large B-cell lymphoma. Plan for bone marrow biopsy today and port catheter insertion as an outpatient.  Risks and benefits discussed with the patient including, but not limited to bleeding, infection, damage to adjacent structures or low yield  requiring additional tests. All of the patient's questions were answered, patient is agreeable to proceed. Consent signed and in chart.  Risks and benefits discussed with the patient including, but not limited to bleeding, infection, pneumothorax, or fibrin sheath development and need for additional procedures. All of the patient's questions were answered, patient is agreeable to proceed. Consent signed and in chart.    Thank you for this interesting consult.  I greatly enjoyed meeting Demorio Seeley and look forward to participating in their care.  A copy of this report was sent to the requesting provider on this date.  Electronically Signed: Greggory Keen, MD 07/31/2017, 8:46 AM   I spent a total of  30 Minutes   in face to face in clinical consultation, greater than 50% of which was counseling/coordinating care for this patient with B-cell lymphoma.

## 2017-07-31 NOTE — Procedures (Signed)
Lymphoma  S/p RT IJ POWER PORT  Tip svcra No comp Stable ebl 0 Ready for use Full report in pacs

## 2017-08-03 ENCOUNTER — Ambulatory Visit: Admission: RE | Admit: 2017-08-03 | Payer: Medicare Other | Source: Ambulatory Visit

## 2017-08-06 ENCOUNTER — Other Ambulatory Visit: Payer: Medicare Other

## 2017-08-07 ENCOUNTER — Ambulatory Visit
Admission: RE | Admit: 2017-08-07 | Discharge: 2017-08-07 | Disposition: A | Discharge status: Home / Self Care | Payer: Medicare Other | Source: Ambulatory Visit | Attending: Internal Medicine | Admitting: Internal Medicine

## 2017-08-07 DIAGNOSIS — Z0189 Encounter for other specified special examinations: Secondary | ICD-10-CM | POA: Diagnosis not present

## 2017-08-07 DIAGNOSIS — C8599 Non-Hodgkin lymphoma, unspecified, extranodal and solid organ sites: Secondary | ICD-10-CM | POA: Diagnosis not present

## 2017-08-07 DIAGNOSIS — C8590 Non-Hodgkin lymphoma, unspecified, unspecified site: Secondary | ICD-10-CM | POA: Diagnosis not present

## 2017-08-07 MED ORDER — TECHNETIUM TC 99M-LABELED RED BLOOD CELLS IV KIT
21.9300 | PACK | Freq: Once | INTRAVENOUS | Status: AC | PRN
  Administered 2017-08-07: 21.93 via INTRAVENOUS

## 2017-08-08 ENCOUNTER — Other Ambulatory Visit: Payer: Self-pay | Admitting: Internal Medicine

## 2017-08-10 ENCOUNTER — Encounter: Payer: Self-pay | Admitting: Internal Medicine

## 2017-08-10 ENCOUNTER — Other Ambulatory Visit: Payer: Self-pay | Admitting: Internal Medicine

## 2017-08-10 DIAGNOSIS — C8599 Non-Hodgkin lymphoma, unspecified, extranodal and solid organ sites: Secondary | ICD-10-CM

## 2017-08-11 ENCOUNTER — Inpatient Hospital Stay: Payer: Medicare Other

## 2017-08-11 ENCOUNTER — Inpatient Hospital Stay (HOSPITAL_BASED_OUTPATIENT_CLINIC_OR_DEPARTMENT_OTHER): Payer: Medicare Other | Admitting: Internal Medicine

## 2017-08-11 VITALS — BP 152/89 | HR 72 | Temp 98.0°F | Resp 16 | Wt 219.4 lb

## 2017-08-11 VITALS — BP 128/73 | HR 73 | Temp 96.6°F | Resp 16

## 2017-08-11 DIAGNOSIS — M879 Osteonecrosis, unspecified: Secondary | ICD-10-CM | POA: Diagnosis not present

## 2017-08-11 DIAGNOSIS — B9681 Helicobacter pylori [H. pylori] as the cause of diseases classified elsewhere: Secondary | ICD-10-CM

## 2017-08-11 DIAGNOSIS — I2510 Atherosclerotic heart disease of native coronary artery without angina pectoris: Secondary | ICD-10-CM | POA: Diagnosis not present

## 2017-08-11 DIAGNOSIS — I10 Essential (primary) hypertension: Secondary | ICD-10-CM | POA: Diagnosis not present

## 2017-08-11 DIAGNOSIS — R918 Other nonspecific abnormal finding of lung field: Secondary | ICD-10-CM

## 2017-08-11 DIAGNOSIS — I252 Old myocardial infarction: Secondary | ICD-10-CM | POA: Diagnosis not present

## 2017-08-11 DIAGNOSIS — Z7982 Long term (current) use of aspirin: Secondary | ICD-10-CM

## 2017-08-11 DIAGNOSIS — E785 Hyperlipidemia, unspecified: Secondary | ICD-10-CM | POA: Diagnosis not present

## 2017-08-11 DIAGNOSIS — M129 Arthropathy, unspecified: Secondary | ICD-10-CM | POA: Diagnosis not present

## 2017-08-11 DIAGNOSIS — Z7689 Persons encountering health services in other specified circumstances: Secondary | ICD-10-CM

## 2017-08-11 DIAGNOSIS — D509 Iron deficiency anemia, unspecified: Secondary | ICD-10-CM

## 2017-08-11 DIAGNOSIS — K760 Fatty (change of) liver, not elsewhere classified: Secondary | ICD-10-CM

## 2017-08-11 DIAGNOSIS — Z79899 Other long term (current) drug therapy: Secondary | ICD-10-CM

## 2017-08-11 DIAGNOSIS — C8599 Non-Hodgkin lymphoma, unspecified, extranodal and solid organ sites: Secondary | ICD-10-CM

## 2017-08-11 DIAGNOSIS — I700 Atherosclerosis of aorta: Secondary | ICD-10-CM

## 2017-08-11 DIAGNOSIS — Z87891 Personal history of nicotine dependence: Secondary | ICD-10-CM

## 2017-08-11 DIAGNOSIS — Z5111 Encounter for antineoplastic chemotherapy: Secondary | ICD-10-CM | POA: Diagnosis not present

## 2017-08-11 DIAGNOSIS — Z5112 Encounter for antineoplastic immunotherapy: Secondary | ICD-10-CM | POA: Diagnosis not present

## 2017-08-11 DIAGNOSIS — I6523 Occlusion and stenosis of bilateral carotid arteries: Secondary | ICD-10-CM

## 2017-08-11 LAB — COMPREHENSIVE METABOLIC PANEL
ALT: 23 U/L (ref 17–63)
AST: 25 U/L (ref 15–41)
Albumin: 4.1 g/dL (ref 3.5–5.0)
Alkaline Phosphatase: 65 U/L (ref 38–126)
Anion gap: 7 (ref 5–15)
BUN: 17 mg/dL (ref 6–20)
CHLORIDE: 104 mmol/L (ref 101–111)
CO2: 26 mmol/L (ref 22–32)
CREATININE: 0.87 mg/dL (ref 0.61–1.24)
Calcium: 8.8 mg/dL — ABNORMAL LOW (ref 8.9–10.3)
Glucose, Bld: 115 mg/dL — ABNORMAL HIGH (ref 65–99)
Potassium: 4.1 mmol/L (ref 3.5–5.1)
SODIUM: 137 mmol/L (ref 135–145)
Total Bilirubin: 0.6 mg/dL (ref 0.3–1.2)
Total Protein: 7.4 g/dL (ref 6.5–8.1)

## 2017-08-11 LAB — CBC WITH DIFFERENTIAL/PLATELET
BASOS ABS: 0.1 10*3/uL (ref 0–0.1)
Basophils Relative: 2 %
EOS ABS: 0.3 10*3/uL (ref 0–0.7)
EOS PCT: 5 %
HCT: 41.5 % (ref 40.0–52.0)
Hemoglobin: 14.1 g/dL (ref 13.0–18.0)
LYMPHS ABS: 1.5 10*3/uL (ref 1.0–3.6)
LYMPHS PCT: 25 %
MCH: 29.5 pg (ref 26.0–34.0)
MCHC: 33.9 g/dL (ref 32.0–36.0)
MCV: 86.9 fL (ref 80.0–100.0)
MONO ABS: 0.7 10*3/uL (ref 0.2–1.0)
Monocytes Relative: 11 %
Neutro Abs: 3.5 10*3/uL (ref 1.4–6.5)
Neutrophils Relative %: 57 %
PLATELETS: 150 10*3/uL (ref 150–440)
RBC: 4.77 MIL/uL (ref 4.40–5.90)
RDW: 14.4 % (ref 11.5–14.5)
WBC: 6 10*3/uL (ref 3.8–10.6)

## 2017-08-11 MED ORDER — DEXAMETHASONE SODIUM PHOSPHATE 10 MG/ML IJ SOLN
INTRAMUSCULAR | Status: AC
  Filled 2017-08-11: qty 1

## 2017-08-11 MED ORDER — DIPHENHYDRAMINE HCL 25 MG PO CAPS
50.0000 mg | ORAL_CAPSULE | Freq: Once | ORAL | Status: AC
  Administered 2017-08-11: 50 mg via ORAL

## 2017-08-11 MED ORDER — PALONOSETRON HCL INJECTION 0.25 MG/5ML
0.2500 mg | Freq: Once | INTRAVENOUS | Status: AC
  Administered 2017-08-11: 0.25 mg via INTRAVENOUS

## 2017-08-11 MED ORDER — SODIUM CHLORIDE 0.9% FLUSH
10.0000 mL | INTRAVENOUS | Status: DC | PRN
  Administered 2017-08-11: 10 mL via INTRAVENOUS
  Filled 2017-08-11: qty 10

## 2017-08-11 MED ORDER — PEGFILGRASTIM 6 MG/0.6ML ~~LOC~~ PSKT
PREFILLED_SYRINGE | SUBCUTANEOUS | Status: AC
Start: 2017-08-11 — End: 2017-08-11
  Filled 2017-08-11: qty 0.6

## 2017-08-11 MED ORDER — HEPARIN SOD (PORK) LOCK FLUSH 100 UNIT/ML IV SOLN
500.0000 [IU] | Freq: Once | INTRAVENOUS | Status: AC
  Administered 2017-08-11: 500 [IU] via INTRAVENOUS

## 2017-08-11 MED ORDER — PALONOSETRON HCL INJECTION 0.25 MG/5ML
INTRAVENOUS | Status: AC
  Filled 2017-08-11: qty 5

## 2017-08-11 MED ORDER — HEPARIN SOD (PORK) LOCK FLUSH 100 UNIT/ML IV SOLN
INTRAVENOUS | Status: AC
  Filled 2017-08-11: qty 5

## 2017-08-11 MED ORDER — DIPHENHYDRAMINE HCL 25 MG PO CAPS
ORAL_CAPSULE | ORAL | Status: AC
  Filled 2017-08-11: qty 2

## 2017-08-11 MED ORDER — SODIUM CHLORIDE 0.9 % IV SOLN: 10.0000 mg | Freq: Once | INTRAVENOUS | Status: DC

## 2017-08-11 MED ORDER — DEXAMETHASONE SODIUM PHOSPHATE 10 MG/ML IJ SOLN
10.0000 mg | Freq: Once | INTRAMUSCULAR | Status: AC
  Administered 2017-08-11: 10 mg via INTRAVENOUS

## 2017-08-11 MED ORDER — ACETAMINOPHEN 325 MG PO TABS
650.0000 mg | ORAL_TABLET | Freq: Once | ORAL | Status: AC
  Administered 2017-08-11: 650 mg via ORAL

## 2017-08-11 MED ORDER — SODIUM CHLORIDE 0.9 % IV SOLN
375.0000 mg/m2 | Freq: Once | INTRAVENOUS | Status: AC
  Administered 2017-08-11: 800 mg via INTRAVENOUS
  Filled 2017-08-11: qty 50

## 2017-08-11 MED ORDER — ACETAMINOPHEN 325 MG PO TABS
ORAL_TABLET | ORAL | Status: AC
  Filled 2017-08-11: qty 2

## 2017-08-11 MED ORDER — DOXORUBICIN HCL CHEMO IV INJECTION 2 MG/ML
50.0000 mg/m2 | Freq: Once | INTRAVENOUS | Status: AC
  Administered 2017-08-11: 110 mg via INTRAVENOUS
  Filled 2017-08-11: qty 55

## 2017-08-11 MED ORDER — SODIUM CHLORIDE 0.9 % IV SOLN
750.0000 mg/m2 | Freq: Once | INTRAVENOUS | Status: AC
  Administered 2017-08-11: 1660 mg via INTRAVENOUS
  Filled 2017-08-11: qty 83

## 2017-08-11 MED ORDER — VINCRISTINE SULFATE CHEMO INJECTION 1 MG/ML
2.0000 mg | Freq: Once | INTRAVENOUS | Status: AC
  Administered 2017-08-11: 2 mg via INTRAVENOUS
  Filled 2017-08-11: qty 2

## 2017-08-11 MED ORDER — SODIUM CHLORIDE 0.9 % IV SOLN
Freq: Once | INTRAVENOUS | Status: AC
  Administered 2017-08-11: 10:00:00 via INTRAVENOUS
  Filled 2017-08-11: qty 1000

## 2017-08-11 MED ORDER — PEGFILGRASTIM 6 MG/0.6ML ~~LOC~~ PSKT
6.0000 mg | PREFILLED_SYRINGE | Freq: Once | SUBCUTANEOUS | Status: AC
  Administered 2017-08-11: 6 mg via SUBCUTANEOUS

## 2017-08-11 NOTE — Patient Instructions (Signed)
Cyclophosphamide injection What is this medicine? CYCLOPHOSPHAMIDE (sye kloe FOSS fa mide) is a chemotherapy drug. It slows the growth of cancer cells. This medicine is used to treat many types of cancer like lymphoma, myeloma, leukemia, breast cancer, and ovarian cancer, to name a few. This medicine may be used for other purposes; ask your health care provider or pharmacist if you have questions. COMMON BRAND NAME(S): Cytoxan, Neosar What should I tell my health care provider before I take this medicine? They need to know if you have any of these conditions: -blood disorders -history of other chemotherapy -infection -kidney disease -liver disease -recent or ongoing radiation therapy -tumors in the bone marrow -an unusual or allergic reaction to cyclophosphamide, other chemotherapy, other medicines, foods, dyes, or preservatives -pregnant or trying to get pregnant -breast-feeding How should I use this medicine? This drug is usually given as an injection into a vein or muscle or by infusion into a vein. It is administered in a hospital or clinic by a specially trained health care professional. Talk to your pediatrician regarding the use of this medicine in children. Special care may be needed. Overdosage: If you think you have taken too much of this medicine contact a poison control center or emergency room at once. NOTE: This medicine is only for you. Do not share this medicine with others. What if I miss a dose? It is important not to miss your dose. Call your doctor or health care professional if you are unable to keep an appointment. What may interact with this medicine? This medicine may interact with the following medications: -amiodarone -amphotericin B -azathioprine -certain antiviral medicines for HIV or AIDS such as protease inhibitors (e.g., indinavir, ritonavir) and zidovudine -certain blood pressure medications such as benazepril, captopril, enalapril, fosinopril,  lisinopril, moexipril, monopril, perindopril, quinapril, ramipril, trandolapril -certain cancer medications such as anthracyclines (e.g., daunorubicin, doxorubicin), busulfan, cytarabine, paclitaxel, pentostatin, tamoxifen, trastuzumab -certain diuretics such as chlorothiazide, chlorthalidone, hydrochlorothiazide, indapamide, metolazone -certain medicines that treat or prevent blood clots like warfarin -certain muscle relaxants such as succinylcholine -cyclosporine -etanercept -indomethacin -medicines to increase blood counts like filgrastim, pegfilgrastim, sargramostim -medicines used as general anesthesia -metronidazole -natalizumab This list may not describe all possible interactions. Give your health care provider a list of all the medicines, herbs, non-prescription drugs, or dietary supplements you use. Also tell them if you smoke, drink alcohol, or use illegal drugs. Some items may interact with your medicine. What should I watch for while using this medicine? Visit your doctor for checks on your progress. This drug may make you feel generally unwell. This is not uncommon, as chemotherapy can affect healthy cells as well as cancer cells. Report any side effects. Continue your course of treatment even though you feel ill unless your doctor tells you to stop. Drink water or other fluids as directed. Urinate often, even at night. In some cases, you may be given additional medicines to help with side effects. Follow all directions for their use. Call your doctor or health care professional for advice if you get a fever, chills or sore throat, or other symptoms of a cold or flu. Do not treat yourself. This drug decreases your body's ability to fight infections. Try to avoid being around people who are sick. This medicine may increase your risk to bruise or bleed. Call your doctor or health care professional if you notice any unusual bleeding. Be careful brushing and flossing your teeth or using a  toothpick because you may get an infection or bleed   more easily. If you have any dental work done, tell your dentist you are receiving this medicine. You may get drowsy or dizzy. Do not drive, use machinery, or do anything that needs mental alertness until you know how this medicine affects you. Do not become pregnant while taking this medicine or for 1 year after stopping it. Women should inform their doctor if they wish to become pregnant or think they might be pregnant. Men should not father a child while taking this medicine and for 4 months after stopping it. There is a potential for serious side effects to an unborn child. Talk to your health care professional or pharmacist for more information. Do not breast-feed an infant while taking this medicine. This medicine may interfere with the ability to have a child. This medicine has caused ovarian failure in some women. This medicine has caused reduced sperm counts in some men. You should talk with your doctor or health care professional if you are concerned about your fertility. If you are going to have surgery, tell your doctor or health care professional that you have taken this medicine. What side effects may I notice from receiving this medicine? Side effects that you should report to your doctor or health care professional as soon as possible: -allergic reactions like skin rash, itching or hives, swelling of the face, lips, or tongue -low blood counts - this medicine may decrease the number of white blood cells, red blood cells and platelets. You may be at increased risk for infections and bleeding. -signs of infection - fever or chills, cough, sore throat, pain or difficulty passing urine -signs of decreased platelets or bleeding - bruising, pinpoint red spots on the skin, black, tarry stools, blood in the urine -signs of decreased red blood cells - unusually weak or tired, fainting spells, lightheadedness -breathing problems -dark  urine -dizziness -palpitations -swelling of the ankles, feet, hands -trouble passing urine or change in the amount of urine -weight gain -yellowing of the eyes or skin Side effects that usually do not require medical attention (report to your doctor or health care professional if they continue or are bothersome): -changes in nail or skin color -hair loss -missed menstrual periods -mouth sores -nausea, vomiting This list may not describe all possible side effects. Call your doctor for medical advice about side effects. You may report side effects to FDA at 1-800-FDA-1088. Where should I keep my medicine? This drug is given in a hospital or clinic and will not be stored at home. NOTE: This sheet is a summary. It may not cover all possible information. If you have questions about this medicine, talk to your doctor, pharmacist, or health care provider.  2018 [employer]/Gold Standard (2012-09-17 16:22:58) Vincristine injection What is this medicine? VINCRISTINE (vin KRIS teen) is a chemotherapy drug. It slows the growth of cancer cells. This medicine is used to treat many types of cancer like Hodgkin's disease, leukemia, non-Hodgkin's lymphoma, neuroblastoma (brain cancer), rhabdomyosarcoma, and Wilms' tumor. This medicine may be used for other purposes; ask your health care provider or pharmacist if you have questions. COMMON BRAND NAME(S): Oncovin, Vincasar PFS What should I tell my health care provider before I take this medicine? They need to know if you have any of these conditions: -blood disorders -gout -infection (especially chickenpox, cold sores, or herpes) -kidney disease -liver disease -lung disease -nervous system disease like Charcot-Marie-Tooth (CMT) -recent or ongoing radiation therapy -an unusual or allergic reaction to vincristine, other chemotherapy agents, other medicines, foods, dyes, or   preservatives -pregnant or trying to get pregnant -breast-feeding How should I  use this medicine? This drug is given as an infusion into a vein. It is administered in a hospital or clinic by a specially trained health care professional. If you have pain, swelling, burning, or any unusual feeling around the site of your injection, tell your health care professional right away. Talk to your pediatrician regarding the use of this medicine in children. While this drug may be prescribed for selected conditions, precautions do apply. Overdosage: If you think you have taken too much of this medicine contact a poison control center or emergency room at once. NOTE: This medicine is only for you. Do not share this medicine with others. What if I miss a dose? It is important not to miss your dose. Call your doctor or health care professional if you are unable to keep an appointment. What may interact with this medicine? Do not take this medicine with any of the following medications: -itraconazole -mibefradil -voriconazole This medicine may also interact with the following medications: -cyclosporine -erythromycin -fluconazole -ketoconazole -medicines for HIV like delavirdine, efavirenz, nevirapine -medicines for seizures like ethotoin, fosphenotoin, phenytoin -medicines to increase blood counts like filgrastim, pegfilgrastim, sargramostim -other chemotherapy drugs like cisplatin, L-asparaginase, methotrexate, mitomycin, paclitaxel -pegaspargase -vaccines -zalcitabine, ddC Talk to your doctor or health care professional before taking any of these medicines: -acetaminophen -aspirin -ibuprofen -ketoprofen -naproxen This list may not describe all possible interactions. Give your health care provider a list of all the medicines, herbs, non-prescription drugs, or dietary supplements you use. Also tell them if you smoke, drink alcohol, or use illegal drugs. Some items may interact with your medicine. What should I watch for while using this medicine? Your condition will be  monitored carefully while you are receiving this medicine. You will need important blood work done while you are taking this medicine. This drug may make you feel generally unwell. This is not uncommon, as chemotherapy can affect healthy cells as well as cancer cells. Report any side effects. Continue your course of treatment even though you feel ill unless your doctor tells you to stop. In some cases, you may be given additional medicines to help with side effects. Follow all directions for their use. Call your doctor or health care professional for advice if you get a fever, chills or sore throat, or other symptoms of a cold or flu. Do not treat yourself. Avoid taking products that contain aspirin, acetaminophen, ibuprofen, naproxen, or ketoprofen unless instructed by your doctor. These medicines may hide a fever. Do not become pregnant while taking this medicine. Women should inform their doctor if they wish to become pregnant or think they might be pregnant. There is a potential for serious side effects to an unborn child. Talk to your health care professional or pharmacist for more information. Do not breast-feed an infant while taking this medicine. Men may have a lower sperm count while taking this medicine. Talk to your doctor if you plan to father a child. What side effects may I notice from receiving this medicine? Side effects that you should report to your doctor or health care professional as soon as possible: -allergic reactions like skin rash, itching or hives, swelling of the face, lips, or tongue -breathing problems -confusion or changes in emotions or moods -constipation -cough -mouth sores -muscle weakness -nausea and vomiting -pain, swelling, redness or irritation at the injection site -pain, tingling, numbness in the hands or feet -problems with balance, talking, walking -seizures -stomach   pain -trouble passing urine or change in the amount of urine Side effects that  usually do not require medical attention (report to your doctor or health care professional if they continue or are bothersome): -diarrhea -hair loss -jaw pain -loss of appetite This list may not describe all possible side effects. Call your doctor for medical advice about side effects. You may report side effects to FDA at 1-800-FDA-1088. Where should I keep my medicine? This drug is given in a hospital or clinic and will not be stored at home. NOTE: This sheet is a summary. It may not cover all possible information. If you have questions about this medicine, talk to your doctor, pharmacist, or health care provider.  2018 Else[employer]/Gold Standard (2008-07-31 17:17:13) Doxorubicin injection What is this medicine? DOXORUBICIN (dox oh ROO bi sin) is a chemotherapy drug. It is used to treat many kinds of cancer like leukemia, lymphoma, neuroblastoma, sarcoma, and Wilms' tumor. It is also used to treat bladder cancer, breast cancer, lung cancer, ovarian cancer, stomach cancer, and thyroid cancer. This medicine may be used for other purposes; ask your health care provider or pharmacist if you have questions. COMMON BRAND NAME(S): Adriamycin, Adriamycin PFS, Adriamycin RDF, Rubex What should I tell my health care provider before I take this medicine? They need to know if you have any of these conditions: -heart disease -history of low blood counts caused by a medicine -liver disease -recent or ongoing radiation therapy -an unusual or allergic reaction to doxorubicin, other chemotherapy agents, other medicines, foods, dyes, or preservatives -pregnant or trying to get pregnant -breast-feeding How should I use this medicine? This drug is given as an infusion into a vein. It is administered in a hospital or clinic by a specially trained health care professional. If you have pain, swelling, burning or any unusual feeling around the site of your injection, tell your health care professional right  away. Talk to your pediatrician regarding the use of this medicine in children. Special care may be needed. Overdosage: If you think you have taken too much of this medicine contact a poison control center or emergency room at once. NOTE: This medicine is only for you. Do not share this medicine with others. What if I miss a dose? It is important not to miss your dose. Call your doctor or health care professional if you are unable to keep an appointment. What may interact with this medicine? This medicine may interact with the following medications: -6-mercaptopurine -paclitaxel -phenytoin -St. John's Wort -trastuzumab -verapamil This list may not describe all possible interactions. Give your health care provider a list of all the medicines, herbs, non-prescription drugs, or dietary supplements you use. Also tell them if you smoke, drink alcohol, or use illegal drugs. Some items may interact with your medicine. What should I watch for while using this medicine? This drug may make you feel generally unwell. This is not uncommon, as chemotherapy can affect healthy cells as well as cancer cells. Report any side effects. Continue your course of treatment even though you feel ill unless your doctor tells you to stop. There is a maximum amount of this medicine you should receive throughout your life. The amount depends on the medical condition being treated and your overall health. Your doctor will watch how much of this medicine you receive in your lifetime. Tell your doctor if you have taken this medicine before. You may need blood work done while you are taking this medicine. Your urine may turn red for a   few days after your dose. This is not blood. If your urine is dark or brown, call your doctor. In some cases, you may be given additional medicines to help with side effects. Follow all directions for their use. Call your doctor or health care professional for advice if you get a fever, chills or  sore throat, or other symptoms of a cold or flu. Do not treat yourself. This drug decreases your body's ability to fight infections. Try to avoid being around people who are sick. This medicine may increase your risk to bruise or bleed. Call your doctor or health care professional if you notice any unusual bleeding. Talk to your doctor about your risk of cancer. You may be more at risk for certain types of cancers if you take this medicine. Do not become pregnant while taking this medicine or for 6 months after stopping it. Women should inform their doctor if they wish to become pregnant or think they might be pregnant. Men should not father a child while taking this medicine and for 6 months after stopping it. There is a potential for serious side effects to an unborn child. Talk to your health care professional or pharmacist for more information. Do not breast-feed an infant while taking this medicine. This medicine has caused ovarian failure in some women and reduced sperm counts in some men This medicine may interfere with the ability to have a child. Talk with your doctor or health care professional if you are concerned about your fertility. What side effects may I notice from receiving this medicine? Side effects that you should report to your doctor or health care professional as soon as possible: -allergic reactions like skin rash, itching or hives, swelling of the face, lips, or tongue -breathing problems -chest pain -fast or irregular heartbeat -low blood counts - this medicine may decrease the number of white blood cells, red blood cells and platelets. You may be at increased risk for infections and bleeding. -pain, redness, or irritation at site where injected -signs of infection - fever or chills, cough, sore throat, pain or difficulty passing urine -signs of decreased platelets or bleeding - bruising, pinpoint red spots on the skin, black, tarry stools, blood in the urine -swelling of  the ankles, feet, hands -tiredness -weakness Side effects that usually do not require medical attention (report to your doctor or health care professional if they continue or are bothersome): -diarrhea -hair loss -mouth sores -nail discoloration or damage -nausea -red colored urine -vomiting This list may not describe all possible side effects. Call your doctor for medical advice about side effects. You may report side effects to FDA at 1-800-FDA-1088. Where should I keep my medicine? This drug is given in a hospital or clinic and will not be stored at home. NOTE: This sheet is a summary. It may not cover all possible information. If you have questions about this medicine, talk to your doctor, pharmacist, or health care provider.  2018 [employer]/Gold Standard (2015-12-31 11:28:51) Rituximab injection What is this medicine? RITUXIMAB (ri TUX i mab) is a monoclonal antibody. It is used to treat certain types of cancer like non-Hodgkin lymphoma and chronic lymphocytic leukemia. It is also used to treat rheumatoid arthritis, granulomatosis with polyangiitis (or Wegener's granulomatosis), and microscopic polyangiitis. This medicine may be used for other purposes; ask your health care provider or pharmacist if you have questions. COMMON BRAND NAME(S): Rituxan What should I tell my health care provider before I take this medicine? They need to know   if you have any of these conditions: -heart disease -infection (especially a virus infection such as hepatitis B, chickenpox, cold sores, or herpes) -immune system problems -irregular heartbeat -kidney disease -lung or breathing disease, like asthma -recently received or scheduled to receive a vaccine -an unusual or allergic reaction to rituximab, mouse proteins, other medicines, foods, dyes, or preservatives -pregnant or trying to get pregnant -breast-feeding How should I use this medicine? This medicine is for infusion into a vein. It is  administered in a hospital or clinic by a specially trained health care professional. A special MedGuide will be given to you by the pharmacist with each prescription and refill. Be sure to read this information carefully each time. Talk to your pediatrician regarding the use of this medicine in children. This medicine is not approved for use in children. Overdosage: If you think you have taken too much of this medicine contact a poison control center or emergency room at once. NOTE: This medicine is only for you. Do not share this medicine with others. What if I miss a dose? It is important not to miss a dose. Call your doctor or health care professional if you are unable to keep an appointment. What may interact with this medicine? -cisplatin -other medicines for arthritis like disease modifying antirheumatic drugs or tumor necrosis factor inhibitors -live virus vaccines This list may not describe all possible interactions. Give your health care provider a list of all the medicines, herbs, non-prescription drugs, or dietary supplements you use. Also tell them if you smoke, drink alcohol, or use illegal drugs. Some items may interact with your medicine. What should I watch for while using this medicine? Your condition will be monitored carefully while you are receiving this medicine. You may need blood work done while you are taking this medicine. This medicine can cause serious allergic reactions. To reduce your risk you may need to take medicine before treatment with this medicine. Take your medicine as directed. In some patients, this medicine may cause a serious brain infection that may cause death. If you have any problems seeing, thinking, speaking, walking, or standing, tell your doctor right away. If you cannot reach your doctor, urgently seek other source of medical care. Call your doctor or health care professional for advice if you get a fever, chills or sore throat, or other symptoms of  a cold or flu. Do not treat yourself. This drug decreases your body's ability to fight infections. Try to avoid being around people who are sick. Do not become pregnant while taking this medicine or for 12 months after stopping it. Women should inform their doctor if they wish to become pregnant or think they might be pregnant. There is a potential for serious side effects to an unborn child. Talk to your health care professional or pharmacist for more information. What side effects may I notice from receiving this medicine? Side effects that you should report to your doctor or health care professional as soon as possible: -breathing problems -chest pain -dizziness or feeling faint -fast, irregular heartbeat -low blood counts - this medicine may decrease the number of white blood cells, red blood cells and platelets. You may be at increased risk for infections and bleeding. -mouth sores -redness, blistering, peeling or loosening of the skin, including inside the mouth (this can be added for any serious or exfoliative rash that could lead to hospitalization) -signs of infection - fever or chills, cough, sore throat, pain or difficulty passing urine -signs and symptoms of   kidney injury like trouble passing urine or change in the amount of urine -signs and symptoms of liver injury like dark yellow or brown urine; general ill feeling or flu-like symptoms; light-colored stools; loss of appetite; nausea; right upper belly pain; unusually weak or tired; yellowing of the eyes or skin -stomach pain -vomiting Side effects that usually do not require medical attention (report to your doctor or health care professional if they continue or are bothersome): -headache -joint pain -muscle cramps or muscle pain This list may not describe all possible side effects. Call your doctor for medical advice about side effects. You may report side effects to FDA at 1-800-FDA-1088. Where should I keep my medicine? This  drug is given in a hospital or clinic and will not be stored at home. NOTE: This sheet is a summary. It may not cover all possible information. If you have questions about this medicine, talk to your doctor, pharmacist, or health care provider.  2018 [employer]/Gold Standard (2016-06-11 15:28:09)  

## 2017-08-11 NOTE — Progress Notes (Signed)
[facility] NOTE  Patient Care Team: Olin Hauser, DO as PCP - General (Family Medicine)  CHIEF COMPLAINTS/PURPOSE OF CONSULTATION:  Diffuse large B-cell lymphoma  #  Oncology History   # AUG 2018- DIFFUSE LARGE B CELL LYMPHOMA STOMACH-STAGE IE [BMBx-NEG]Fundus ;CD-20 pos;variable- bcl6/mum-?? GCB vs. ABC subtype;  [EGD for IDA; Dr.Hung; [place]]- NEG for FISH for;myc; bcl-6; bcl-2;11:14; MALT [8q21];NEG for CD-10; ki-67-70%; SEP PET- uptake in stomach. Hepatitis panel-NEG  # SEP 25th 2018- R-CHOP  # AUG 2018- H.pylori positive/stomach body.   # IDA-resolved  # CAD [s/p CABG; Dr.fath];MUGA scan [sep 2018]- 56.5%     Lymphoma of fundus of stomach (HCC)     HISTORY OF PRESENTING ILLNESS:  Corey Daniel 71 y.o.  male history of coronary artery disease- recently diffuse large B-cell lymphoma of the stomach; Stage IE is here for follow-up/proceed with R CHOP chemotherapy; review that is the bone marrow biopsy.  In the interim he visited [place] for a music concert.   Denies any abdominal pain. Patient denies any weight loss. Denies any night sweats. Denies any lumps or bumps. Denies any fevers. He is in generally good health.  ROS: A complete 10 point review of system is done which is negative except mentioned above in history of present illness  MEDICAL HISTORY:  Past Medical History:  Diagnosis Date  . Arthritis   . ASCVD (arteriosclerotic cardiovascular disease)   . Back pain    with radiation  . Cancer ([place])    lymphoma  . Coronary artery disease   . Hyperlipidemia   . Hypertension   . IDA (iron deficiency anemia)   . Lower leg pain   . Myocardial infarction [facility])     SURGICAL HISTORY: Past Surgical History:  Procedure Laterality Date  . AUTOGRAFT STRUCTURAL ICBG OBTAINED SEPARATE INCISION FOR SPINE SURGERY   Bilateral 07/15/2013  . BACK SURGERY    . CATARACT EXTRACTION  2010  . COLONOSCOPY  06/2017  . CORONARY  ARTERY BYPASS GRAFT    . IR FLUORO GUIDE PORT INSERTION LEFT  07/31/2017  . LAMINECTOMY LUMBAR EXCISON/EVACUATION EXTRADURAL INTRASPINAL LESION Bilateral 07/15/2013     . LAMINOTOMY REEXPLORATION POSTERIOR LUMBAR W/NERVE DECOMP & DISCECTOMY Bilateral 07/15/2013     . UPPER GI ENDOSCOPY  06/2017    SOCIAL HISTORY: He used to be in the [employer] 30-pack-year history of smoking quit 30 years ago. No alcohol. Lives with his wife in [place].  Social History   Social History  . Marital status: Married    Spouse name: N/A  . Number of children: N/A  . Years of education: N/A   Occupational History  . Not on file.   Social History Main Topics  . Smoking status: Former Smoker    Packs/day: 0.50    Years: 30.00    Types: Cigarettes    Quit date: 37  . Smokeless tobacco: Never Used  . Alcohol use No  . Drug use: No  . Sexual activity: Yes   Other Topics Concern  . Not on file   Social History Narrative  . No narrative on file    FAMILY HISTORY:Father with Hodgkin's lymphoma in the 88s. Family History  Problem Relation Age of Onset  . Heart disease Mother   . Coarctation of the aorta Brother     ALLERGIES:  has No Known Allergies.  MEDICATIONS:  Current Outpatient Prescriptions  Medication Sig Dispense Refill  . aspirin 81 MG chewable tablet Chew 81 mg  by mouth daily.     . carvedilol (COREG) 3.125 MG tablet Take 1 tablet (3.125 mg total) by mouth 2 (two) times daily with a meal. 180 tablet 3  . Cholecalciferol (VITAMIN D3) 2000 UNITS capsule Take by mouth.    . Coenzyme Q10 100 MG capsule Take by mouth.    . ezetimibe (ZETIA) 10 MG tablet Take 1 tablet (10 mg total) by mouth at bedtime. 90 tablet 3  . Fish Oil-Cholecalciferol (FISH OIL + D3) 1000-1000 MG-UNIT CAPS Take by mouth.    . lidocaine-prilocaine (EMLA) cream Apply 1 application topically as needed. Apply generously over the Mediport 45 minutes prior to chemotherapy. 30 g 0  . lisinopril (ZESTRIL) 2.5 MG tablet Take  1 tablet (2.5 mg total) by mouth daily. 90 tablet 3  . omeprazole (PRILOSEC) 40 MG capsule Take 2 capsules by mouth daily.    . ondansetron (ZOFRAN) 8 MG tablet Take 1 tablet (8 mg total) by mouth every 8 (eight) hours as needed for nausea or vomiting (start 3 days; after chemo). 40 tablet 1  . predniSONE (DELTASONE) 50 MG tablet Take 2 tablets once day x 5 days. START on day of your chemotherapy. Take with food. 10 tablet 4  . prochlorperazine (COMPAZINE) 10 MG tablet Take 1 tablet (10 mg total) by mouth every 6 (six) hours as needed for nausea or vomiting. 40 tablet 1  . rosuvastatin (CRESTOR) 5 MG tablet Take 1 tablet (5 mg total) by mouth 3 (three) times a week. 3 times weekly (Patient taking differently: Take 5 mg by mouth 2 (two) times a week. Take 1 tablet 2 times weekly.) 36 tablet 3   No current facility-administered medications for this visit.    Facility-Administered Medications Ordered in Other Visits  Medication Dose Route Frequency Provider Last Rate Last Dose  . heparin lock flush 100 unit/mL  500 Units Intravenous Once Charlaine Dalton R, MD      . pegfilgrastim (NEULASTA ONPRO KIT) injection 6 mg  6 mg Subcutaneous Once Charlaine Dalton R, MD      . sodium chloride flush (NS) 0.9 % injection 10 mL  10 mL Intravenous PRN Cammie Sickle, MD   10 mL at 08/11/17 0857      .  PHYSICAL EXAMINATION: ECOG PERFORMANCE STATUS: 0 - Asymptomatic  Vitals:   08/11/17 0912  BP: (!) 152/89  Pulse: 72  Resp: 16  Temp: 98 F (36.7 C)   Filed Weights   08/11/17 0908 08/11/17 0912  Weight: 218 lb 12.9 oz (99.2 kg) 219 lb 5.7 oz (99.5 kg)    GENERAL: Well-nourished well-developed; Alert, no distress and comfortable.  He is alone. EYES: no pallor or icterus OROPHARYNX: no thrush or ulceration; good dentition  NECK: supple, no masses felt LYMPH:  no palpable lymphadenopathy in the cervical, axillary or inguinal regions LUNGS: clear to auscultation and  No wheeze or  crackles HEART/CVS: regular rate & rhythm and no murmurs; No lower extremity edema ABDOMEN: abdomen soft, non-tender and normal bowel sounds Musculoskeletal:no cyanosis of digits and no clubbing  PSYCH: alert & oriented x 3 with fluent speech NEURO: no focal motor/sensory deficits SKIN:  no rashes or significant lesions  LABORATORY DATA:  I have reviewed the data as listed Lab Results  Component Value Date   WBC 6.0 08/11/2017   HGB 14.1 08/11/2017   HCT 41.5 08/11/2017   MCV 86.9 08/11/2017   PLT 150 08/11/2017    Recent Labs  07/17/17 0818 07/21/17 1544 08/11/17  0843  NA 141 137 137  K 4.3 4.6 4.1  CL 107 103 104  CO2 _0 GLUCOSE 123* 106* 115*  BUN _1 CREATININE 1.20* 1.07 0.87  CALCIUM 9.1 9.2 8.8*  GFRNONAA 60 >60 >60  GFRAA 70 >60 >60  PROT 6.8 7.9 7.4  ALBUMIN 4.4 4.4 4.1  AST 24 32 25  ALT _2 ALKPHOS 61 69 65  BILITOT 0.6 0.7 0.6   IMPRESSION: 1. Isolated hypermetabolism in the gastric fundus, consistent with the clinical history of lymphoma. 2. Palatine tonsil hypermetabolism is likely physiologic. Recommend attention on follow-up. 3. Bilateral nonspecific pulmonary nodules also warrant followup attention. 4.  Aortic Atherosclerosis (ICD10-I70.0). 5. Bilateral femoral head avascular necrosis. 6. Hepatic steatosis.   Electronically Signed   By: Abigail Miyamoto M.D.   On: 07/29/2017 14:19 RADIOGRAPHIC STUDIES: I have personally reviewed the radiological images as listed and agreed with the findings in the report. Nm Cardiac Muga Rest  Result Date: 08/07/2017 CLINICAL DATA:  Lymphoma.  Pre chemotherapy. EXAM: NUCLEAR MEDICINE CARDIAC BLOOD POOL IMAGING (MUGA) TECHNIQUE: Cardiac multi-gated acquisition was performed at rest following intravenous injection of Tc-41mlabeled red blood cells. RADIOPHARMACEUTICALS:  21.93 MCi Tc-923mDP in-vitro labeled red blood cells IV COMPARISON:  None. FINDINGS: The cine images demonstrate normal  left ventricular wall motion. No akinetic or dyskinetic segments are identified. The ejection fraction was calculated at 56.5%. IMPRESSION: Normal left ventricular wall motion with ejection fraction of 56.5%. Electronically Signed   By: P.Marijo Sanes.D.   On: 08/07/2017 15:16   Nm Pet Image Initial (pi) Skull Base To Thigh  Result Date: 07/29/2017 CLINICAL DATA:  Initial treatment strategy for lymphoma of the gastric fundus. EXAM: NUCLEAR MEDICINE PET SKULL BASE TO THIGH TECHNIQUE: 13 point to mCi F-18 FDG was injected intravenously. Full-ring PET imaging was performed from the skull base to thigh after the radiotracer. CT data was obtained and used for attenuation correction and anatomic localization. FASTING BLOOD GLUCOSE:  Value: 114 mg/dl COMPARISON:  None. FINDINGS: NECK: Symmetric palatine tonsil hypermetabolism is without CT correlate and favored to be physiologic. No cervical nodal hypermetabolism. No cervical adenopathy. Bilateral carotid atherosclerosis. CHEST: No thoracic nodal or pulmonary parenchymal hypermetabolism identified. Prior median sternotomy for CABG. Mild cardiomegaly. Native coronary artery atherosclerosis. Calcified right mediastinal and hilar nodes are likely related to old granulomatous disease. 3 mm anterior right lung base nodule on image 124/series 3. Calcified right lower lobe granulomas. A 3 mm right posterior upper lobe pulmonary nodule on image 93/series 3. ABDOMEN/PELVIS: Focal hypermetabolism within the gastric fundus. This measures a S.U.V. max of 18.7, including on image 139/ series 3. No well-defined soft tissue mass in this area. No abdominopelvic nodal hypermetabolism. Abdominal aortic atherosclerosis. Mild hepatic steatosis. SKELETON: No abnormal marrow activity. Subtle avascular necrosis of the femoral heads bilaterally. Right iliac bone harvest site for lumbar spine fusion. IMPRESSION: 1. Isolated hypermetabolism in the gastric fundus, consistent with the clinical  history of lymphoma. 2. Palatine tonsil hypermetabolism is likely physiologic. Recommend attention on follow-up. 3. Bilateral nonspecific pulmonary nodules also warrant followup attention. 4.  Aortic Atherosclerosis (ICD10-I70.0). 5. Bilateral femoral head avascular necrosis. 6. Hepatic steatosis. Electronically Signed   By: KyAbigail Miyamoto.D.   On: 07/29/2017 14:19   Ct Biopsy  Result Date: 07/31/2017 INDICATION: Diffuse B-cell lymphoma EXAM: CT GUIDED LEFT ILIAC BONE MARROW ASPIRATION AND CORE BIOPSY Date:  9/14/20189/14/2018 9:52 am Radiologist:  M. TrDaryll BrodMD Guidance:  CT FLUOROSCOPY TIME:  Fluoroscopy Time: NONE. MEDICATIONS: None. ANESTHESIA/SEDATION: 2.0 mg IV Versed; 50 mcg IV Fentanyl Moderate Sedation Time:  15 minutes The patient was continuously monitored during the procedure by the interventional radiology nurse under my direct supervision. CONTRAST:  None. COMPLICATIONS: None PROCEDURE: Informed consent was obtained from the patient following explanation of the procedure, risks, benefits and alternatives. The patient understands, agrees and consents for the procedure. All questions were addressed. A time out was performed. The patient was positioned prone and non-contrast localization CT was performed of the pelvis to demonstrate the iliac marrow spaces. Maximal barrier sterile technique utilized including caps, mask, sterile gowns, sterile gloves, large sterile drape, hand hygiene, and Betadine prep. Under sterile conditions and local anesthesia, an 11 gauge coaxial bone biopsy needle was advanced into the left iliac marrow space. Needle position was confirmed with CT imaging. Initially, bone marrow aspiration was performed. Next, the 11 gauge outer cannula was utilized to obtain a left iliac bone marrow core biopsy. Needle was removed. Hemostasis was obtained with compression. The patient tolerated the procedure well. Samples were prepared with the cytotechnologist. No immediate  complications. IMPRESSION: CT guided left iliac bone marrow aspiration and core biopsy. Electronically Signed   By: Jerilynn Mages.  Shick M.D.   On: 07/31/2017 09:56   Ir Fluoro Guide Port Insertion Left  Result Date: 07/31/2017 CLINICAL DATA:  Diffuse B-cell lymphoma EXAM: RIGHT INTERNAL JUGULAR SINGLE LUMEN POWER PORT CATHETER INSERTION Date:  9/14/20189/14/2018 10:55 am Radiologist:  M. Daryll Brod, MD Guidance:  Ultrasound and fluoroscopic MEDICATIONS: Ancef 2 g; The antibiotic was administered within an appropriate time interval prior to skin puncture. ANESTHESIA/SEDATION: Versed 1.5 mg IV; Fentanyl 50 mcg IV; Moderate Sedation Time:  29 The patient was continuously monitored during the procedure by the interventional radiology nurse under my direct supervision. FLUOROSCOPY TIME:  One minutes, 18 seconds (14 mGy) COMPLICATIONS: None immediate. CONTRAST:  None. PROCEDURE: Informed consent was obtained from the patient following explanation of the procedure, risks, benefits and alternatives. The patient understands, agrees and consents for the procedure. All questions were addressed. A time out was performed. Maximal barrier sterile technique utilized including caps, mask, sterile gowns, sterile gloves, large sterile drape, hand hygiene, and 2% chlorhexidine scrub. Under sterile conditions and local anesthesia, right internal jugular micropuncture venous access was performed. Access was performed with ultrasound. Images were obtained for documentation. A guide wire was inserted followed by a transitional dilator. This allowed insertion of a guide wire and catheter into the IVC. Measurements were obtained from the SVC / RA junction back to the right IJ venotomy site. In the right infraclavicular chest, a subcutaneous pocket was created over the second anterior rib. This was done under sterile conditions and local anesthesia. 1% lidocaine with epinephrine was utilized for this. A 2.5 cm incision was made in the skin.  Blunt dissection was performed to create a subcutaneous pocket over the right pectoralis major muscle. The pocket was flushed with saline vigorously. There was adequate hemostasis. The port catheter was assembled and checked for leakage. The port catheter was secured in the pocket with two retention sutures. The tubing was tunneled subcutaneously to the right venotomy site and inserted into the SVC/RA junction through a valved peel-away sheath. Position was confirmed with fluoroscopy. Images were obtained for documentation. The patient tolerated the procedure well. No immediate complications. Incisions were closed in a two layer fashion with 4 - 0 Vicryl suture. Dermabond was applied to the skin. The port catheter was accessed,  blood was aspirated followed by saline and heparin flushes. Needle was removed. A dry sterile dressing was applied. IMPRESSION: Ultrasound and fluoroscopically guided right internal jugular single lumen power port catheter insertion. Tip in the SVC/RA junction. Catheter ready for use. Electronically Signed   By: Jerilynn Mages.  Shick M.D.   On: 07/31/2017 11:01    ASSESSMENT & PLAN:   Lymphoma of fundus of stomach ([place]) # Diffuse large B-cell lymphoma of the stomach;STAGE I E.  Recommend R-CHOP 3 followed by radiation. Discussed that the goal of treatment is cure.  # Discussed the potential side effects including but not limited to-increasing fatigue, nausea vomiting, diarrhea, hair loss, sores in the mouth, increase risk of infection and also neuropathy. Disscussed re: claritin- for the body aches associated with onpro.  # Reviewed the MUGA scan is normal/bone marrow biopsy in detail.   # follow up 10 days;-labs; follow up with 3 weeks/labs/chemo; onpro.   # I reviewed the blood work- with the patient in detail; also reviewed the imaging independently [as summarized above]; and with the patient in detail.   All questions were answered. The patient knows to call the clinic with any  problems, questions or concerns.    Cammie Sickle, MD 08/11/2017 1:41 PM

## 2017-08-11 NOTE — Patient Instructions (Signed)
Start taking claritin 1 a day; starting today x7 days.

## 2017-08-11 NOTE — Assessment & Plan Note (Addendum)
#  Diffuse large B-cell lymphoma of the stomach;STAGE I E.  Recommend R-CHOP 3 followed by radiation. Discussed that the goal of treatment is cure.  # Discussed the potential side effects including but not limited to-increasing fatigue, nausea vomiting, diarrhea, hair loss, sores in the mouth, increase risk of infection and also neuropathy. Disscussed re: claritin- for the body aches associated with onpro.  # Reviewed the MUGA scan is normal/bone marrow biopsy in detail.   # follow up 10 days;-labs; follow up with 3 weeks/labs/chemo; onpro.   # I reviewed the blood work- with the patient in detail; also reviewed the imaging independently [as summarized above]; and with the patient in detail.

## 2017-08-20 ENCOUNTER — Encounter (HOSPITAL_COMMUNITY): Payer: Self-pay

## 2017-08-21 ENCOUNTER — Other Ambulatory Visit: Payer: Self-pay

## 2017-08-21 ENCOUNTER — Inpatient Hospital Stay: Payer: Medicare Other | Attending: Internal Medicine

## 2017-08-21 DIAGNOSIS — Z5111 Encounter for antineoplastic chemotherapy: Secondary | ICD-10-CM | POA: Insufficient documentation

## 2017-08-21 DIAGNOSIS — I252 Old myocardial infarction: Secondary | ICD-10-CM | POA: Diagnosis not present

## 2017-08-21 DIAGNOSIS — C8339 Diffuse large B-cell lymphoma, extranodal and solid organ sites: Secondary | ICD-10-CM | POA: Diagnosis not present

## 2017-08-21 DIAGNOSIS — I2510 Atherosclerotic heart disease of native coronary artery without angina pectoris: Secondary | ICD-10-CM | POA: Diagnosis not present

## 2017-08-21 DIAGNOSIS — Z79899 Other long term (current) drug therapy: Secondary | ICD-10-CM | POA: Insufficient documentation

## 2017-08-21 DIAGNOSIS — Z87891 Personal history of nicotine dependence: Secondary | ICD-10-CM | POA: Diagnosis not present

## 2017-08-21 DIAGNOSIS — K760 Fatty (change of) liver, not elsewhere classified: Secondary | ICD-10-CM | POA: Diagnosis not present

## 2017-08-21 DIAGNOSIS — D509 Iron deficiency anemia, unspecified: Secondary | ICD-10-CM | POA: Diagnosis not present

## 2017-08-21 DIAGNOSIS — Z7982 Long term (current) use of aspirin: Secondary | ICD-10-CM | POA: Insufficient documentation

## 2017-08-21 DIAGNOSIS — M549 Dorsalgia, unspecified: Secondary | ICD-10-CM | POA: Diagnosis not present

## 2017-08-21 DIAGNOSIS — I700 Atherosclerosis of aorta: Secondary | ICD-10-CM | POA: Diagnosis not present

## 2017-08-21 DIAGNOSIS — R918 Other nonspecific abnormal finding of lung field: Secondary | ICD-10-CM | POA: Insufficient documentation

## 2017-08-21 DIAGNOSIS — I10 Essential (primary) hypertension: Secondary | ICD-10-CM | POA: Diagnosis not present

## 2017-08-21 DIAGNOSIS — M129 Arthropathy, unspecified: Secondary | ICD-10-CM | POA: Insufficient documentation

## 2017-08-21 DIAGNOSIS — Z7689 Persons encountering health services in other specified circumstances: Secondary | ICD-10-CM | POA: Insufficient documentation

## 2017-08-21 DIAGNOSIS — E785 Hyperlipidemia, unspecified: Secondary | ICD-10-CM | POA: Diagnosis not present

## 2017-08-21 DIAGNOSIS — Z5112 Encounter for antineoplastic immunotherapy: Secondary | ICD-10-CM | POA: Diagnosis not present

## 2017-08-21 DIAGNOSIS — M87850 Other osteonecrosis, pelvis: Secondary | ICD-10-CM | POA: Insufficient documentation

## 2017-08-21 DIAGNOSIS — C8599 Non-Hodgkin lymphoma, unspecified, extranodal and solid organ sites: Secondary | ICD-10-CM

## 2017-08-21 LAB — CBC WITH DIFFERENTIAL/PLATELET
BASOS ABS: 0 10*3/uL (ref 0–0.1)
BASOS PCT: 1 %
EOS ABS: 0 10*3/uL (ref 0–0.7)
EOS PCT: 1 %
HCT: 41.1 % (ref 40.0–52.0)
Hemoglobin: 14 g/dL (ref 13.0–18.0)
Lymphocytes Relative: 17 %
Lymphs Abs: 1 10*3/uL (ref 1.0–3.6)
MCH: 29.4 pg (ref 26.0–34.0)
MCHC: 34 g/dL (ref 32.0–36.0)
MCV: 86.5 fL (ref 80.0–100.0)
MONO ABS: 0.6 10*3/uL (ref 0.2–1.0)
Monocytes Relative: 9 %
NEUTROS ABS: 4.3 10*3/uL (ref 1.4–6.5)
Neutrophils Relative %: 72 %
PLATELETS: 74 10*3/uL — AB (ref 150–440)
RBC: 4.75 MIL/uL (ref 4.40–5.90)
RDW: 14 % (ref 11.5–14.5)
WBC: 6 10*3/uL (ref 3.8–10.6)

## 2017-08-21 LAB — BASIC METABOLIC PANEL
ANION GAP: 10 (ref 5–15)
BUN: 22 mg/dL — ABNORMAL HIGH (ref 6–20)
CALCIUM: 8.3 mg/dL — AB (ref 8.9–10.3)
CO2: 25 mmol/L (ref 22–32)
Chloride: 100 mmol/L — ABNORMAL LOW (ref 101–111)
Creatinine, Ser: 0.9 mg/dL (ref 0.61–1.24)
Glucose, Bld: 123 mg/dL — ABNORMAL HIGH (ref 65–99)
Potassium: 3.9 mmol/L (ref 3.5–5.1)
SODIUM: 135 mmol/L (ref 135–145)

## 2017-08-24 LAB — CHROMOSOME ANALYSIS, BONE MARROW

## 2017-09-01 ENCOUNTER — Inpatient Hospital Stay: Payer: Medicare Other

## 2017-09-01 ENCOUNTER — Inpatient Hospital Stay (HOSPITAL_BASED_OUTPATIENT_CLINIC_OR_DEPARTMENT_OTHER): Payer: Medicare Other | Admitting: Internal Medicine

## 2017-09-01 ENCOUNTER — Encounter: Payer: Self-pay | Admitting: Internal Medicine

## 2017-09-01 VITALS — BP 116/80 | HR 68 | Temp 96.2°F | Resp 18 | Ht 70.0 in | Wt 216.1 lb

## 2017-09-01 DIAGNOSIS — C8339 Diffuse large B-cell lymphoma, extranodal and solid organ sites: Secondary | ICD-10-CM

## 2017-09-01 DIAGNOSIS — Z5111 Encounter for antineoplastic chemotherapy: Secondary | ICD-10-CM | POA: Diagnosis not present

## 2017-09-01 DIAGNOSIS — Z7689 Persons encountering health services in other specified circumstances: Secondary | ICD-10-CM | POA: Diagnosis not present

## 2017-09-01 DIAGNOSIS — C8599 Non-Hodgkin lymphoma, unspecified, extranodal and solid organ sites: Secondary | ICD-10-CM

## 2017-09-01 DIAGNOSIS — E785 Hyperlipidemia, unspecified: Secondary | ICD-10-CM | POA: Diagnosis not present

## 2017-09-01 DIAGNOSIS — Z87891 Personal history of nicotine dependence: Secondary | ICD-10-CM

## 2017-09-01 DIAGNOSIS — R918 Other nonspecific abnormal finding of lung field: Secondary | ICD-10-CM | POA: Diagnosis not present

## 2017-09-01 DIAGNOSIS — I700 Atherosclerosis of aorta: Secondary | ICD-10-CM

## 2017-09-01 DIAGNOSIS — M549 Dorsalgia, unspecified: Secondary | ICD-10-CM

## 2017-09-01 DIAGNOSIS — M129 Arthropathy, unspecified: Secondary | ICD-10-CM | POA: Diagnosis not present

## 2017-09-01 DIAGNOSIS — I252 Old myocardial infarction: Secondary | ICD-10-CM | POA: Diagnosis not present

## 2017-09-01 DIAGNOSIS — Z7982 Long term (current) use of aspirin: Secondary | ICD-10-CM

## 2017-09-01 DIAGNOSIS — K760 Fatty (change of) liver, not elsewhere classified: Secondary | ICD-10-CM | POA: Diagnosis not present

## 2017-09-01 DIAGNOSIS — Z5112 Encounter for antineoplastic immunotherapy: Secondary | ICD-10-CM | POA: Diagnosis not present

## 2017-09-01 DIAGNOSIS — I10 Essential (primary) hypertension: Secondary | ICD-10-CM

## 2017-09-01 DIAGNOSIS — D509 Iron deficiency anemia, unspecified: Secondary | ICD-10-CM | POA: Diagnosis not present

## 2017-09-01 DIAGNOSIS — Z79899 Other long term (current) drug therapy: Secondary | ICD-10-CM

## 2017-09-01 DIAGNOSIS — M87850 Other osteonecrosis, pelvis: Secondary | ICD-10-CM

## 2017-09-01 LAB — CBC WITH DIFFERENTIAL/PLATELET
BASOS ABS: 0.2 10*3/uL — AB (ref 0–0.1)
BASOS PCT: 3 %
EOS ABS: 0 10*3/uL (ref 0–0.7)
Eosinophils Relative: 0 %
HEMATOCRIT: 38.5 % — AB (ref 40.0–52.0)
HEMOGLOBIN: 13.1 g/dL (ref 13.0–18.0)
Lymphocytes Relative: 15 %
Lymphs Abs: 1 10*3/uL (ref 1.0–3.6)
MCH: 29.6 pg (ref 26.0–34.0)
MCHC: 34.1 g/dL (ref 32.0–36.0)
MCV: 86.8 fL (ref 80.0–100.0)
MONO ABS: 1 10*3/uL (ref 0.2–1.0)
MONOS PCT: 16 %
NEUTROS ABS: 4.1 10*3/uL (ref 1.4–6.5)
NEUTROS PCT: 66 %
Platelets: 206 10*3/uL (ref 150–440)
RBC: 4.44 MIL/uL (ref 4.40–5.90)
RDW: 14.5 % (ref 11.5–14.5)
WBC: 6.4 10*3/uL (ref 3.8–10.6)

## 2017-09-01 LAB — COMPREHENSIVE METABOLIC PANEL
ALBUMIN: 3.7 g/dL (ref 3.5–5.0)
ALT: 23 U/L (ref 17–63)
ANION GAP: 7 (ref 5–15)
AST: 23 U/L (ref 15–41)
Alkaline Phosphatase: 66 U/L (ref 38–126)
BILIRUBIN TOTAL: 0.6 mg/dL (ref 0.3–1.2)
BUN: 17 mg/dL (ref 6–20)
CO2: 26 mmol/L (ref 22–32)
Calcium: 8.8 mg/dL — ABNORMAL LOW (ref 8.9–10.3)
Chloride: 105 mmol/L (ref 101–111)
Creatinine, Ser: 0.87 mg/dL (ref 0.61–1.24)
GFR calc Af Amer: >60 mL/min (ref >60)
GFR calc non Af Amer: >60 mL/min (ref >60)
GLUCOSE: 147 mg/dL — AB (ref 65–99)
POTASSIUM: 3.9 mmol/L (ref 3.5–5.1)
SODIUM: 138 mmol/L (ref 135–145)
Total Protein: 6.9 g/dL (ref 6.5–8.1)

## 2017-09-01 MED ORDER — SODIUM CHLORIDE 0.9 % IV SOLN
750.0000 mg/m2 | Freq: Once | INTRAVENOUS | Status: AC
  Administered 2017-09-01: 1660 mg via INTRAVENOUS
  Filled 2017-09-01: qty 83

## 2017-09-01 MED ORDER — SODIUM CHLORIDE 0.9 % IV SOLN
Freq: Once | INTRAVENOUS | Status: AC
  Administered 2017-09-01: 09:00:00 via INTRAVENOUS
  Filled 2017-09-01: qty 1000

## 2017-09-01 MED ORDER — VINCRISTINE SULFATE CHEMO INJECTION 1 MG/ML
2.0000 mg | Freq: Once | INTRAVENOUS | Status: AC
  Administered 2017-09-01: 2 mg via INTRAVENOUS
  Filled 2017-09-01: qty 2

## 2017-09-01 MED ORDER — SODIUM CHLORIDE 0.9 % IV SOLN: 375.0000 mg/m2 | Freq: Once | INTRAVENOUS | Status: DC

## 2017-09-01 MED ORDER — DOXORUBICIN HCL CHEMO IV INJECTION 2 MG/ML
50.0000 mg/m2 | Freq: Once | INTRAVENOUS | Status: AC
  Administered 2017-09-01: 110 mg via INTRAVENOUS
  Filled 2017-09-01: qty 55

## 2017-09-01 MED ORDER — PALONOSETRON HCL INJECTION 0.25 MG/5ML
0.2500 mg | Freq: Once | INTRAVENOUS | Status: AC
  Administered 2017-09-01: 0.25 mg via INTRAVENOUS
  Filled 2017-09-01: qty 5

## 2017-09-01 MED ORDER — SODIUM CHLORIDE 0.9% FLUSH
10.0000 mL | INTRAVENOUS | Status: DC | PRN
  Administered 2017-09-01: 10 mL via INTRAVENOUS
  Filled 2017-09-01: qty 10

## 2017-09-01 MED ORDER — HEPARIN SOD (PORK) LOCK FLUSH 100 UNIT/ML IV SOLN
500.0000 [IU] | Freq: Once | INTRAVENOUS | Status: AC
  Administered 2017-09-01: 500 [IU] via INTRAVENOUS
  Filled 2017-09-01: qty 5

## 2017-09-01 MED ORDER — ACETAMINOPHEN 325 MG PO TABS
650.0000 mg | ORAL_TABLET | Freq: Once | ORAL | Status: AC
  Administered 2017-09-01: 650 mg via ORAL
  Filled 2017-09-01: qty 2

## 2017-09-01 MED ORDER — PEGFILGRASTIM 6 MG/0.6ML ~~LOC~~ PSKT
6.0000 mg | PREFILLED_SYRINGE | Freq: Once | SUBCUTANEOUS | Status: AC
  Administered 2017-09-01: 6 mg via SUBCUTANEOUS
  Filled 2017-09-01: qty 0.6

## 2017-09-01 MED ORDER — DIPHENHYDRAMINE HCL 25 MG PO CAPS
50.0000 mg | ORAL_CAPSULE | Freq: Once | ORAL | Status: AC
  Administered 2017-09-01: 50 mg via ORAL
  Filled 2017-09-01: qty 2

## 2017-09-01 MED ORDER — DEXAMETHASONE SODIUM PHOSPHATE 10 MG/ML IJ SOLN
10.0000 mg | Freq: Once | INTRAMUSCULAR | Status: AC
Start: 2017-09-01 — End: 2017-09-01
  Administered 2017-09-01: 10 mg via INTRAVENOUS
  Filled 2017-09-01: qty 1

## 2017-09-01 MED ORDER — SODIUM CHLORIDE 0.9 % IV SOLN
375.0000 mg/m2 | Freq: Once | INTRAVENOUS | Status: AC
  Administered 2017-09-01: 800 mg via INTRAVENOUS
  Filled 2017-09-01: qty 50

## 2017-09-01 NOTE — Progress Notes (Signed)
Patient here for lymphoma follow-up. He has no medical complaints.

## 2017-09-01 NOTE — Assessment & Plan Note (Addendum)
#   Diffuse large B-cell lymphoma of the stomach;STAGE I E. status post 1 cycle of R CHOP chemotherapy. Tolerated well.  # Proceed with cycle #2 of R CHOP chemotherapy. Labs today reviewed;  acceptable for treatment today. Plan is to check with radiation post cycle #3 of R-CHOP.   # follow up 10 days;-labs; follow up with 3 weeks/labs/chemo; onpro. Will referral to Dr.Crystal.

## 2017-09-01 NOTE — Progress Notes (Signed)
[facility] NOTE  Patient Care Team: Olin Hauser, DO as PCP - General (Family Medicine)  CHIEF COMPLAINTS/PURPOSE OF CONSULTATION:  Diffuse large B-cell lymphoma  #  Oncology History   # AUG 2018- DIFFUSE LARGE B CELL LYMPHOMA STOMACH-STAGE IE [BMBx-NEG]Fundus ;CD-20 pos;variable- bcl6/mum-?? GCB vs. ABC subtype;  [EGD for IDA; Dr.Hung; [place]]- NEG for FISH for;myc; bcl-6; bcl-2;11:14; MALT [8q21];NEG for CD-10; ki-67-70%; SEP PET- uptake in stomach. Hepatitis panel-NEG; BMBx-NEG  # SEP 25th 2018- R-CHOP  # AUG 2018- H.pylori positive/stomach body.   # IDA-resolved  # CAD [s/p CABG; Dr.fath];MUGA scan [sep 2018]- 56.5%     Lymphoma of fundus of stomach (HCC)     HISTORY OF PRESENTING ILLNESS:  Corey Daniel 71 y.o.  male with above history of diffuse large B-cell lymphoma of the stomach; Stage IE is s/p R CHOP chemotherapy #1- appx 3 weeks ago.  Patient denies any unusual fevers or chills. Complained of mild fatigue postchemotherapy; currently energy levels are adequate. Denies any abdominal pain. Patient denies any weight loss. Denies any night sweats. Denies any lumps or bumps. No sores in the mouth. No headaches. No tingling and numbness. He lost his hair after chemotherapy.  ROS: A complete 10 point review of system is done which is negative except mentioned above in history of present illness  MEDICAL HISTORY:  Past Medical History:  Diagnosis Date  . Arthritis   . ASCVD (arteriosclerotic cardiovascular disease)   . Back pain    with radiation  . Cancer ([place])    lymphoma  . Coronary artery disease   . Hyperlipidemia   . Hypertension   . IDA (iron deficiency anemia)   . Lower leg pain   . Myocardial infarction [facility])     SURGICAL HISTORY: Past Surgical History:  Procedure Laterality Date  . AUTOGRAFT STRUCTURAL ICBG OBTAINED SEPARATE INCISION FOR SPINE SURGERY   Bilateral 07/15/2013  . BACK SURGERY    . CATARACT EXTRACTION   2010  . COLONOSCOPY  06/2017  . CORONARY ARTERY BYPASS GRAFT    . IR FLUORO GUIDE PORT INSERTION LEFT  07/31/2017  . LAMINECTOMY LUMBAR EXCISON/EVACUATION EXTRADURAL INTRASPINAL LESION Bilateral 07/15/2013     . LAMINOTOMY REEXPLORATION POSTERIOR LUMBAR W/NERVE DECOMP & DISCECTOMY Bilateral 07/15/2013     . UPPER GI ENDOSCOPY  06/2017    SOCIAL HISTORY: He used to be in the [employer] 30-pack-year history of smoking quit 30 years ago. No alcohol. Lives with his wife in [place].  Social History   Social History  . Marital status: Married    Spouse name: N/A  . Number of children: N/A  . Years of education: N/A   Occupational History  . Not on file.   Social History Main Topics  . Smoking status: Former Smoker    Packs/day: 0.50    Years: 30.00    Types: Cigarettes    Quit date: 20  . Smokeless tobacco: Never Used  . Alcohol use No  . Drug use: No  . Sexual activity: Yes   Other Topics Concern  . Not on file   Social History Narrative  . No narrative on file    FAMILY HISTORY:Father with Hodgkin's lymphoma in the 11s. Family History  Problem Relation Age of Onset  . Heart disease Mother   . Coarctation of the aorta Brother     ALLERGIES:  has No Known Allergies.  MEDICATIONS:  Current Outpatient Prescriptions  Medication Sig Dispense Refill  . carvedilol (COREG) 3.125 MG  tablet Take 1 tablet (3.125 mg total) by mouth 2 (two) times daily with a meal. 180 tablet 3  . Cholecalciferol (VITAMIN D3) 2000 UNITS capsule Take 2,000 Units by mouth daily.     . Coenzyme Q10 100 MG capsule Take by mouth.    . ezetimibe (ZETIA) 10 MG tablet Take 1 tablet (10 mg total) by mouth at bedtime. 90 tablet 3  . Fish Oil-Cholecalciferol (FISH OIL + D3) 1000-1000 MG-UNIT CAPS Take by mouth.    . lidocaine-prilocaine (EMLA) cream Apply 1 application topically as needed. Apply generously over the Mediport 45 minutes prior to chemotherapy. 30 g 0  . lisinopril (ZESTRIL) 2.5 MG tablet Take 1  tablet (2.5 mg total) by mouth daily. 90 tablet 3  . predniSONE (DELTASONE) 50 MG tablet Take 2 tablets once day x 5 days. START on day of your chemotherapy. Take with food. 10 tablet 4  . rosuvastatin (CRESTOR) 5 MG tablet Take 1 tablet (5 mg total) by mouth 3 (three) times a week. 3 times weekly (Patient taking differently: Take 5 mg by mouth 2 (two) times a week. Take 1 tablet 2 times weekly.) 36 tablet 3  . aspirin 81 MG chewable tablet Chew 81 mg by mouth daily.     . ondansetron (ZOFRAN) 8 MG tablet Take 1 tablet (8 mg total) by mouth every 8 (eight) hours as needed for nausea or vomiting (start 3 days; after chemo). (Patient not taking: Reported on 09/01/2017) 40 tablet 1  . prochlorperazine (COMPAZINE) 10 MG tablet Take 1 tablet (10 mg total) by mouth every 6 (six) hours as needed for nausea or vomiting. (Patient not taking: Reported on 09/01/2017) 40 tablet 1   No current facility-administered medications for this visit.    Facility-Administered Medications Ordered in Other Visits  Medication Dose Route Frequency Provider Last Rate Last Dose  . heparin lock flush 100 unit/mL  500 Units Intravenous Once Charlaine Dalton R, MD      . sodium chloride flush (NS) 0.9 % injection 10 mL  10 mL Intravenous PRN Cammie Sickle, MD   10 mL at 09/01/17 0841      .  PHYSICAL EXAMINATION: ECOG PERFORMANCE STATUS: 0 - Asymptomatic  Vitals:   09/01/17 0848  BP: 116/80  Pulse: 68  Resp: 18  Temp: (!) 96.2 F (35.7 C)   Filed Weights   09/01/17 0848  Weight: 216 lb 0.8 oz (98 kg)    GENERAL: Well-nourished well-developed; Alert, no distress and comfortable.  He is alone. EYES: no pallor or icterus OROPHARYNX: no thrush or ulceration; good dentition  NECK: supple, no masses felt LYMPH:  no palpable lymphadenopathy in the cervical, axillary or inguinal regions LUNGS: clear to auscultation and  No wheeze or crackles HEART/CVS: regular rate & rhythm and no murmurs; No lower  extremity edema ABDOMEN: abdomen soft, non-tender and normal bowel sounds Musculoskeletal:no cyanosis of digits and no clubbing  PSYCH: alert & oriented x 3 with fluent speech NEURO: no focal motor/sensory deficits SKIN:  no rashes or significant lesions; positive for alopecia.  LABORATORY DATA:  I have reviewed the data as listed Lab Results  Component Value Date   WBC 6.4 09/01/2017   HGB 13.1 09/01/2017   HCT 38.5 (L) 09/01/2017   MCV 86.8 09/01/2017   PLT 206 09/01/2017    Recent Labs  07/21/17 1544 08/11/17 0843 08/21/17 0908 09/01/17 0824  NA 137 137 135 138  K 4.6 4.1 3.9 3.9  CL 103 104 100* 105  CO2 _0 GLUCOSE 106* 115* 123* 147*  BUN 15 17 22* 17  CREATININE 1.07 0.87 0.90 0.87  CALCIUM 9.2 8.8* 8.3* 8.8*  GFRNONAA >60 >60 >60 >60  GFRAA >60 >60 >60 >60  PROT 7.9 7.4  --  6.9  ALBUMIN 4.4 4.1  --  3.7  AST 32 25  --  23  ALT 29 23  --  23  ALKPHOS 69 65  --  66  BILITOT 0.7 0.6  --  0.6   IMPRESSION: 1. Isolated hypermetabolism in the gastric fundus, consistent with the clinical history of lymphoma. 2. Palatine tonsil hypermetabolism is likely physiologic. Recommend attention on follow-up. 3. Bilateral nonspecific pulmonary nodules also warrant followup attention. 4.  Aortic Atherosclerosis (ICD10-I70.0). 5. Bilateral femoral head avascular necrosis. 6. Hepatic steatosis.   Electronically Signed   By: Abigail Miyamoto M.D.   On: 07/29/2017 14:19 RADIOGRAPHIC STUDIES: I have personally reviewed the radiological images as listed and agreed with the findings in the report. Nm Cardiac Muga Rest  Result Date: 08/07/2017 CLINICAL DATA:  Lymphoma.  Pre chemotherapy. EXAM: NUCLEAR MEDICINE CARDIAC BLOOD POOL IMAGING (MUGA) TECHNIQUE: Cardiac multi-gated acquisition was performed at rest following intravenous injection of Tc-60mlabeled red blood cells. RADIOPHARMACEUTICALS:  21.93 MCi Tc-957mDP in-vitro labeled red blood cells IV COMPARISON:   None. FINDINGS: The cine images demonstrate normal left ventricular wall motion. No akinetic or dyskinetic segments are identified. The ejection fraction was calculated at 56.5%. IMPRESSION: Normal left ventricular wall motion with ejection fraction of 56.5%. Electronically Signed   By: P.Marijo Sanes.D.   On: 08/07/2017 15:16    ASSESSMENT & PLAN:   Lymphoma of fundus of stomach ([place]# Diffuse large B-cell lymphoma of the stomach;STAGE I E. status post 1 cycle of R CHOP chemotherapy. Tolerated well.  # Proceed with cycle #2 of R CHOP chemotherapy. Labs today reviewed;  acceptable for treatment today. Plan is to check with radiation post cycle #3 of R-CHOP.   # follow up 10 days;-labs; follow up with 3 weeks/labs/chemo; onpro. Will referral to Dr.Crystal.   All questions were answered. The patient knows to call the clinic with any problems, questions or concerns.    GoCammie SickleMD 09/01/2017 9:00 AM

## 2017-09-07 ENCOUNTER — Encounter: Payer: Self-pay | Admitting: Radiation Oncology

## 2017-09-07 ENCOUNTER — Ambulatory Visit
Admission: RE | Admit: 2017-09-07 | Discharge: 2017-09-07 | Disposition: A | Discharge status: Home / Self Care | Payer: Medicare Other | Source: Ambulatory Visit | Attending: Radiation Oncology | Admitting: Radiation Oncology

## 2017-09-07 VITALS — BP 121/72 | HR 80 | Temp 96.9°F | Resp 20 | Wt 218.8 lb

## 2017-09-07 DIAGNOSIS — I252 Old myocardial infarction: Secondary | ICD-10-CM | POA: Diagnosis not present

## 2017-09-07 DIAGNOSIS — C8333 Diffuse large B-cell lymphoma, intra-abdominal lymph nodes: Secondary | ICD-10-CM | POA: Insufficient documentation

## 2017-09-07 DIAGNOSIS — Z7982 Long term (current) use of aspirin: Secondary | ICD-10-CM | POA: Diagnosis not present

## 2017-09-07 DIAGNOSIS — Z79899 Other long term (current) drug therapy: Secondary | ICD-10-CM | POA: Diagnosis not present

## 2017-09-07 DIAGNOSIS — M549 Dorsalgia, unspecified: Secondary | ICD-10-CM | POA: Insufficient documentation

## 2017-09-07 DIAGNOSIS — C8599 Non-Hodgkin lymphoma, unspecified, extranodal and solid organ sites: Secondary | ICD-10-CM

## 2017-09-07 DIAGNOSIS — Z510 Encounter for antineoplastic radiation therapy: Secondary | ICD-10-CM | POA: Insufficient documentation

## 2017-09-07 DIAGNOSIS — Z951 Presence of aortocoronary bypass graft: Secondary | ICD-10-CM | POA: Diagnosis not present

## 2017-09-07 DIAGNOSIS — Z87891 Personal history of nicotine dependence: Secondary | ICD-10-CM | POA: Insufficient documentation

## 2017-09-07 DIAGNOSIS — D509 Iron deficiency anemia, unspecified: Secondary | ICD-10-CM | POA: Diagnosis not present

## 2017-09-07 DIAGNOSIS — B9681 Helicobacter pylori [H. pylori] as the cause of diseases classified elsewhere: Secondary | ICD-10-CM | POA: Diagnosis not present

## 2017-09-07 DIAGNOSIS — I10 Essential (primary) hypertension: Secondary | ICD-10-CM | POA: Diagnosis not present

## 2017-09-07 DIAGNOSIS — Z9221 Personal history of antineoplastic chemotherapy: Secondary | ICD-10-CM | POA: Diagnosis not present

## 2017-09-07 DIAGNOSIS — E785 Hyperlipidemia, unspecified: Secondary | ICD-10-CM | POA: Diagnosis not present

## 2017-09-07 DIAGNOSIS — M129 Arthropathy, unspecified: Secondary | ICD-10-CM | POA: Insufficient documentation

## 2017-09-07 DIAGNOSIS — I2510 Atherosclerotic heart disease of native coronary artery without angina pectoris: Secondary | ICD-10-CM | POA: Insufficient documentation

## 2017-09-07 NOTE — Consult Note (Signed)
NEW PATIENT EVALUATION  Name: Corey Daniel  MRN: [id number]  Date:   09/07/2017     DOB: [birth year]   This 71 y.o. male patient presents to the clinic for initial evaluation of diffuse large B-cell lymphoma of the stomach stage IA status post initial chemotherapy for consideration of involved field treatment.  REFERRING PHYSICIAN: [name] *  CHIEF COMPLAINT:  Chief Complaint  Patient presents with  . Cancer    Pt is here for lymphoma of stomach    DIAGNOSIS: The encounter diagnosis was Lymphoma of fundus of stomach ([place]).   PREVIOUS INVESTIGATIONS:  PET CT scan reviewed Pathology report reviewed Clinical notes reviewed  HPI: Patient is a 71 year old male who presented with some dyspepsia underwent upper endoscopy showing a mass of the fundus of the stomach biopsy positive for diffuse large B-cell lymphoma CD20 positive. Patient also had H. pylori positive in his stomach. Patient does have a asked medical history of coronary artery disease status post bypass graft. PET CT scan showed hypermetabolic activity in the fundus of the stomach no other evidence of disease. Bone marrow biopsy was negative. Patient is undergone 2 cycles of R CHOP chemotherapy and is scheduled for his last treatment in early November. He is tolerated his treatments well specifically denies fever chills or night sweats. He is not having any dyspepsia at this point in time and no nausea. I been asked to evaluate the patient for possible involved field radiation after his third cycle of R CHOP.  PLANNED TREATMENT REGIMEN: Involved field radiation  PAST MEDICAL HISTORY:  has a past medical history of Arthritis; ASCVD (arteriosclerotic cardiovascular disease); Back pain; Cancer ([place]); Coronary artery disease; Hyperlipidemia; Hypertension; IDA (iron deficiency anemia); Lower leg pain; and Myocardial infarction ([place]).    PAST SURGICAL HISTORY:  Past Surgical History:  Procedure Laterality Date  .  AUTOGRAFT STRUCTURAL ICBG OBTAINED SEPARATE INCISION FOR SPINE SURGERY   Bilateral 07/15/2013  . BACK SURGERY    . CATARACT EXTRACTION  2010  . COLONOSCOPY  06/2017  . CORONARY ARTERY BYPASS GRAFT    . IR FLUORO GUIDE PORT INSERTION LEFT  07/31/2017  . LAMINECTOMY LUMBAR EXCISON/EVACUATION EXTRADURAL INTRASPINAL LESION Bilateral 07/15/2013     . LAMINOTOMY REEXPLORATION POSTERIOR LUMBAR W/NERVE DECOMP & DISCECTOMY Bilateral 07/15/2013     . UPPER GI ENDOSCOPY  06/2017    FAMILY HISTORY: family history includes Coarctation of the aorta in his brother; Heart disease in his mother.  SOCIAL HISTORY:  reports that he quit smoking about 35 years ago. His smoking use included Cigarettes. He has a 15.00 pack-year smoking history. He has never used smokeless tobacco. He reports that he does not drink alcohol or use drugs.  ALLERGIES: Patient has no known allergies.  MEDICATIONS:  Current Outpatient Prescriptions  Medication Sig Dispense Refill  . carvedilol (COREG) 3.125 MG tablet Take 1 tablet (3.125 mg total) by mouth 2 (two) times daily with a meal. 180 tablet 3  . Cholecalciferol (VITAMIN D3) 2000 UNITS capsule Take 2,000 Units by mouth daily.     . Coenzyme Q10 100 MG capsule Take by mouth.    . ezetimibe (ZETIA) 10 MG tablet Take 1 tablet (10 mg total) by mouth at bedtime. 90 tablet 3  . Fish Oil-Cholecalciferol (FISH OIL + D3) 1000-1000 MG-UNIT CAPS Take by mouth.    . lidocaine-prilocaine (EMLA) cream Apply 1 application topically as needed. Apply generously over the Mediport 45 minutes prior to chemotherapy. 30 g 0  . lisinopril (ZESTRIL) 2.5 MG  tablet Take 1 tablet (2.5 mg total) by mouth daily. 90 tablet 3  . omeprazole (PRILOSEC) 40 MG capsule daily as needed.     . predniSONE (DELTASONE) 50 MG tablet Take 2 tablets once day x 5 days. START on day of your chemotherapy. Take with food. 10 tablet 4  . rosuvastatin (CRESTOR) 5 MG tablet Take 1 tablet (5 mg total) by mouth 3 (three) times a  week. 3 times weekly (Patient taking differently: Take 5 mg by mouth 2 (two) times a week. Take 1 tablet 2 times weekly.) 36 tablet 3  . aspirin 81 MG chewable tablet Chew 81 mg by mouth daily.     . ondansetron (ZOFRAN) 8 MG tablet Take 1 tablet (8 mg total) by mouth every 8 (eight) hours as needed for nausea or vomiting (start 3 days; after chemo). (Patient not taking: Reported on 09/01/2017) 40 tablet 1  . prochlorperazine (COMPAZINE) 10 MG tablet Take 1 tablet (10 mg total) by mouth every 6 (six) hours as needed for nausea or vomiting. (Patient not taking: Reported on 09/01/2017) 40 tablet 1   No current facility-administered medications for this encounter.     ECOG PERFORMANCE STATUS:  0 - Asymptomatic  REVIEW OF SYSTEMS:  Patient denies any weight loss, fatigue, weakness, fever, chills or night sweats. Patient denies any loss of vision, blurred vision. Patient denies any ringing  of the ears or hearing loss. No irregular heartbeat. Patient denies heart murmur or history of fainting. Patient denies any chest pain or pain radiating to her upper extremities. Patient denies any shortness of breath, difficulty breathing at night, cough or hemoptysis. Patient denies any swelling in the lower legs. Patient denies any nausea vomiting, vomiting of blood, or coffee ground material in the vomitus. Patient denies any stomach pain. Patient states has had normal bowel movements no significant constipation or diarrhea. Patient denies any dysuria, hematuria or significant nocturia. Patient denies any problems walking, swelling in the joints or loss of balance. Patient denies any skin changes, loss of hair or loss of weight. Patient denies any excessive worrying or anxiety or significant depression. Patient denies any problems with insomnia. Patient denies excessive thirst, polyuria, polydipsia. Patient denies any swollen glands, patient denies easy bruising or easy bleeding. Patient denies any recent infections,  allergies or URI. Patient "s visual fields have not changed significantly in recent time.    PHYSICAL EXAM: BP 121/72   Pulse 80   Temp (!) 96.9 F (36.1 C)   Resp 20   Wt 218 lb 12.9 oz (99.2 kg)   BMI 31.40 kg/m  Well-developed well-nourished patient in NAD. HEENT reveals PERLA, EOMI, discs not visualized.  Oral cavity is clear. No oral mucosal lesions are identified. Neck is clear without evidence of cervical or supraclavicular adenopathy. Lungs are clear to A&P. Cardiac examination is essentially unremarkable with regular rate and rhythm without murmur rub or thrill. Abdomen is benign with no organomegaly or masses noted. Motor sensory and DTR levels are equal and symmetric in the upper and lower extremities. Cranial nerves II through XII are grossly intact. Proprioception is intact. No peripheral adenopathy or edema is identified. No motor or sensory levels are noted. Crude visual fields are within normal range.  LABORATORY DATA: Pathology reports reviewed    RADIOLOGY RESULTS: PET CT scans reviewed   IMPRESSION: Stage I a diffuse large B-cell lymphoma of the fundus of the stomach status post initial chemotherapy in 71 year old male  PLAN: At this time I to go  ahead after his completion of his third cycle of R CHOP with involved field radiation therapy. There is been some debate about the dose although 4140 cGy in 23 fractions using CT fusion study for treatment planning purposes would be a recommended dose. Risks and benefits of treatment including potential nausea fatigue alteration of blood counts possible dysphasia all were described in detail to the patient. I have personally set up and ordered CT simulation towards the end of November after completion of his last and third cycle of R CHOP chemotherapy.There will be extra effort by both professional staff as well as technical staff to coordinate and manage concurrent chemoradiation and ensuing side effects during his  treatments. Patient copy has my treatment plan well.  I would like to take this opportunity to thank you for allowing me to participate in the care of your patient.Armstead Peaks., MD

## 2017-09-11 ENCOUNTER — Inpatient Hospital Stay: Payer: Medicare Other

## 2017-09-11 DIAGNOSIS — R918 Other nonspecific abnormal finding of lung field: Secondary | ICD-10-CM | POA: Diagnosis not present

## 2017-09-11 DIAGNOSIS — I700 Atherosclerosis of aorta: Secondary | ICD-10-CM | POA: Diagnosis not present

## 2017-09-11 DIAGNOSIS — Z5112 Encounter for antineoplastic immunotherapy: Secondary | ICD-10-CM | POA: Diagnosis not present

## 2017-09-11 DIAGNOSIS — C8599 Non-Hodgkin lymphoma, unspecified, extranodal and solid organ sites: Secondary | ICD-10-CM

## 2017-09-11 DIAGNOSIS — Z5111 Encounter for antineoplastic chemotherapy: Secondary | ICD-10-CM | POA: Diagnosis not present

## 2017-09-11 DIAGNOSIS — Z7689 Persons encountering health services in other specified circumstances: Secondary | ICD-10-CM | POA: Diagnosis not present

## 2017-09-11 DIAGNOSIS — C8339 Diffuse large B-cell lymphoma, extranodal and solid organ sites: Secondary | ICD-10-CM | POA: Diagnosis not present

## 2017-09-11 LAB — BASIC METABOLIC PANEL
ANION GAP: 7 (ref 5–15)
BUN: 15 mg/dL (ref 6–20)
CALCIUM: 8.7 mg/dL — AB (ref 8.9–10.3)
CO2: 28 mmol/L (ref 22–32)
Chloride: 102 mmol/L (ref 101–111)
Creatinine, Ser: 1.07 mg/dL (ref 0.61–1.24)
GLUCOSE: 136 mg/dL — AB (ref 65–99)
Potassium: 4 mmol/L (ref 3.5–5.1)
SODIUM: 137 mmol/L (ref 135–145)

## 2017-09-11 LAB — CBC WITH DIFFERENTIAL/PLATELET
BASOS ABS: 0.2 10*3/uL — AB (ref 0–0.1)
BASOS PCT: 2 %
EOS ABS: 0.1 10*3/uL (ref 0–0.7)
EOS PCT: 1 %
HCT: 39 % — ABNORMAL LOW (ref 40.0–52.0)
Hemoglobin: 13.1 g/dL (ref 13.0–18.0)
Lymphocytes Relative: 9 %
Lymphs Abs: 1 10*3/uL (ref 1.0–3.6)
MCH: 29.1 pg (ref 26.0–34.0)
MCHC: 33.7 g/dL (ref 32.0–36.0)
MCV: 86.5 fL (ref 80.0–100.0)
MONO ABS: 0.6 10*3/uL (ref 0.2–1.0)
Monocytes Relative: 6 %
Neutro Abs: 8.6 10*3/uL — ABNORMAL HIGH (ref 1.4–6.5)
Neutrophils Relative %: 82 %
PLATELETS: 105 10*3/uL — AB (ref 150–440)
RBC: 4.51 MIL/uL (ref 4.40–5.90)
RDW: 14.9 % — AB (ref 11.5–14.5)
WBC: 10.3 10*3/uL (ref 3.8–10.6)

## 2017-09-22 ENCOUNTER — Inpatient Hospital Stay: Payer: Medicare Other | Attending: Internal Medicine | Admitting: Internal Medicine

## 2017-09-22 ENCOUNTER — Inpatient Hospital Stay: Payer: Medicare Other

## 2017-09-22 VITALS — BP 139/86 | HR 80 | Temp 95.7°F | Resp 18 | Wt 215.8 lb

## 2017-09-22 VITALS — BP 130/74 | HR 74 | Temp 97.3°F | Resp 18

## 2017-09-22 DIAGNOSIS — I252 Old myocardial infarction: Secondary | ICD-10-CM | POA: Insufficient documentation

## 2017-09-22 DIAGNOSIS — K760 Fatty (change of) liver, not elsewhere classified: Secondary | ICD-10-CM | POA: Diagnosis not present

## 2017-09-22 DIAGNOSIS — Z5112 Encounter for antineoplastic immunotherapy: Secondary | ICD-10-CM | POA: Diagnosis not present

## 2017-09-22 DIAGNOSIS — M129 Arthropathy, unspecified: Secondary | ICD-10-CM | POA: Insufficient documentation

## 2017-09-22 DIAGNOSIS — R918 Other nonspecific abnormal finding of lung field: Secondary | ICD-10-CM | POA: Diagnosis not present

## 2017-09-22 DIAGNOSIS — C8339 Diffuse large B-cell lymphoma, extranodal and solid organ sites: Secondary | ICD-10-CM | POA: Insufficient documentation

## 2017-09-22 DIAGNOSIS — I2510 Atherosclerotic heart disease of native coronary artery without angina pectoris: Secondary | ICD-10-CM

## 2017-09-22 DIAGNOSIS — Z87891 Personal history of nicotine dependence: Secondary | ICD-10-CM | POA: Insufficient documentation

## 2017-09-22 DIAGNOSIS — I10 Essential (primary) hypertension: Secondary | ICD-10-CM | POA: Diagnosis not present

## 2017-09-22 DIAGNOSIS — I700 Atherosclerosis of aorta: Secondary | ICD-10-CM | POA: Insufficient documentation

## 2017-09-22 DIAGNOSIS — Z8619 Personal history of other infectious and parasitic diseases: Secondary | ICD-10-CM | POA: Diagnosis not present

## 2017-09-22 DIAGNOSIS — M549 Dorsalgia, unspecified: Secondary | ICD-10-CM | POA: Insufficient documentation

## 2017-09-22 DIAGNOSIS — Z7689 Persons encountering health services in other specified circumstances: Secondary | ICD-10-CM | POA: Diagnosis not present

## 2017-09-22 DIAGNOSIS — M879 Osteonecrosis, unspecified: Secondary | ICD-10-CM | POA: Diagnosis not present

## 2017-09-22 DIAGNOSIS — Z5111 Encounter for antineoplastic chemotherapy: Secondary | ICD-10-CM | POA: Insufficient documentation

## 2017-09-22 DIAGNOSIS — K1230 Oral mucositis (ulcerative), unspecified: Secondary | ICD-10-CM | POA: Diagnosis not present

## 2017-09-22 DIAGNOSIS — C8599 Non-Hodgkin lymphoma, unspecified, extranodal and solid organ sites: Secondary | ICD-10-CM

## 2017-09-22 DIAGNOSIS — Z79899 Other long term (current) drug therapy: Secondary | ICD-10-CM | POA: Insufficient documentation

## 2017-09-22 DIAGNOSIS — E785 Hyperlipidemia, unspecified: Secondary | ICD-10-CM | POA: Insufficient documentation

## 2017-09-22 DIAGNOSIS — D509 Iron deficiency anemia, unspecified: Secondary | ICD-10-CM | POA: Diagnosis not present

## 2017-09-22 LAB — BASIC METABOLIC PANEL
Anion gap: 8 (ref 5–15)
BUN: 18 mg/dL (ref 6–20)
CHLORIDE: 106 mmol/L (ref 101–111)
CO2: 24 mmol/L (ref 22–32)
CREATININE: 0.88 mg/dL (ref 0.61–1.24)
Calcium: 8.9 mg/dL (ref 8.9–10.3)
GFR calc Af Amer: >60 mL/min (ref >60)
GFR calc non Af Amer: >60 mL/min (ref >60)
Glucose, Bld: 129 mg/dL — ABNORMAL HIGH (ref 65–99)
Potassium: 4.1 mmol/L (ref 3.5–5.1)
SODIUM: 138 mmol/L (ref 135–145)

## 2017-09-22 LAB — CBC WITH DIFFERENTIAL/PLATELET
Basophils Absolute: 0.2 10*3/uL — ABNORMAL HIGH (ref 0–0.1)
Basophils Relative: 2 %
EOS ABS: 0 10*3/uL (ref 0–0.7)
EOS PCT: 0 %
HCT: 37 % — ABNORMAL LOW (ref 40.0–52.0)
Hemoglobin: 12.5 g/dL — ABNORMAL LOW (ref 13.0–18.0)
LYMPHS ABS: 1 10*3/uL (ref 1.0–3.6)
Lymphocytes Relative: 14 %
MCH: 29.5 pg (ref 26.0–34.0)
MCHC: 33.7 g/dL (ref 32.0–36.0)
MCV: 87.4 fL (ref 80.0–100.0)
MONOS PCT: 14 %
Monocytes Absolute: 1.1 10*3/uL — ABNORMAL HIGH (ref 0.2–1.0)
Neutro Abs: 5.2 10*3/uL (ref 1.4–6.5)
Neutrophils Relative %: 70 %
PLATELETS: 208 10*3/uL (ref 150–440)
RBC: 4.24 MIL/uL — ABNORMAL LOW (ref 4.40–5.90)
RDW: 16.4 % — ABNORMAL HIGH (ref 11.5–14.5)
WBC: 7.5 10*3/uL (ref 3.8–10.6)

## 2017-09-22 MED ORDER — SODIUM CHLORIDE 0.9 % IV SOLN: 375.0000 mg/m2 | Freq: Once | INTRAVENOUS | Status: DC

## 2017-09-22 MED ORDER — VINCRISTINE SULFATE CHEMO INJECTION 1 MG/ML
2.0000 mg | Freq: Once | INTRAVENOUS | Status: AC
  Administered 2017-09-22: 2 mg via INTRAVENOUS
  Filled 2017-09-22: qty 2

## 2017-09-22 MED ORDER — DOXORUBICIN HCL CHEMO IV INJECTION 2 MG/ML
50.0000 mg/m2 | Freq: Once | INTRAVENOUS | Status: AC
  Administered 2017-09-22: 110 mg via INTRAVENOUS
  Filled 2017-09-22: qty 55

## 2017-09-22 MED ORDER — HEPARIN SOD (PORK) LOCK FLUSH 100 UNIT/ML IV SOLN
500.0000 [IU] | Freq: Once | INTRAVENOUS | Status: AC | PRN
  Administered 2017-09-22: 500 [IU]
  Filled 2017-09-22: qty 5

## 2017-09-22 MED ORDER — DIPHENHYDRAMINE HCL 25 MG PO CAPS
50.0000 mg | ORAL_CAPSULE | Freq: Once | ORAL | Status: AC
  Administered 2017-09-22: 50 mg via ORAL
  Filled 2017-09-22: qty 2

## 2017-09-22 MED ORDER — SODIUM CHLORIDE 0.9% FLUSH
10.0000 mL | INTRAVENOUS | Status: DC | PRN
  Administered 2017-09-22: 10 mL
  Filled 2017-09-22: qty 10

## 2017-09-22 MED ORDER — ACETAMINOPHEN 325 MG PO TABS
650.0000 mg | ORAL_TABLET | Freq: Once | ORAL | Status: AC
  Administered 2017-09-22: 650 mg via ORAL
  Filled 2017-09-22: qty 2

## 2017-09-22 MED ORDER — SODIUM CHLORIDE 0.9 % IV SOLN
Freq: Once | INTRAVENOUS | Status: AC
Start: 2017-09-22 — End: 2017-09-22
  Administered 2017-09-22: 10:00:00 via INTRAVENOUS
  Filled 2017-09-22: qty 1000

## 2017-09-22 MED ORDER — SODIUM CHLORIDE 0.9 % IV SOLN
750.0000 mg/m2 | Freq: Once | INTRAVENOUS | Status: AC
  Administered 2017-09-22: 1660 mg via INTRAVENOUS
  Filled 2017-09-22: qty 50

## 2017-09-22 MED ORDER — SODIUM CHLORIDE 0.9 % IV SOLN
375.0000 mg/m2 | Freq: Once | INTRAVENOUS | Status: AC
  Administered 2017-09-22: 800 mg via INTRAVENOUS
  Filled 2017-09-22: qty 50

## 2017-09-22 MED ORDER — DEXAMETHASONE SODIUM PHOSPHATE 10 MG/ML IJ SOLN
10.0000 mg | Freq: Once | INTRAMUSCULAR | Status: AC
  Administered 2017-09-22: 10 mg via INTRAVENOUS
  Filled 2017-09-22: qty 1

## 2017-09-22 MED ORDER — PEGFILGRASTIM 6 MG/0.6ML ~~LOC~~ PSKT
PREFILLED_SYRINGE | SUBCUTANEOUS | Status: AC
  Filled 2017-09-22: qty 0.6

## 2017-09-22 MED ORDER — PALONOSETRON HCL INJECTION 0.25 MG/5ML
0.2500 mg | Freq: Once | INTRAVENOUS | Status: AC
  Administered 2017-09-22: 0.25 mg via INTRAVENOUS
  Filled 2017-09-22: qty 5

## 2017-09-22 MED ORDER — PEGFILGRASTIM 6 MG/0.6ML ~~LOC~~ PSKT
6.0000 mg | PREFILLED_SYRINGE | Freq: Once | SUBCUTANEOUS | Status: AC
  Administered 2017-09-22: 6 mg via SUBCUTANEOUS
  Filled 2017-09-22: qty 0.6

## 2017-09-22 NOTE — Assessment & Plan Note (Addendum)
#   Diffuse large B-cell lymphoma of the stomach;STAGE I E. status post 2 cycle of R CHOP chemotherapy. Tolerated well.  # Proceed with cycle # 3 of R CHOP chemotherapy. Labs today reviewed;  acceptable for treatment today. Plan radiation post cycle #3 of R-CHOP. Encouraged to call Radiation Oncology   # Mucositis- G-1; recommend salt/baking soda rinses prn.   # follow up in 6 weeks/labs/Tatamy/portflush. No chemo.

## 2017-09-22 NOTE — Progress Notes (Signed)
[facility] NOTE  Patient Care Team: Olin Hauser, DO as PCP - General (Family Medicine)  CHIEF COMPLAINTS/PURPOSE OF CONSULTATION:  Diffuse large B-cell lymphoma  #  Oncology History   # AUG 2018- DIFFUSE LARGE B CELL LYMPHOMA STOMACH-STAGE IE [BMBx-NEG]Fundus ;CD-20 pos;variable- bcl6/mum-?? GCB vs. ABC subtype;  [EGD for IDA; Dr.Hung; [place]]- NEG for FISH for;myc; bcl-6; bcl-2;11:14; MALT [8q21];NEG for CD-10; ki-67-70%; SEP PET- uptake in stomach. Hepatitis panel-NEG; BMBx-NEG  # SEP 25th 2018- R-CHOP  # AUG 2018- H.pylori positive/stomach body.   # IDA-resolved  # CAD [s/p CABG; Dr.fath];MUGA scan [sep 2018]- 56.5%     Lymphoma of fundus of stomach (HCC)     HISTORY OF PRESENTING ILLNESS:  Corey Daniel 71 y.o.  male with above history of diffuse large B-cell lymphoma of the stomach; Stage IE is s/p R CHOP chemotherapy #2- appx 3 weeks ago.   in the interim patient as been evaluated by radiation oncology.    Patient denies ay significant fatige;  Appetite is good. No weight loss.  Patient had mild sores in the mouth which are currently resolved.  He denies any significant tingling and numbess in the extemities.   ROS: A complete 10 point review of system is done which is negative except mentioned above in history of present illness  MEDICAL HISTORY:  Past Medical History:  Diagnosis Date  . Arthritis   . ASCVD (arteriosclerotic cardiovascular disease)   . Back pain    with radiation  . Cancer ([place])    lymphoma  . Coronary artery disease   . Hyperlipidemia   . Hypertension   . IDA (iron deficiency anemia)   . Lower leg pain   . Myocardial infarction [facility])     SURGICAL HISTORY: Past Surgical History:  Procedure Laterality Date  . AUTOGRAFT STRUCTURAL ICBG OBTAINED SEPARATE INCISION FOR SPINE SURGERY   Bilateral 07/15/2013  . BACK SURGERY    . CATARACT EXTRACTION  2010  . COLONOSCOPY  06/2017  . CORONARY ARTERY BYPASS GRAFT     . IR FLUORO GUIDE PORT INSERTION LEFT  07/31/2017  . LAMINECTOMY LUMBAR EXCISON/EVACUATION EXTRADURAL INTRASPINAL LESION Bilateral 07/15/2013     . LAMINOTOMY REEXPLORATION POSTERIOR LUMBAR W/NERVE DECOMP & DISCECTOMY Bilateral 07/15/2013     . UPPER GI ENDOSCOPY  06/2017    SOCIAL HISTORY: He used to be in the [employer] 30-pack-year history of smoking quit 30 years ago. No alcohol. Lives with his wife in [place].  Social History   Socioeconomic History  . Marital status: Married    Spouse name: Not on file  . Number of children: Not on file  . Years of education: Not on file  . Highest education level: Not on file  Social Needs  . Financial resource strain: Not on file  . Food insecurity - worry: Not on file  . Food insecurity - inability: Not on file  . Transportation needs - medical: Not on file  . Transportation needs - non-medical: Not on file  Occupational History  . Not on file  Tobacco Use  . Smoking status: Former Smoker    Packs/day: 0.50    Years: 30.00    Pack years: 15.00    Types: Cigarettes    Last attempt to quit: 1983    Years since quitting: 35.8  . Smokeless tobacco: Never Used  Substance and Sexual Activity  . Alcohol use: No  . Drug use: No  . Sexual activity: Yes  Other Topics Concern  .  Not on file  Social History Narrative  . Not on file    FAMILY HISTORY:Father with Hodgkin's lymphoma in the 81s. Family History  Problem Relation Age of Onset  . Heart disease Mother   . Coarctation of the aorta Brother     ALLERGIES:  has No Known Allergies.  MEDICATIONS:  Current Outpatient Medications  Medication Sig Dispense Refill  . carvedilol (COREG) 3.125 MG tablet Take 1 tablet (3.125 mg total) by mouth 2 (two) times daily with a meal. 180 tablet 3  . Cholecalciferol (VITAMIN D3) 2000 UNITS capsule Take 2,000 Units by mouth daily.     . Coenzyme Q10 100 MG capsule Take by mouth.    . ezetimibe (ZETIA) 10 MG tablet Take 1 tablet (10 mg total) by  mouth at bedtime. 90 tablet 3  . Fish Oil-Cholecalciferol (FISH OIL + D3) 1000-1000 MG-UNIT CAPS Take by mouth.    . lidocaine-prilocaine (EMLA) cream Apply 1 application topically as needed. Apply generously over the Mediport 45 minutes prior to chemotherapy. 30 g 0  . lisinopril (ZESTRIL) 2.5 MG tablet Take 1 tablet (2.5 mg total) by mouth daily. 90 tablet 3  . rosuvastatin (CRESTOR) 5 MG tablet Take 1 tablet (5 mg total) by mouth 3 (three) times a week. 3 times weekly 36 tablet 3  . aspirin 81 MG chewable tablet Chew 81 mg by mouth daily.     . fluticasone (FLONASE) 50 MCG/ACT nasal spray Place into the nose.    Marland Kitchen omeprazole (PRILOSEC) 40 MG capsule daily as needed.     . ondansetron (ZOFRAN) 8 MG tablet Take 1 tablet (8 mg total) by mouth every 8 (eight) hours as needed for nausea or vomiting (start 3 days; after chemo). (Patient not taking: Reported on 09/01/2017) 40 tablet 1  . predniSONE (DELTASONE) 50 MG tablet Take 2 tablets once day x 5 days. START on day of your chemotherapy. Take with food. (Patient not taking: Reported on 09/22/2017) 10 tablet 4  . prochlorperazine (COMPAZINE) 10 MG tablet Take 1 tablet (10 mg total) by mouth every 6 (six) hours as needed for nausea or vomiting. (Patient not taking: Reported on 09/01/2017) 40 tablet 1   No current facility-administered medications for this visit.    Facility-Administered Medications Ordered in Other Visits  Medication Dose Route Frequency Provider Last Rate Last Dose  . cyclophosphamide (CYTOXAN) 1,660 mg in sodium chloride 0.9 % 250 mL chemo infusion  750 mg/m2 (Treatment Plan Recorded) Intravenous Once Cammie Sickle, MD      . dexamethasone (DECADRON) injection 10 mg  10 mg Intravenous Once Charlaine Dalton R, MD      . DOXOrubicin (ADRIAMYCIN) chemo injection 110 mg  50 mg/m2 (Treatment Plan Recorded) Intravenous Once Charlaine Dalton R, MD      . pegfilgrastim (NEULASTA ONPRO KIT) injection 6 mg  6 mg Subcutaneous  Once Charlaine Dalton R, MD      . vinCRIStine (ONCOVIN) 2 mg in sodium chloride 0.9 % 50 mL chemo infusion  2 mg Intravenous Once Cammie Sickle, MD          .  PHYSICAL EXAMINATION: ECOG PERFORMANCE STATUS: 0 - Asymptomatic  Vitals:   09/22/17 0933  BP: 139/86  Pulse: 80  Resp: 18  Temp: (!) 95.7 F (35.4 C)   Filed Weights   09/22/17 0933  Weight: 215 lb 13.3 oz (97.9 kg)    GENERAL: Well-nourished well-developed; Alert, no distress and comfortable.  He is alone. EYES: no pallor or  icterus OROPHARYNX: no thrush or ulceration; good dentition  NECK: supple, no masses felt LYMPH:  no palpable lymphadenopathy in the cervical, axillary or inguinal regions LUNGS: clear to auscultation and  No wheeze or crackles HEART/CVS: regular rate & rhythm and no murmurs; No lower extremity edema ABDOMEN: abdomen soft, non-tender and normal bowel sounds Musculoskeletal:no cyanosis of digits and no clubbing  PSYCH: alert & oriented x 3 with fluent speech NEURO: no focal motor/sensory deficits SKIN:  no rashes or significant lesions; positive for alopecia.  LABORATORY DATA:  I have reviewed the data as listed Lab Results  Component Value Date   WBC 7.5 09/22/2017   HGB 12.5 (L) 09/22/2017   HCT 37.0 (L) 09/22/2017   MCV 87.4 09/22/2017   PLT 208 09/22/2017   Recent Labs    07/21/17 1544 08/11/17 0843  09/01/17 0824 09/11/17 1006 09/22/17 0908  NA 137 137   < > 138 137 138  K 4.6 4.1   < > 3.9 4.0 4.1  CL 103 104   < > 105 102 106  CO2 28 26   < > _0 GLUCOSE 106* 115*   < > 147* 136* 129*  BUN 15 17   < > _1 CREATININE 1.07 0.87   < > 0.87 1.07 0.88  CALCIUM 9.2 8.8*   < > 8.8* 8.7* 8.9  GFRNONAA >60 >60   < > >60 >60 >60  GFRAA >60 >60   < > >60 >60 >60  PROT 7.9 7.4  --  6.9  --   --   ALBUMIN 4.4 4.1  --  3.7  --   --   AST 32 25  --  23  --   --   ALT 29 23  --  23  --   --   ALKPHOS 69 65  --  66  --   --   BILITOT 0.7 0.6  --  0.6  --    --    < > = values in this interval not displayed.   IMPRESSION: 1. Isolated hypermetabolism in the gastric fundus, consistent with the clinical history of lymphoma. 2. [place] tonsil hypermetabolism is likely physiologic. Recommend attention on follow-up. 3. Bilateral nonspecific pulmonary nodules also warrant followup attention. 4.  Aortic Atherosclerosis (ICD10-I70.0). 5. Bilateral femoral head avascular necrosis. 6. Hepatic steatosis.   Electronically Signed   By: Abigail Miyamoto M.D.   On: 07/29/2017 14:19 RADIOGRAPHIC STUDIES: I have personally reviewed the radiological images as listed and agreed with the findings in the report. No results found.  ASSESSMENT & PLAN:   Lymphoma of fundus of stomach ([facility]) # Diffuse large B-cell lymphoma of the stomach;STAGE I E. status post 2 cycle of R CHOP chemotherapy. Tolerated well.  # Proceed with cycle # 3 of R CHOP chemotherapy. Labs today reviewed;  acceptable for treatment today. Plan radiation post cycle #3 of R-CHOP. Encouraged to call Radiation Oncology   # Mucositis- G-1; recommend salt/baking soda rinses prn.   # follow up in 6 weeks/labs/[place]/portflush. No chemo.  All questions were answered. The patient knows to call the clinic with any problems, questions or concerns.    Cammie Sickle, MD 09/22/2017 10:31 AM

## 2017-09-22 NOTE — Progress Notes (Signed)
Here for follow up. Stated doing well  

## 2017-10-06 ENCOUNTER — Ambulatory Visit (INDEPENDENT_AMBULATORY_CARE_PROVIDER_SITE_OTHER): Payer: Medicare Other

## 2017-10-06 ENCOUNTER — Ambulatory Visit
Admission: RE | Admit: 2017-10-06 | Discharge: 2017-10-06 | Disposition: A | Discharge status: Home / Self Care | Payer: Medicare Other | Source: Ambulatory Visit | Attending: Radiation Oncology | Admitting: Radiation Oncology

## 2017-10-06 VITALS — BP 130/82 | HR 78 | Temp 98.5°F | Resp 16 | Ht 70.0 in | Wt 218.4 lb

## 2017-10-06 DIAGNOSIS — D509 Iron deficiency anemia, unspecified: Secondary | ICD-10-CM | POA: Diagnosis not present

## 2017-10-06 DIAGNOSIS — Z951 Presence of aortocoronary bypass graft: Secondary | ICD-10-CM | POA: Diagnosis not present

## 2017-10-06 DIAGNOSIS — I2510 Atherosclerotic heart disease of native coronary artery without angina pectoris: Secondary | ICD-10-CM | POA: Diagnosis not present

## 2017-10-06 DIAGNOSIS — B9681 Helicobacter pylori [H. pylori] as the cause of diseases classified elsewhere: Secondary | ICD-10-CM | POA: Diagnosis not present

## 2017-10-06 DIAGNOSIS — Z0000 Encounter for general adult medical examination without abnormal findings: Secondary | ICD-10-CM | POA: Diagnosis not present

## 2017-10-06 DIAGNOSIS — Z87891 Personal history of nicotine dependence: Secondary | ICD-10-CM | POA: Diagnosis not present

## 2017-10-06 DIAGNOSIS — I10 Essential (primary) hypertension: Secondary | ICD-10-CM | POA: Diagnosis not present

## 2017-10-06 DIAGNOSIS — Z79899 Other long term (current) drug therapy: Secondary | ICD-10-CM | POA: Diagnosis not present

## 2017-10-06 DIAGNOSIS — M549 Dorsalgia, unspecified: Secondary | ICD-10-CM | POA: Diagnosis not present

## 2017-10-06 DIAGNOSIS — C8333 Diffuse large B-cell lymphoma, intra-abdominal lymph nodes: Secondary | ICD-10-CM | POA: Diagnosis not present

## 2017-10-06 DIAGNOSIS — E785 Hyperlipidemia, unspecified: Secondary | ICD-10-CM | POA: Diagnosis not present

## 2017-10-06 DIAGNOSIS — Z9221 Personal history of antineoplastic chemotherapy: Secondary | ICD-10-CM | POA: Diagnosis not present

## 2017-10-06 DIAGNOSIS — I252 Old myocardial infarction: Secondary | ICD-10-CM | POA: Diagnosis not present

## 2017-10-06 DIAGNOSIS — M129 Arthropathy, unspecified: Secondary | ICD-10-CM | POA: Diagnosis not present

## 2017-10-06 DIAGNOSIS — Z7982 Long term (current) use of aspirin: Secondary | ICD-10-CM | POA: Diagnosis not present

## 2017-10-06 NOTE — Patient Instructions (Addendum)
Corey Daniel , Thank you for taking time to come for your Medicare Wellness Visit. I appreciate your ongoing commitment to your health goals. Please review the following plan we discussed and let me know if I can assist you in the future.   Screening recommendations/referrals: Colonoscopy: completed 06/23/2017 Recommended yearly ophthalmology/optometry visit for glaucoma screening and checkup Recommended yearly dental visit for hygiene and checkup  Vaccinations: Influenza vaccine: up to date Pneumococcal vaccine:up to date Tdap vaccine: up to date Shingles vaccine: due, check with your insurance company for coverage   Advanced directives: Advance directive discussed with you today. I have provided a copy for you to complete at home and have notarized. Once this is complete please bring a copy in to our office so we can scan it into your chart.  Conditions/risks identified: Recommend drinking at least 7-8 glasses of water a day   Next appointment: Follow up on 10/21/2017 at 8:00am with [place] 65 Years and Older, Male Preventive care refers to lifestyle choices and visits with your health care provider that can promote health and wellness. What does preventive care include?  A yearly physical exam. This is also called an annual well check.  Dental exams once or twice a year.  Routine eye exams. Ask your health care provider how often you should have your eyes checked.  Personal lifestyle choices, including:  Daily care of your teeth and gums.  Regular physical activity.  Eating a healthy diet.  Avoiding tobacco and drug use.  Limiting alcohol use.  Practicing safe sex.  Taking low doses of aspirin every day.  Taking vitamin and mineral supplements as recommended by your health care provider. What happens during an annual well check? The services and screenings done by your health care provider during your annual well check will depend on your age,  overall health, lifestyle risk factors, and family history of disease. Counseling  Your health care provider may ask you questions about your:  Alcohol use.  Tobacco use.  Drug use.  Emotional well-being.  Home and relationship well-being.  Sexual activity.  Eating habits.  History of falls.  Memory and ability to understand (cognition).  Work and work Statistician. Screening  You may have the following tests or measurements:  Height, weight, and BMI.  Blood pressure.  Lipid and cholesterol levels. These may be checked every 5 years, or more frequently if you are over 36 years old.  Skin check.  Lung cancer screening. You may have this screening every year starting at age 41 if you have a 30-pack-year history of smoking and currently smoke or have quit within the past 15 years.  Fecal occult blood test (FOBT) of the stool. You may have this test every year starting at age 28.  Flexible sigmoidoscopy or colonoscopy. You may have a sigmoidoscopy every 5 years or a colonoscopy every 10 years starting at age 33.  Prostate cancer screening. Recommendations will vary depending on your family history and other risks.  Hepatitis C blood test.  Hepatitis B blood test.  Sexually transmitted disease (STD) testing.  Diabetes screening. This is done by checking your blood sugar (glucose) after you have not eaten for a while (fasting). You may have this done every 1-3 years.  Abdominal aortic aneurysm (AAA) screening. You may need this if you are a current or former smoker.  Osteoporosis. You may be screened starting at age 20 if you are at high risk. Talk with your health care provider about your  test results, treatment options, and if necessary, the need for more tests. Vaccines  Your health care provider may recommend certain vaccines, such as:  Influenza vaccine. This is recommended every year.  Tetanus, diphtheria, and acellular pertussis (Tdap, Td) vaccine. You may  need a Td booster every 10 years.  Zoster vaccine. You may need this after age 1.  Pneumococcal 13-valent conjugate (PCV13) vaccine. One dose is recommended after age 75.  Pneumococcal polysaccharide (PPSV23) vaccine. One dose is recommended after age 81. Talk to your health care provider about which screenings and vaccines you need and how often you need them. This information is not intended to replace advice given to you by your health care provider. Make sure you discuss any questions you have with your health care provider. Document Released: 11/30/2015 Document Revised: 07/23/2016 Document Reviewed: 09/04/2015 Elsevier Interactive Patient Education  2017 [place] Prevention in the Home Falls can cause injuries. They can happen to people of all ages. There are many things you can do to make your home safe and to help prevent falls. What can I do on the outside of my home?  Regularly fix the edges of walkways and driveways and fix any cracks.  Remove anything that might make you trip as you walk through a door, such as a raised step or threshold.  Trim any bushes or trees on the path to your home.  Use bright outdoor lighting.  Clear any walking paths of anything that might make someone trip, such as rocks or tools.  Regularly check to see if handrails are loose or broken. Make sure that both sides of any steps have handrails.  Any raised decks and porches should have guardrails on the edges.  Have any leaves, snow, or ice cleared regularly.  Use sand or salt on walking paths during winter.  Clean up any spills in your garage right away. This includes oil or grease spills. What can I do in the bathroom?  Use night lights.  Install grab bars by the toilet and in the tub and shower. Do not use towel bars as grab bars.  Use non-skid mats or decals in the tub or shower.  If you need to sit down in the shower, use a plastic, non-slip stool.  Keep the floor  dry. Clean up any water that spills on the floor as soon as it happens.  Remove soap buildup in the tub or shower regularly.  Attach bath mats securely with double-sided non-slip rug tape.  Do not have throw rugs and other things on the floor that can make you trip. What can I do in the bedroom?  Use night lights.  Make sure that you have a light by your bed that is easy to reach.  Do not use any sheets or blankets that are too big for your bed. They should not hang down onto the floor.  Have a firm chair that has side arms. You can use this for support while you get dressed.  Do not have throw rugs and other things on the floor that can make you trip. What can I do in the kitchen?  Clean up any spills right away.  Avoid walking on wet floors.  Keep items that you use a lot in easy-to-reach places.  If you need to reach something above you, use a strong step stool that has a grab bar.  Keep electrical cords out of the way.  Do not use floor polish or wax that  makes floors slippery. If you must use wax, use non-skid floor wax.  Do not have throw rugs and other things on the floor that can make you trip. What can I do with my stairs?  Do not leave any items on the stairs.  Make sure that there are handrails on both sides of the stairs and use them. Fix handrails that are broken or loose. Make sure that handrails are as long as the stairways.  Check any carpeting to make sure that it is firmly attached to the stairs. Fix any carpet that is loose or worn.  Avoid having throw rugs at the top or bottom of the stairs. If you do have throw rugs, attach them to the floor with carpet tape.  Make sure that you have a light switch at the top of the stairs and the bottom of the stairs. If you do not have them, ask someone to add them for you. What else can I do to help prevent falls?  Wear shoes that:  Do not have high heels.  Have rubber bottoms.  Are comfortable and fit you  well.  Are closed at the toe. Do not wear sandals.  If you use a stepladder:  Make sure that it is fully opened. Do not climb a closed stepladder.  Make sure that both sides of the stepladder are locked into place.  Ask someone to hold it for you, if possible.  Clearly mark and make sure that you can see:  Any grab bars or handrails.  First and last steps.  Where the edge of each step is.  Use tools that help you move around (mobility aids) if they are needed. These include:  Canes.  Walkers.  Scooters.  Crutches.  Turn on the lights when you go into a dark area. Replace any light bulbs as soon as they burn out.  Set up your furniture so you have a clear path. Avoid moving your furniture around.  If any of your floors are uneven, fix them.  If there are any pets around you, be aware of where they are.  Review your medicines with your doctor. Some medicines can make you feel dizzy. This can increase your chance of falling. Ask your doctor what other things that you can do to help prevent falls. This information is not intended to replace advice given to you by your health care provider. Make sure you discuss any questions you have with your health care provider. Document Released: 08/30/2009 Document Revised: 04/10/2016 Document Reviewed: 12/08/2014 Elsevier Interactive Patient Education  2017 [employer].

## 2017-10-06 NOTE — Progress Notes (Signed)
Subjective:   Corey Daniel is a 71 y.o. male who presents for Medicare Annual/Subsequent preventive examination.  Review of Systems:   Cardiac Risk Factors include: advanced age (>38men, >35 women);hypertension;obesity (BMI >30kg/m2);family history of premature cardiovascular disease;dyslipidemia;male gender     Objective:    Vitals: BP 130/82 (BP Location: Left Arm, Patient Position: Sitting)   Pulse 78   Temp 98.5 F (36.9 C) (Oral)   Resp 16   Ht 5\' 10"  (1.778 m)   Wt 218 lb 6.4 oz (99.1 kg)   BMI 31.34 kg/m   Body mass index is 31.34 kg/m.  Tobacco Social History   Tobacco Use  Smoking Status Former Smoker  . Packs/day: 0.50  . Years: 30.00  . Pack years: 15.00  . Types: Cigarettes  . Last attempt to quit: 1983  . Years since quitting: 35.9  Smokeless Tobacco Never Used     Counseling given: Not Answered   Past Medical History:  Diagnosis Date  . Arthritis   . ASCVD (arteriosclerotic cardiovascular disease)   . Back pain    with radiation  . Cancer ([place])    lymphoma  . Coronary artery disease   . Hyperlipidemia   . Hypertension   . IDA (iron deficiency anemia)   . Lower leg pain   . Myocardial infarction [facility])    Past Surgical History:  Procedure Laterality Date  . AUTOGRAFT STRUCTURAL ICBG OBTAINED SEPARATE INCISION FOR SPINE SURGERY   Bilateral 07/15/2013  . BACK SURGERY    . CATARACT EXTRACTION  2010  . COLONOSCOPY  06/2017  . CORONARY ARTERY BYPASS GRAFT    . IR FLUORO GUIDE PORT INSERTION LEFT  07/31/2017  . LAMINECTOMY LUMBAR EXCISON/EVACUATION EXTRADURAL INTRASPINAL LESION Bilateral 07/15/2013     . LAMINOTOMY REEXPLORATION POSTERIOR LUMBAR W/NERVE DECOMP & DISCECTOMY Bilateral 07/15/2013     . UPPER GI ENDOSCOPY  06/2017   Family History  Problem Relation Age of Onset  . Heart disease Mother   . Coarctation of the aorta Brother    Social History   Substance and Sexual Activity  Sexual Activity Yes    Outpatient Encounter  Medications as of 10/06/2017  Medication Sig  . carvedilol (COREG) 3.125 MG tablet Take 1 tablet (3.125 mg total) by mouth 2 (two) times daily with a meal.  . Cholecalciferol (VITAMIN D3) 2000 UNITS capsule Take 2,000 Units by mouth daily.   . Coenzyme Q10 100 MG capsule Take by mouth.  . ezetimibe (ZETIA) 10 MG tablet Take 1 tablet (10 mg total) by mouth at bedtime.  . Fish Oil-Cholecalciferol (FISH OIL + D3) 1000-1000 MG-UNIT CAPS Take by mouth.  . lidocaine-prilocaine (EMLA) cream Apply 1 application topically as needed. Apply generously over the Mediport 45 minutes prior to chemotherapy.  Marland Kitchen lisinopril (ZESTRIL) 2.5 MG tablet Take 1 tablet (2.5 mg total) by mouth daily.  Marland Kitchen omeprazole (PRILOSEC) 40 MG capsule daily as needed.   . rosuvastatin (CRESTOR) 5 MG tablet Take 1 tablet (5 mg total) by mouth 3 (three) times a week. 3 times weekly  . aspirin 81 MG chewable tablet Chew 81 mg by mouth daily.   . fluticasone (FLONASE) 50 MCG/ACT nasal spray Place into the nose.  . ondansetron (ZOFRAN) 8 MG tablet Take 1 tablet (8 mg total) by mouth every 8 (eight) hours as needed for nausea or vomiting (start 3 days; after chemo). (Patient not taking: Reported on 09/01/2017)  . predniSONE (DELTASONE) 50 MG tablet Take 2 tablets once day x 5 days.  START on day of your chemotherapy. Take with food. (Patient not taking: Reported on 09/22/2017)  . prochlorperazine (COMPAZINE) 10 MG tablet Take 1 tablet (10 mg total) by mouth every 6 (six) hours as needed for nausea or vomiting. (Patient not taking: Reported on 09/01/2017)   No facility-administered encounter medications on file as of 10/06/2017.     Activities of Daily Living In your present state of health, do you have any difficulty performing the following activities: 10/06/2017 04/21/2017  Hearing? Y N  Comment has hearing aids, doesnt wear them  -  Vision? N N  Difficulty concentrating or making decisions? Y N  Walking or climbing stairs? N N  Dressing  or bathing? N N  Doing errands, shopping? N N  Preparing Food and eating ? N -  Using the Toilet? N -  In the past six months, have you accidently leaked urine? N -  Do you have problems with loss of bowel control? N -  Managing your Medications? N -  Managing your Finances? N -  Housekeeping or managing your Housekeeping? N -  Some recent data might be hidden    Patient Care Team: Olin Hauser, DO as PCP - General (Family Medicine)   Assessment:     Exercise Activities and Dietary recommendations Current Exercise Habits: The patient does not participate in regular exercise at present, Exercise limited by: Other - see comments(currently on chemotherapy )  Goals    . DIET - INCREASE WATER INTAKE     Recommend drinking at least 7-8 glasses of water a day       Fall Risk Fall Risk  10/06/2017 08/07/2016 02/05/2016 08/08/2015  Falls in the past year? No No No No   Depression Screen PHQ 2/9 Scores 10/06/2017 08/07/2016 02/05/2016 08/08/2015  PHQ - 2 Score 0 0 0 0    Cognitive Function     6CIT Screen 10/06/2017  What Year? 0 points  What month? 0 points  What time? 0 points  Count back from 20 0 points  Months in reverse 0 points  Repeat phrase 0 points  Total Score 0    Immunization History  Administered Date(s) Administered  . Hepatitis B 03/17/2004  . Influenza, High Dose Seasonal PF 08/08/2015, 08/07/2016, 07/22/2017  . Influenza-Unspecified 08/29/2013, 09/28/2014, 08/08/2015, 08/07/2016  . Pneumococcal Conjugate-13 09/28/2014  . Pneumococcal Polysaccharide-23 08/07/2016  . Tdap 04/17/2014   Screening Tests Health Maintenance  Topic Date Due  . COLONOSCOPY  06/23/2020  . TETANUS/TDAP  04/17/2024  . INFLUENZA VACCINE  Completed  . Hepatitis C Screening  Completed  . PNA vac Low Risk Adult  Completed      Plan:    I have personally reviewed and addressed the Medicare Annual Wellness questionnaire and have noted the following in the patient's  chart:  A. Medical and social history B. Use of alcohol, tobacco or illicit drugs  C. Current medications and supplements D. Functional ability and status E.  Nutritional status F.  Physical activity G. Advance directives H. List of other physicians I.  Hospitalizations, surgeries, and ER visits in previous 12 months J.  [place] such as hearing and vision if needed, cognitive and depression L. Referrals and appointments   In addition, I have reviewed and discussed with patient certain preventive protocols, quality metrics, and best practice recommendations. A written personalized care plan for preventive services as well as general preventive health recommendations were provided to patient.   Signed,  Tyler Aas, LPN Nurse Health  Advisor   Nurse Notes:none

## 2017-10-07 ENCOUNTER — Other Ambulatory Visit: Payer: Self-pay | Admitting: *Deleted

## 2017-10-07 DIAGNOSIS — E785 Hyperlipidemia, unspecified: Secondary | ICD-10-CM | POA: Diagnosis not present

## 2017-10-07 DIAGNOSIS — I252 Old myocardial infarction: Secondary | ICD-10-CM | POA: Diagnosis not present

## 2017-10-07 DIAGNOSIS — I10 Essential (primary) hypertension: Secondary | ICD-10-CM | POA: Diagnosis not present

## 2017-10-07 DIAGNOSIS — D509 Iron deficiency anemia, unspecified: Secondary | ICD-10-CM | POA: Diagnosis not present

## 2017-10-07 DIAGNOSIS — Z510 Encounter for antineoplastic radiation therapy: Secondary | ICD-10-CM | POA: Diagnosis not present

## 2017-10-07 DIAGNOSIS — C8333 Diffuse large B-cell lymphoma, intra-abdominal lymph nodes: Secondary | ICD-10-CM

## 2017-10-07 DIAGNOSIS — M549 Dorsalgia, unspecified: Secondary | ICD-10-CM | POA: Diagnosis not present

## 2017-10-07 DIAGNOSIS — M129 Arthropathy, unspecified: Secondary | ICD-10-CM | POA: Diagnosis not present

## 2017-10-07 DIAGNOSIS — Z951 Presence of aortocoronary bypass graft: Secondary | ICD-10-CM | POA: Diagnosis not present

## 2017-10-07 DIAGNOSIS — I2510 Atherosclerotic heart disease of native coronary artery without angina pectoris: Secondary | ICD-10-CM | POA: Diagnosis not present

## 2017-10-07 DIAGNOSIS — B9681 Helicobacter pylori [H. pylori] as the cause of diseases classified elsewhere: Secondary | ICD-10-CM | POA: Diagnosis not present

## 2017-10-07 DIAGNOSIS — Z79899 Other long term (current) drug therapy: Secondary | ICD-10-CM | POA: Diagnosis not present

## 2017-10-07 DIAGNOSIS — Z87891 Personal history of nicotine dependence: Secondary | ICD-10-CM | POA: Diagnosis not present

## 2017-10-07 DIAGNOSIS — Z9221 Personal history of antineoplastic chemotherapy: Secondary | ICD-10-CM | POA: Diagnosis not present

## 2017-10-07 DIAGNOSIS — Z7982 Long term (current) use of aspirin: Secondary | ICD-10-CM | POA: Diagnosis not present

## 2017-10-13 ENCOUNTER — Telehealth: Payer: Self-pay | Admitting: Family Medicine

## 2017-10-13 NOTE — Telephone Encounter (Signed)
Corey Daniel with [employer] said pt was diagnosed with Cancer and needs information regarding claim #[id number].  Her call back number is [phone]

## 2017-10-14 ENCOUNTER — Ambulatory Visit
Admission: RE | Admit: 2017-10-14 | Discharge: 2017-10-14 | Disposition: A | Discharge status: Home / Self Care | Payer: Medicare Other | Source: Ambulatory Visit | Attending: Radiation Oncology | Admitting: Radiation Oncology

## 2017-10-14 DIAGNOSIS — B9681 Helicobacter pylori [H. pylori] as the cause of diseases classified elsewhere: Secondary | ICD-10-CM | POA: Diagnosis not present

## 2017-10-14 DIAGNOSIS — Z510 Encounter for antineoplastic radiation therapy: Secondary | ICD-10-CM | POA: Diagnosis not present

## 2017-10-14 DIAGNOSIS — M549 Dorsalgia, unspecified: Secondary | ICD-10-CM | POA: Diagnosis not present

## 2017-10-14 DIAGNOSIS — M129 Arthropathy, unspecified: Secondary | ICD-10-CM | POA: Diagnosis not present

## 2017-10-14 DIAGNOSIS — I2510 Atherosclerotic heart disease of native coronary artery without angina pectoris: Secondary | ICD-10-CM | POA: Diagnosis not present

## 2017-10-14 DIAGNOSIS — C8333 Diffuse large B-cell lymphoma, intra-abdominal lymph nodes: Secondary | ICD-10-CM | POA: Diagnosis not present

## 2017-10-15 ENCOUNTER — Ambulatory Visit
Admission: RE | Admit: 2017-10-15 | Discharge: 2017-10-15 | Disposition: A | Discharge status: Home / Self Care | Payer: Medicare Other | Source: Ambulatory Visit | Attending: Radiation Oncology | Admitting: Radiation Oncology

## 2017-10-15 DIAGNOSIS — I2510 Atherosclerotic heart disease of native coronary artery without angina pectoris: Secondary | ICD-10-CM | POA: Diagnosis not present

## 2017-10-15 DIAGNOSIS — M129 Arthropathy, unspecified: Secondary | ICD-10-CM | POA: Diagnosis not present

## 2017-10-15 DIAGNOSIS — B9681 Helicobacter pylori [H. pylori] as the cause of diseases classified elsewhere: Secondary | ICD-10-CM | POA: Diagnosis not present

## 2017-10-15 DIAGNOSIS — C8333 Diffuse large B-cell lymphoma, intra-abdominal lymph nodes: Secondary | ICD-10-CM | POA: Diagnosis not present

## 2017-10-15 DIAGNOSIS — M549 Dorsalgia, unspecified: Secondary | ICD-10-CM | POA: Diagnosis not present

## 2017-10-15 DIAGNOSIS — Z510 Encounter for antineoplastic radiation therapy: Secondary | ICD-10-CM | POA: Diagnosis not present

## 2017-10-16 ENCOUNTER — Ambulatory Visit
Admission: RE | Admit: 2017-10-16 | Discharge: 2017-10-16 | Disposition: A | Discharge status: Home / Self Care | Payer: Medicare Other | Source: Ambulatory Visit | Attending: Radiation Oncology | Admitting: Radiation Oncology

## 2017-10-16 DIAGNOSIS — I2510 Atherosclerotic heart disease of native coronary artery without angina pectoris: Secondary | ICD-10-CM | POA: Diagnosis not present

## 2017-10-16 DIAGNOSIS — B9681 Helicobacter pylori [H. pylori] as the cause of diseases classified elsewhere: Secondary | ICD-10-CM | POA: Diagnosis not present

## 2017-10-16 DIAGNOSIS — C8333 Diffuse large B-cell lymphoma, intra-abdominal lymph nodes: Secondary | ICD-10-CM | POA: Diagnosis not present

## 2017-10-16 DIAGNOSIS — M549 Dorsalgia, unspecified: Secondary | ICD-10-CM | POA: Diagnosis not present

## 2017-10-16 DIAGNOSIS — Z510 Encounter for antineoplastic radiation therapy: Secondary | ICD-10-CM | POA: Diagnosis not present

## 2017-10-16 DIAGNOSIS — M129 Arthropathy, unspecified: Secondary | ICD-10-CM | POA: Diagnosis not present

## 2017-10-19 ENCOUNTER — Ambulatory Visit
Admission: RE | Admit: 2017-10-19 | Discharge: 2017-10-19 | Disposition: A | Discharge status: Home / Self Care | Payer: Medicare Other | Source: Ambulatory Visit | Attending: Radiation Oncology | Admitting: Radiation Oncology

## 2017-10-19 DIAGNOSIS — Z510 Encounter for antineoplastic radiation therapy: Secondary | ICD-10-CM | POA: Diagnosis not present

## 2017-10-19 DIAGNOSIS — C8333 Diffuse large B-cell lymphoma, intra-abdominal lymph nodes: Secondary | ICD-10-CM | POA: Diagnosis not present

## 2017-10-19 DIAGNOSIS — M549 Dorsalgia, unspecified: Secondary | ICD-10-CM | POA: Diagnosis not present

## 2017-10-19 DIAGNOSIS — B9681 Helicobacter pylori [H. pylori] as the cause of diseases classified elsewhere: Secondary | ICD-10-CM | POA: Diagnosis not present

## 2017-10-19 DIAGNOSIS — M129 Arthropathy, unspecified: Secondary | ICD-10-CM | POA: Diagnosis not present

## 2017-10-19 DIAGNOSIS — I2510 Atherosclerotic heart disease of native coronary artery without angina pectoris: Secondary | ICD-10-CM | POA: Diagnosis not present

## 2017-10-20 ENCOUNTER — Ambulatory Visit
Admission: RE | Admit: 2017-10-20 | Discharge: 2017-10-20 | Disposition: A | Discharge status: Home / Self Care | Payer: Medicare Other | Source: Ambulatory Visit | Attending: Radiation Oncology | Admitting: Radiation Oncology

## 2017-10-20 DIAGNOSIS — B9681 Helicobacter pylori [H. pylori] as the cause of diseases classified elsewhere: Secondary | ICD-10-CM | POA: Diagnosis not present

## 2017-10-20 DIAGNOSIS — M129 Arthropathy, unspecified: Secondary | ICD-10-CM | POA: Diagnosis not present

## 2017-10-20 DIAGNOSIS — I2510 Atherosclerotic heart disease of native coronary artery without angina pectoris: Secondary | ICD-10-CM | POA: Diagnosis not present

## 2017-10-20 DIAGNOSIS — C8333 Diffuse large B-cell lymphoma, intra-abdominal lymph nodes: Secondary | ICD-10-CM | POA: Diagnosis not present

## 2017-10-20 DIAGNOSIS — Z510 Encounter for antineoplastic radiation therapy: Secondary | ICD-10-CM | POA: Diagnosis not present

## 2017-10-20 DIAGNOSIS — M549 Dorsalgia, unspecified: Secondary | ICD-10-CM | POA: Diagnosis not present

## 2017-10-20 NOTE — Telephone Encounter (Signed)
Advised [employer] about claim #.

## 2017-10-21 ENCOUNTER — Ambulatory Visit: Payer: Medicare Other | Admitting: Family Medicine

## 2017-10-21 ENCOUNTER — Ambulatory Visit
Admission: RE | Admit: 2017-10-21 | Discharge: 2017-10-21 | Disposition: A | Discharge status: Home / Self Care | Payer: Medicare Other | Source: Ambulatory Visit | Attending: Radiation Oncology | Admitting: Radiation Oncology

## 2017-10-21 DIAGNOSIS — B9681 Helicobacter pylori [H. pylori] as the cause of diseases classified elsewhere: Secondary | ICD-10-CM | POA: Diagnosis not present

## 2017-10-21 DIAGNOSIS — C8333 Diffuse large B-cell lymphoma, intra-abdominal lymph nodes: Secondary | ICD-10-CM | POA: Diagnosis not present

## 2017-10-21 DIAGNOSIS — M129 Arthropathy, unspecified: Secondary | ICD-10-CM | POA: Diagnosis not present

## 2017-10-21 DIAGNOSIS — I2510 Atherosclerotic heart disease of native coronary artery without angina pectoris: Secondary | ICD-10-CM | POA: Diagnosis not present

## 2017-10-21 DIAGNOSIS — Z510 Encounter for antineoplastic radiation therapy: Secondary | ICD-10-CM | POA: Diagnosis not present

## 2017-10-21 DIAGNOSIS — M549 Dorsalgia, unspecified: Secondary | ICD-10-CM | POA: Diagnosis not present

## 2017-10-22 ENCOUNTER — Ambulatory Visit
Admission: RE | Admit: 2017-10-22 | Discharge: 2017-10-22 | Disposition: A | Discharge status: Home / Self Care | Payer: Medicare Other | Source: Ambulatory Visit | Attending: Radiation Oncology | Admitting: Radiation Oncology

## 2017-10-22 DIAGNOSIS — M129 Arthropathy, unspecified: Secondary | ICD-10-CM | POA: Diagnosis not present

## 2017-10-22 DIAGNOSIS — Z510 Encounter for antineoplastic radiation therapy: Secondary | ICD-10-CM | POA: Diagnosis not present

## 2017-10-22 DIAGNOSIS — M549 Dorsalgia, unspecified: Secondary | ICD-10-CM | POA: Diagnosis not present

## 2017-10-22 DIAGNOSIS — C8333 Diffuse large B-cell lymphoma, intra-abdominal lymph nodes: Secondary | ICD-10-CM | POA: Diagnosis not present

## 2017-10-22 DIAGNOSIS — I2510 Atherosclerotic heart disease of native coronary artery without angina pectoris: Secondary | ICD-10-CM | POA: Diagnosis not present

## 2017-10-22 DIAGNOSIS — B9681 Helicobacter pylori [H. pylori] as the cause of diseases classified elsewhere: Secondary | ICD-10-CM | POA: Diagnosis not present

## 2017-10-23 ENCOUNTER — Ambulatory Visit
Admission: RE | Admit: 2017-10-23 | Discharge: 2017-10-23 | Disposition: A | Discharge status: Home / Self Care | Payer: Medicare Other | Source: Ambulatory Visit | Attending: Radiation Oncology | Admitting: Radiation Oncology

## 2017-10-23 DIAGNOSIS — I2510 Atherosclerotic heart disease of native coronary artery without angina pectoris: Secondary | ICD-10-CM | POA: Diagnosis not present

## 2017-10-23 DIAGNOSIS — C8333 Diffuse large B-cell lymphoma, intra-abdominal lymph nodes: Secondary | ICD-10-CM | POA: Diagnosis not present

## 2017-10-23 DIAGNOSIS — M129 Arthropathy, unspecified: Secondary | ICD-10-CM | POA: Diagnosis not present

## 2017-10-23 DIAGNOSIS — B9681 Helicobacter pylori [H. pylori] as the cause of diseases classified elsewhere: Secondary | ICD-10-CM | POA: Diagnosis not present

## 2017-10-23 DIAGNOSIS — Z510 Encounter for antineoplastic radiation therapy: Secondary | ICD-10-CM | POA: Diagnosis not present

## 2017-10-23 DIAGNOSIS — M549 Dorsalgia, unspecified: Secondary | ICD-10-CM | POA: Diagnosis not present

## 2017-10-26 ENCOUNTER — Ambulatory Visit: Payer: Medicare Other

## 2017-10-27 ENCOUNTER — Inpatient Hospital Stay: Payer: Medicare Other | Attending: Internal Medicine

## 2017-10-27 ENCOUNTER — Ambulatory Visit
Admission: RE | Admit: 2017-10-27 | Discharge: 2017-10-27 | Disposition: A | Discharge status: Home / Self Care | Payer: Medicare Other | Source: Ambulatory Visit | Attending: Radiation Oncology | Admitting: Radiation Oncology

## 2017-10-27 DIAGNOSIS — Z8619 Personal history of other infectious and parasitic diseases: Secondary | ICD-10-CM | POA: Diagnosis not present

## 2017-10-27 DIAGNOSIS — I10 Essential (primary) hypertension: Secondary | ICD-10-CM | POA: Diagnosis not present

## 2017-10-27 DIAGNOSIS — B9681 Helicobacter pylori [H. pylori] as the cause of diseases classified elsewhere: Secondary | ICD-10-CM | POA: Diagnosis not present

## 2017-10-27 DIAGNOSIS — C8339 Diffuse large B-cell lymphoma, extranodal and solid organ sites: Secondary | ICD-10-CM | POA: Insufficient documentation

## 2017-10-27 DIAGNOSIS — M129 Arthropathy, unspecified: Secondary | ICD-10-CM | POA: Insufficient documentation

## 2017-10-27 DIAGNOSIS — M549 Dorsalgia, unspecified: Secondary | ICD-10-CM | POA: Insufficient documentation

## 2017-10-27 DIAGNOSIS — Z87891 Personal history of nicotine dependence: Secondary | ICD-10-CM | POA: Insufficient documentation

## 2017-10-27 DIAGNOSIS — C8333 Diffuse large B-cell lymphoma, intra-abdominal lymph nodes: Secondary | ICD-10-CM | POA: Diagnosis not present

## 2017-10-27 DIAGNOSIS — I252 Old myocardial infarction: Secondary | ICD-10-CM | POA: Diagnosis not present

## 2017-10-27 DIAGNOSIS — Z7982 Long term (current) use of aspirin: Secondary | ICD-10-CM | POA: Insufficient documentation

## 2017-10-27 DIAGNOSIS — I700 Atherosclerosis of aorta: Secondary | ICD-10-CM | POA: Insufficient documentation

## 2017-10-27 DIAGNOSIS — R918 Other nonspecific abnormal finding of lung field: Secondary | ICD-10-CM | POA: Diagnosis not present

## 2017-10-27 DIAGNOSIS — R5383 Other fatigue: Secondary | ICD-10-CM | POA: Diagnosis not present

## 2017-10-27 DIAGNOSIS — Z510 Encounter for antineoplastic radiation therapy: Secondary | ICD-10-CM | POA: Diagnosis not present

## 2017-10-27 DIAGNOSIS — E785 Hyperlipidemia, unspecified: Secondary | ICD-10-CM | POA: Diagnosis not present

## 2017-10-27 DIAGNOSIS — Z923 Personal history of irradiation: Secondary | ICD-10-CM | POA: Insufficient documentation

## 2017-10-27 DIAGNOSIS — Z9221 Personal history of antineoplastic chemotherapy: Secondary | ICD-10-CM | POA: Insufficient documentation

## 2017-10-27 DIAGNOSIS — Z79899 Other long term (current) drug therapy: Secondary | ICD-10-CM | POA: Insufficient documentation

## 2017-10-27 DIAGNOSIS — K760 Fatty (change of) liver, not elsewhere classified: Secondary | ICD-10-CM | POA: Diagnosis not present

## 2017-10-27 DIAGNOSIS — D509 Iron deficiency anemia, unspecified: Secondary | ICD-10-CM | POA: Insufficient documentation

## 2017-10-27 DIAGNOSIS — I2510 Atherosclerotic heart disease of native coronary artery without angina pectoris: Secondary | ICD-10-CM | POA: Diagnosis not present

## 2017-10-27 LAB — CBC
HCT: 37 % — ABNORMAL LOW (ref 40.0–52.0)
Hemoglobin: 12.7 g/dL — ABNORMAL LOW (ref 13.0–18.0)
MCH: 31.3 pg (ref 26.0–34.0)
MCHC: 34.3 g/dL (ref 32.0–36.0)
MCV: 91.2 fL (ref 80.0–100.0)
PLATELETS: 164 10*3/uL (ref 150–440)
RBC: 4.05 MIL/uL — ABNORMAL LOW (ref 4.40–5.90)
RDW: 16.6 % — AB (ref 11.5–14.5)
WBC: 6.1 10*3/uL (ref 3.8–10.6)

## 2017-10-28 ENCOUNTER — Ambulatory Visit
Admission: RE | Admit: 2017-10-28 | Discharge: 2017-10-28 | Disposition: A | Discharge status: Home / Self Care | Payer: Medicare Other | Source: Ambulatory Visit | Attending: Radiation Oncology | Admitting: Radiation Oncology

## 2017-10-28 DIAGNOSIS — M129 Arthropathy, unspecified: Secondary | ICD-10-CM | POA: Diagnosis not present

## 2017-10-28 DIAGNOSIS — B9681 Helicobacter pylori [H. pylori] as the cause of diseases classified elsewhere: Secondary | ICD-10-CM | POA: Diagnosis not present

## 2017-10-28 DIAGNOSIS — Z510 Encounter for antineoplastic radiation therapy: Secondary | ICD-10-CM | POA: Diagnosis not present

## 2017-10-28 DIAGNOSIS — C8333 Diffuse large B-cell lymphoma, intra-abdominal lymph nodes: Secondary | ICD-10-CM | POA: Diagnosis not present

## 2017-10-28 DIAGNOSIS — M549 Dorsalgia, unspecified: Secondary | ICD-10-CM | POA: Diagnosis not present

## 2017-10-28 DIAGNOSIS — I2510 Atherosclerotic heart disease of native coronary artery without angina pectoris: Secondary | ICD-10-CM | POA: Diagnosis not present

## 2017-10-29 ENCOUNTER — Ambulatory Visit
Admission: RE | Admit: 2017-10-29 | Discharge: 2017-10-29 | Disposition: A | Discharge status: Home / Self Care | Payer: Medicare Other | Source: Ambulatory Visit | Attending: Radiation Oncology | Admitting: Radiation Oncology

## 2017-10-29 DIAGNOSIS — I2510 Atherosclerotic heart disease of native coronary artery without angina pectoris: Secondary | ICD-10-CM | POA: Diagnosis not present

## 2017-10-29 DIAGNOSIS — M129 Arthropathy, unspecified: Secondary | ICD-10-CM | POA: Diagnosis not present

## 2017-10-29 DIAGNOSIS — M549 Dorsalgia, unspecified: Secondary | ICD-10-CM | POA: Diagnosis not present

## 2017-10-29 DIAGNOSIS — C8333 Diffuse large B-cell lymphoma, intra-abdominal lymph nodes: Secondary | ICD-10-CM | POA: Diagnosis not present

## 2017-10-29 DIAGNOSIS — B9681 Helicobacter pylori [H. pylori] as the cause of diseases classified elsewhere: Secondary | ICD-10-CM | POA: Diagnosis not present

## 2017-10-29 DIAGNOSIS — Z510 Encounter for antineoplastic radiation therapy: Secondary | ICD-10-CM | POA: Diagnosis not present

## 2017-10-30 ENCOUNTER — Ambulatory Visit
Admission: RE | Admit: 2017-10-30 | Discharge: 2017-10-30 | Disposition: A | Discharge status: Home / Self Care | Payer: Medicare Other | Source: Ambulatory Visit | Attending: Radiation Oncology | Admitting: Radiation Oncology

## 2017-10-30 DIAGNOSIS — Z510 Encounter for antineoplastic radiation therapy: Secondary | ICD-10-CM | POA: Diagnosis not present

## 2017-10-30 DIAGNOSIS — I2510 Atherosclerotic heart disease of native coronary artery without angina pectoris: Secondary | ICD-10-CM | POA: Diagnosis not present

## 2017-10-30 DIAGNOSIS — M549 Dorsalgia, unspecified: Secondary | ICD-10-CM | POA: Diagnosis not present

## 2017-10-30 DIAGNOSIS — C8333 Diffuse large B-cell lymphoma, intra-abdominal lymph nodes: Secondary | ICD-10-CM | POA: Diagnosis not present

## 2017-10-30 DIAGNOSIS — B9681 Helicobacter pylori [H. pylori] as the cause of diseases classified elsewhere: Secondary | ICD-10-CM | POA: Diagnosis not present

## 2017-10-30 DIAGNOSIS — M129 Arthropathy, unspecified: Secondary | ICD-10-CM | POA: Diagnosis not present

## 2017-11-02 ENCOUNTER — Ambulatory Visit
Admission: RE | Admit: 2017-11-02 | Discharge: 2017-11-02 | Disposition: A | Discharge status: Home / Self Care | Payer: Medicare Other | Source: Ambulatory Visit | Attending: Radiation Oncology | Admitting: Radiation Oncology

## 2017-11-02 DIAGNOSIS — M129 Arthropathy, unspecified: Secondary | ICD-10-CM | POA: Diagnosis not present

## 2017-11-02 DIAGNOSIS — M549 Dorsalgia, unspecified: Secondary | ICD-10-CM | POA: Diagnosis not present

## 2017-11-02 DIAGNOSIS — Z510 Encounter for antineoplastic radiation therapy: Secondary | ICD-10-CM | POA: Diagnosis not present

## 2017-11-02 DIAGNOSIS — B9681 Helicobacter pylori [H. pylori] as the cause of diseases classified elsewhere: Secondary | ICD-10-CM | POA: Diagnosis not present

## 2017-11-02 DIAGNOSIS — I2510 Atherosclerotic heart disease of native coronary artery without angina pectoris: Secondary | ICD-10-CM | POA: Diagnosis not present

## 2017-11-02 DIAGNOSIS — C8333 Diffuse large B-cell lymphoma, intra-abdominal lymph nodes: Secondary | ICD-10-CM | POA: Diagnosis not present

## 2017-11-03 ENCOUNTER — Inpatient Hospital Stay: Payer: Medicare Other

## 2017-11-03 ENCOUNTER — Other Ambulatory Visit: Payer: Self-pay | Admitting: *Deleted

## 2017-11-03 ENCOUNTER — Inpatient Hospital Stay (HOSPITAL_BASED_OUTPATIENT_CLINIC_OR_DEPARTMENT_OTHER): Payer: Medicare Other | Admitting: Internal Medicine

## 2017-11-03 ENCOUNTER — Encounter: Payer: Self-pay | Admitting: Internal Medicine

## 2017-11-03 ENCOUNTER — Telehealth: Payer: Self-pay | Admitting: Family Medicine

## 2017-11-03 ENCOUNTER — Other Ambulatory Visit: Payer: Medicare Other

## 2017-11-03 ENCOUNTER — Encounter: Payer: Self-pay | Admitting: Family Medicine

## 2017-11-03 ENCOUNTER — Ambulatory Visit
Admission: RE | Admit: 2017-11-03 | Discharge: 2017-11-03 | Disposition: A | Discharge status: Home / Self Care | Payer: Medicare Other | Source: Ambulatory Visit | Attending: Radiation Oncology | Admitting: Radiation Oncology

## 2017-11-03 ENCOUNTER — Ambulatory Visit: Payer: Medicare Other | Admitting: Internal Medicine

## 2017-11-03 VITALS — BP 135/79 | HR 74 | Resp 16 | Wt 215.7 lb

## 2017-11-03 DIAGNOSIS — I2510 Atherosclerotic heart disease of native coronary artery without angina pectoris: Secondary | ICD-10-CM | POA: Diagnosis not present

## 2017-11-03 DIAGNOSIS — I252 Old myocardial infarction: Secondary | ICD-10-CM | POA: Diagnosis not present

## 2017-11-03 DIAGNOSIS — R5383 Other fatigue: Secondary | ICD-10-CM

## 2017-11-03 DIAGNOSIS — M549 Dorsalgia, unspecified: Secondary | ICD-10-CM

## 2017-11-03 DIAGNOSIS — C8339 Diffuse large B-cell lymphoma, extranodal and solid organ sites: Secondary | ICD-10-CM

## 2017-11-03 DIAGNOSIS — C8599 Non-Hodgkin lymphoma, unspecified, extranodal and solid organ sites: Secondary | ICD-10-CM

## 2017-11-03 DIAGNOSIS — Z923 Personal history of irradiation: Secondary | ICD-10-CM

## 2017-11-03 DIAGNOSIS — Z87891 Personal history of nicotine dependence: Secondary | ICD-10-CM

## 2017-11-03 DIAGNOSIS — I700 Atherosclerosis of aorta: Secondary | ICD-10-CM | POA: Diagnosis not present

## 2017-11-03 DIAGNOSIS — C8333 Diffuse large B-cell lymphoma, intra-abdominal lymph nodes: Secondary | ICD-10-CM | POA: Diagnosis not present

## 2017-11-03 DIAGNOSIS — R918 Other nonspecific abnormal finding of lung field: Secondary | ICD-10-CM | POA: Diagnosis not present

## 2017-11-03 DIAGNOSIS — I10 Essential (primary) hypertension: Secondary | ICD-10-CM | POA: Diagnosis not present

## 2017-11-03 DIAGNOSIS — Z9221 Personal history of antineoplastic chemotherapy: Secondary | ICD-10-CM

## 2017-11-03 DIAGNOSIS — Z510 Encounter for antineoplastic radiation therapy: Secondary | ICD-10-CM | POA: Diagnosis not present

## 2017-11-03 DIAGNOSIS — K760 Fatty (change of) liver, not elsewhere classified: Secondary | ICD-10-CM

## 2017-11-03 DIAGNOSIS — D509 Iron deficiency anemia, unspecified: Secondary | ICD-10-CM

## 2017-11-03 DIAGNOSIS — M129 Arthropathy, unspecified: Secondary | ICD-10-CM

## 2017-11-03 DIAGNOSIS — B9681 Helicobacter pylori [H. pylori] as the cause of diseases classified elsewhere: Secondary | ICD-10-CM | POA: Diagnosis not present

## 2017-11-03 DIAGNOSIS — Z79899 Other long term (current) drug therapy: Secondary | ICD-10-CM

## 2017-11-03 DIAGNOSIS — E785 Hyperlipidemia, unspecified: Secondary | ICD-10-CM

## 2017-11-03 DIAGNOSIS — Z8619 Personal history of other infectious and parasitic diseases: Secondary | ICD-10-CM

## 2017-11-03 DIAGNOSIS — Z7982 Long term (current) use of aspirin: Secondary | ICD-10-CM

## 2017-11-03 LAB — COMPREHENSIVE METABOLIC PANEL
ALK PHOS: 58 U/L (ref 38–126)
ALT: 19 U/L (ref 17–63)
AST: 24 U/L (ref 15–41)
Albumin: 4.1 g/dL (ref 3.5–5.0)
Anion gap: 8 (ref 5–15)
BILIRUBIN TOTAL: 0.7 mg/dL (ref 0.3–1.2)
BUN: 13 mg/dL (ref 6–20)
CALCIUM: 8.7 mg/dL — AB (ref 8.9–10.3)
CO2: 25 mmol/L (ref 22–32)
CREATININE: 0.93 mg/dL (ref 0.61–1.24)
Chloride: 106 mmol/L (ref 101–111)
Glucose, Bld: 160 mg/dL — ABNORMAL HIGH (ref 65–99)
Potassium: 4 mmol/L (ref 3.5–5.1)
Sodium: 139 mmol/L (ref 135–145)
Total Protein: 7 g/dL (ref 6.5–8.1)

## 2017-11-03 NOTE — Assessment & Plan Note (Addendum)
#   Diffuse large B-cell lymphoma of the stomach;STAGE I E.  # s/p cycle # 3 of R CHOP chemotherapy- currently on IFRT consolidation- until Jan 3rd 2018. Tolerating well.  Discussed with the patient that since he is doing clinically well-and given early stage of disease he should have a good response from treatment.  He should potentially be cured of his disease.  # Mild- moderate fatigue- sec to RT- monitor for now.   # Mucositis- G-1; resolved.   # follow up in last week of Feb 2019' PET scan/port flush/labs; in 6 weeks-port flush [facility]  # 25 minutes face-to-face with the patient discussing the above plan of care; more than 50% of time spent on prognosis/ natural history; counseling and coordination. ]

## 2017-11-03 NOTE — Progress Notes (Signed)
Port flushed, no blood return noted after six flushes.  Patient had CBC done yesterday so only a CMP was drawn peripherally today.

## 2017-11-03 NOTE — Telephone Encounter (Signed)
Received insurance claim information request from [employer] per patient today, and requesting it be completed and letter written to be faxed today due to a new sudden deadline given to him that he states was unaware of. He has already received letter from GI Dr Benson Norway, and now needs one from PCP regarding dates of cancer diagnosis.  I have discussed this with him and reviewed medical record. Letter has been printed today and signed 12/18, it will be given to [facility] staff to be faxed to [facility] today.  Below is copy of letter:  I am the primary care provider (PCP) for patient, Corey Daniel. I have been providing care for him since my first office visit on 04/21/17, before that date he was followed by previous PCP Dr Larene Beach who also practiced at our office, but he has since retired in March 2018.  Upon further review of his medical record and all test results available to me. He was never diagnosed with any form of cancer prior to the date of 05/17/2016.  He is currently being treated for Lymphoma Cancer of Stomach, which was a new diagnosis, made in August 2018, and is followed by Oncologist Dr Rogue Bussing ([place]).  Corey Putnam, DO [facility] 11/03/2017, 1:23 PM

## 2017-11-03 NOTE — Progress Notes (Signed)
[place] NOTE  Patient Care Team: Olin Hauser, DO as PCP - General (Family Medicine)  CHIEF COMPLAINTS/PURPOSE OF CONSULTATION:  Diffuse large B-cell lymphoma  #  Oncology History   # AUG 2018- DIFFUSE LARGE B CELL LYMPHOMA STOMACH-STAGE IE [BMBx-NEG]Fundus ;CD-20 pos;variable- bcl6/mum-?? GCB vs. ABC subtype;  [EGD for IDA; Dr.Hung; [place]]- NEG for FISH for;myc; bcl-6; bcl-2;11:14; MALT [8q21];NEG for CD-10; ki-67-70%; SEP PET- uptake in stomach. Hepatitis panel-NEG; BMBx-NEG  # SEP 25th 2018- R-CHOP x3 ; RT [until jan 3rd 2019]  # AUG 2018- H.pylori positive/stomach body.   # IDA-resolved  # CAD [s/p CABG; Dr.fath];MUGA scan [sep 2018]- 56.5%     Lymphoma of fundus of stomach ([place])     HISTORY OF PRESENTING ILLNESS:  Corey Daniel 71 y.o.  male with above history of diffuse large B-cell lymphoma of the stomach; Stage IE is s/p R CHOP chemotherapy #3; currently undergoing radiation.  Patient tolerating radiation fairly well.  Complains of mild fatigue otherwise not significantly worse.   Denies any nausea vomiting.  Denies any abdominal pain.  Appetite is good. No weight loss.  He denies any significant tingling and numbess in the extemities.  Mouth sores have resolved.  ROS: A complete 10 point review of system is done which is negative except mentioned above in history of present illness  MEDICAL HISTORY:  Past Medical History:  Diagnosis Date  . Arthritis   . ASCVD (arteriosclerotic cardiovascular disease)   . Back pain    with radiation  . Cancer ([place])    lymphoma  . Coronary artery disease   . Hyperlipidemia   . Hypertension   . IDA (iron deficiency anemia)   . Lower leg pain   . Myocardial infarction [facility])     SURGICAL HISTORY: Past Surgical History:  Procedure Laterality Date  . AUTOGRAFT STRUCTURAL ICBG OBTAINED SEPARATE INCISION FOR SPINE SURGERY   Bilateral 07/15/2013  . BACK SURGERY    . CATARACT EXTRACTION  2010   . COLONOSCOPY  06/2017  . CORONARY ARTERY BYPASS GRAFT    . IR FLUORO GUIDE PORT INSERTION LEFT  07/31/2017  . LAMINECTOMY LUMBAR EXCISON/EVACUATION EXTRADURAL INTRASPINAL LESION Bilateral 07/15/2013     . LAMINOTOMY REEXPLORATION POSTERIOR LUMBAR W/NERVE DECOMP & DISCECTOMY Bilateral 07/15/2013     . UPPER GI ENDOSCOPY  06/2017    SOCIAL HISTORY: He used to be in the [employer] 30-pack-year history of smoking quit 30 years ago. No alcohol. Lives with his wife in [place].  Social History   Socioeconomic History  . Marital status: Married    Spouse name: Not on file  . Number of children: Not on file  . Years of education: Not on file  . Highest education level: Not on file  Social Needs  . Financial resource strain: Not hard at all  . Food insecurity - worry: Never true  . Food insecurity - inability: Never true  . Transportation needs - medical: No  . Transportation needs - non-medical: No  Occupational History  . Not on file  Tobacco Use  . Smoking status: Former Smoker    Packs/day: 0.50    Years: 30.00    Pack years: 15.00    Types: Cigarettes    Last attempt to quit: 1983    Years since quitting: 35.9  . Smokeless tobacco: Never Used  Substance and Sexual Activity  . Alcohol use: No  . Drug use: No  . Sexual activity: Yes  Other Topics Concern  .  Not on file  Social History Narrative  . Not on file    FAMILY HISTORY:Father with Hodgkin's lymphoma in the 55s. Family History  Problem Relation Age of Onset  . Heart disease Mother   . Coarctation of the aorta Brother     ALLERGIES:  has No Known Allergies.  MEDICATIONS:  Current Outpatient Medications  Medication Sig Dispense Refill  . aspirin 81 MG chewable tablet Chew 81 mg by mouth daily.     . carvedilol (COREG) 3.125 MG tablet Take 1 tablet (3.125 mg total) by mouth 2 (two) times daily with a meal. 180 tablet 3  . Cholecalciferol (VITAMIN D3) 2000 UNITS capsule Take 2,000 Units by mouth daily.     .  Coenzyme Q10 100 MG capsule Take by mouth.    . ezetimibe (ZETIA) 10 MG tablet Take 1 tablet (10 mg total) by mouth at bedtime. 90 tablet 3  . Fish Oil-Cholecalciferol (FISH OIL + D3) 1000-1000 MG-UNIT CAPS Take by mouth.    . fluticasone (FLONASE) 50 MCG/ACT nasal spray Place into the nose.    . lidocaine-prilocaine (EMLA) cream Apply 1 application topically as needed. Apply generously over the Mediport 45 minutes prior to chemotherapy. 30 g 0  . lisinopril (ZESTRIL) 2.5 MG tablet Take 1 tablet (2.5 mg total) by mouth daily. 90 tablet 3  . omeprazole (PRILOSEC) 40 MG capsule daily as needed.     . rosuvastatin (CRESTOR) 5 MG tablet Take 1 tablet (5 mg total) by mouth 3 (three) times a week. 3 times weekly 36 tablet 3  . ondansetron (ZOFRAN) 8 MG tablet Take 1 tablet (8 mg total) by mouth every 8 (eight) hours as needed for nausea or vomiting (start 3 days; after chemo). (Patient not taking: Reported on 09/01/2017) 40 tablet 1  . prochlorperazine (COMPAZINE) 10 MG tablet Take 1 tablet (10 mg total) by mouth every 6 (six) hours as needed for nausea or vomiting. (Patient not taking: Reported on 09/01/2017) 40 tablet 1   No current facility-administered medications for this visit.       Marland Kitchen  PHYSICAL EXAMINATION: ECOG PERFORMANCE STATUS: 0 - Asymptomatic  Vitals:   11/03/17 1035  BP: 135/79  Pulse: 74  Resp: 16   Filed Weights   11/03/17 1032 11/03/17 1035  Weight: 215 lb 11.5 oz (97.8 kg) 215 lb 11.5 oz (97.8 kg)    GENERAL: Well-nourished well-developed; Alert, no distress and comfortable.  He is alone. EYES: no pallor or icterus OROPHARYNX: no thrush or ulceration; good dentition  NECK: supple, no masses felt LYMPH:  no palpable lymphadenopathy in the cervical, axillary or inguinal regions LUNGS: clear to auscultation and  No wheeze or crackles HEART/CVS: regular rate & rhythm and no murmurs; No lower extremity edema ABDOMEN: abdomen soft, non-tender and normal bowel  sounds Musculoskeletal:no cyanosis of digits and no clubbing  PSYCH: alert & oriented x 3 with fluent speech NEURO: no focal motor/sensory deficits SKIN:  no rashes or significant lesions; positive for alopecia.  LABORATORY DATA:  I have reviewed the data as listed Lab Results  Component Value Date   WBC 6.1 10/27/2017   HGB 12.7 (L) 10/27/2017   HCT 37.0 (L) 10/27/2017   MCV 91.2 10/27/2017   PLT 164 10/27/2017   Recent Labs    08/11/17 0843  09/01/17 0824 09/11/17 1006 09/22/17 0908 11/03/17 0955  NA 137   < > 138 137 138 139  K 4.1   < > 3.9 4.0 4.1 4.0  CL 104   < >  105 102 106 106  CO2 26   < > _0 GLUCOSE 115*   < > 147* 136* 129* 160*  BUN 17   < > _1 CREATININE 0.87   < > 0.87 1.07 0.88 0.93  CALCIUM 8.8*   < > 8.8* 8.7* 8.9 8.7*  GFRNONAA >60   < > >60 >60 >60 >60  GFRAA >60   < > >60 >60 >60 >60  PROT 7.4  --  6.9  --   --  7.0  ALBUMIN 4.1  --  3.7  --   --  4.1  AST 25  --  23  --   --  24  ALT 23  --  23  --   --  19  ALKPHOS 65  --  66  --   --  58  BILITOT 0.6  --  0.6  --   --  0.7   < > = values in this interval not displayed.   IMPRESSION: 1. Isolated hypermetabolism in the gastric fundus, consistent with the clinical history of lymphoma. 2. [place] tonsil hypermetabolism is likely physiologic. Recommend attention on follow-up. 3. Bilateral nonspecific pulmonary nodules also warrant followup attention. 4.  Aortic Atherosclerosis (ICD10-I70.0). 5. Bilateral femoral head avascular necrosis. 6. Hepatic steatosis.   Electronically Signed   By: Abigail Miyamoto M.D.   On: 07/29/2017 14:19 RADIOGRAPHIC STUDIES: I have personally reviewed the radiological images as listed and agreed with the findings in the report. No results found.  ASSESSMENT & PLAN:   Lymphoma of fundus of stomach ([place]) # Diffuse large B-cell lymphoma of the stomach;STAGE I E.  # s/p cycle # 3 of R CHOP chemotherapy- currently on IFRT consolidation-  until Jan 3rd 2018. Tolerating well.  Discussed with the patient that since he is doing clinically well-and given early stage of disease he should have a good response from treatment.  He should potentially be cured of his disease.  # Mild- moderate fatigue- sec to RT- monitor for now.   # Mucositis- G-1; resolved.   # follow up in last week of Feb 2019' PET scan/port flush/labs; in 6 weeks-port flush [[place]  # 25 minutes face-to-face with the patient discussing the above plan of care; more than 50% of time spent on prognosis/ natural history; counseling and coordination. ]  All questions were answered. The patient knows to call the clinic with any problems, questions or concerns.    Cammie Sickle, MD 11/03/2017 11:15 AM

## 2017-11-04 ENCOUNTER — Ambulatory Visit
Admission: RE | Admit: 2017-11-04 | Discharge: 2017-11-04 | Disposition: A | Discharge status: Home / Self Care | Payer: Medicare Other | Source: Ambulatory Visit | Attending: Radiation Oncology | Admitting: Radiation Oncology

## 2017-11-04 DIAGNOSIS — Z510 Encounter for antineoplastic radiation therapy: Secondary | ICD-10-CM | POA: Diagnosis not present

## 2017-11-04 DIAGNOSIS — B9681 Helicobacter pylori [H. pylori] as the cause of diseases classified elsewhere: Secondary | ICD-10-CM | POA: Diagnosis not present

## 2017-11-04 DIAGNOSIS — C8333 Diffuse large B-cell lymphoma, intra-abdominal lymph nodes: Secondary | ICD-10-CM | POA: Diagnosis not present

## 2017-11-04 DIAGNOSIS — M129 Arthropathy, unspecified: Secondary | ICD-10-CM | POA: Diagnosis not present

## 2017-11-04 DIAGNOSIS — I2510 Atherosclerotic heart disease of native coronary artery without angina pectoris: Secondary | ICD-10-CM | POA: Diagnosis not present

## 2017-11-04 DIAGNOSIS — M549 Dorsalgia, unspecified: Secondary | ICD-10-CM | POA: Diagnosis not present

## 2017-11-05 ENCOUNTER — Ambulatory Visit
Admission: RE | Admit: 2017-11-05 | Discharge: 2017-11-05 | Disposition: A | Discharge status: Home / Self Care | Payer: Medicare Other | Source: Ambulatory Visit | Attending: Radiation Oncology | Admitting: Radiation Oncology

## 2017-11-05 DIAGNOSIS — M549 Dorsalgia, unspecified: Secondary | ICD-10-CM | POA: Diagnosis not present

## 2017-11-05 DIAGNOSIS — B9681 Helicobacter pylori [H. pylori] as the cause of diseases classified elsewhere: Secondary | ICD-10-CM | POA: Diagnosis not present

## 2017-11-05 DIAGNOSIS — M129 Arthropathy, unspecified: Secondary | ICD-10-CM | POA: Diagnosis not present

## 2017-11-05 DIAGNOSIS — Z510 Encounter for antineoplastic radiation therapy: Secondary | ICD-10-CM | POA: Diagnosis not present

## 2017-11-05 DIAGNOSIS — C8333 Diffuse large B-cell lymphoma, intra-abdominal lymph nodes: Secondary | ICD-10-CM | POA: Diagnosis not present

## 2017-11-05 DIAGNOSIS — I2510 Atherosclerotic heart disease of native coronary artery without angina pectoris: Secondary | ICD-10-CM | POA: Diagnosis not present

## 2017-11-06 ENCOUNTER — Ambulatory Visit
Admission: RE | Admit: 2017-11-06 | Discharge: 2017-11-06 | Disposition: A | Discharge status: Home / Self Care | Payer: Medicare Other | Source: Ambulatory Visit | Attending: Radiation Oncology | Admitting: Radiation Oncology

## 2017-11-06 DIAGNOSIS — M129 Arthropathy, unspecified: Secondary | ICD-10-CM | POA: Diagnosis not present

## 2017-11-06 DIAGNOSIS — I2510 Atherosclerotic heart disease of native coronary artery without angina pectoris: Secondary | ICD-10-CM | POA: Diagnosis not present

## 2017-11-06 DIAGNOSIS — C8333 Diffuse large B-cell lymphoma, intra-abdominal lymph nodes: Secondary | ICD-10-CM | POA: Diagnosis not present

## 2017-11-06 DIAGNOSIS — Z510 Encounter for antineoplastic radiation therapy: Secondary | ICD-10-CM | POA: Diagnosis not present

## 2017-11-06 DIAGNOSIS — B9681 Helicobacter pylori [H. pylori] as the cause of diseases classified elsewhere: Secondary | ICD-10-CM | POA: Diagnosis not present

## 2017-11-06 DIAGNOSIS — M549 Dorsalgia, unspecified: Secondary | ICD-10-CM | POA: Diagnosis not present

## 2017-11-09 ENCOUNTER — Ambulatory Visit
Admission: RE | Admit: 2017-11-09 | Discharge: 2017-11-09 | Disposition: A | Discharge status: Home / Self Care | Payer: Medicare Other | Source: Ambulatory Visit | Attending: Radiation Oncology | Admitting: Radiation Oncology

## 2017-11-09 DIAGNOSIS — M549 Dorsalgia, unspecified: Secondary | ICD-10-CM | POA: Diagnosis not present

## 2017-11-09 DIAGNOSIS — Z510 Encounter for antineoplastic radiation therapy: Secondary | ICD-10-CM | POA: Diagnosis not present

## 2017-11-09 DIAGNOSIS — B9681 Helicobacter pylori [H. pylori] as the cause of diseases classified elsewhere: Secondary | ICD-10-CM | POA: Diagnosis not present

## 2017-11-09 DIAGNOSIS — I2510 Atherosclerotic heart disease of native coronary artery without angina pectoris: Secondary | ICD-10-CM | POA: Diagnosis not present

## 2017-11-09 DIAGNOSIS — C8333 Diffuse large B-cell lymphoma, intra-abdominal lymph nodes: Secondary | ICD-10-CM | POA: Diagnosis not present

## 2017-11-09 DIAGNOSIS — M129 Arthropathy, unspecified: Secondary | ICD-10-CM | POA: Diagnosis not present

## 2017-11-11 ENCOUNTER — Ambulatory Visit
Admission: RE | Admit: 2017-11-11 | Discharge: 2017-11-11 | Disposition: A | Discharge status: Home / Self Care | Payer: Medicare Other | Source: Ambulatory Visit | Attending: Radiation Oncology | Admitting: Radiation Oncology

## 2017-11-11 DIAGNOSIS — B9681 Helicobacter pylori [H. pylori] as the cause of diseases classified elsewhere: Secondary | ICD-10-CM | POA: Diagnosis not present

## 2017-11-11 DIAGNOSIS — Z510 Encounter for antineoplastic radiation therapy: Secondary | ICD-10-CM | POA: Diagnosis not present

## 2017-11-11 DIAGNOSIS — C8333 Diffuse large B-cell lymphoma, intra-abdominal lymph nodes: Secondary | ICD-10-CM | POA: Diagnosis not present

## 2017-11-11 DIAGNOSIS — M549 Dorsalgia, unspecified: Secondary | ICD-10-CM | POA: Diagnosis not present

## 2017-11-11 DIAGNOSIS — M129 Arthropathy, unspecified: Secondary | ICD-10-CM | POA: Diagnosis not present

## 2017-11-11 DIAGNOSIS — I2510 Atherosclerotic heart disease of native coronary artery without angina pectoris: Secondary | ICD-10-CM | POA: Diagnosis not present

## 2017-11-12 ENCOUNTER — Ambulatory Visit
Admission: RE | Admit: 2017-11-12 | Discharge: 2017-11-12 | Disposition: A | Discharge status: Home / Self Care | Payer: Medicare Other | Source: Ambulatory Visit | Attending: Radiation Oncology | Admitting: Radiation Oncology

## 2017-11-12 DIAGNOSIS — Z510 Encounter for antineoplastic radiation therapy: Secondary | ICD-10-CM | POA: Diagnosis not present

## 2017-11-12 DIAGNOSIS — I2510 Atherosclerotic heart disease of native coronary artery without angina pectoris: Secondary | ICD-10-CM | POA: Diagnosis not present

## 2017-11-12 DIAGNOSIS — M549 Dorsalgia, unspecified: Secondary | ICD-10-CM | POA: Diagnosis not present

## 2017-11-12 DIAGNOSIS — C8333 Diffuse large B-cell lymphoma, intra-abdominal lymph nodes: Secondary | ICD-10-CM | POA: Diagnosis not present

## 2017-11-12 DIAGNOSIS — M129 Arthropathy, unspecified: Secondary | ICD-10-CM | POA: Diagnosis not present

## 2017-11-12 DIAGNOSIS — B9681 Helicobacter pylori [H. pylori] as the cause of diseases classified elsewhere: Secondary | ICD-10-CM | POA: Diagnosis not present

## 2017-11-13 ENCOUNTER — Ambulatory Visit
Admission: RE | Admit: 2017-11-13 | Discharge: 2017-11-13 | Disposition: A | Discharge status: Home / Self Care | Payer: Medicare Other | Source: Ambulatory Visit | Attending: Radiation Oncology | Admitting: Radiation Oncology

## 2017-11-13 DIAGNOSIS — M129 Arthropathy, unspecified: Secondary | ICD-10-CM | POA: Diagnosis not present

## 2017-11-13 DIAGNOSIS — B9681 Helicobacter pylori [H. pylori] as the cause of diseases classified elsewhere: Secondary | ICD-10-CM | POA: Diagnosis not present

## 2017-11-13 DIAGNOSIS — M549 Dorsalgia, unspecified: Secondary | ICD-10-CM | POA: Diagnosis not present

## 2017-11-13 DIAGNOSIS — I2510 Atherosclerotic heart disease of native coronary artery without angina pectoris: Secondary | ICD-10-CM | POA: Diagnosis not present

## 2017-11-13 DIAGNOSIS — C8333 Diffuse large B-cell lymphoma, intra-abdominal lymph nodes: Secondary | ICD-10-CM | POA: Diagnosis not present

## 2017-11-13 DIAGNOSIS — Z510 Encounter for antineoplastic radiation therapy: Secondary | ICD-10-CM | POA: Diagnosis not present

## 2017-11-16 ENCOUNTER — Ambulatory Visit
Admission: RE | Admit: 2017-11-16 | Discharge: 2017-11-16 | Disposition: A | Discharge status: Home / Self Care | Payer: Medicare Other | Source: Ambulatory Visit | Attending: Radiation Oncology | Admitting: Radiation Oncology

## 2017-11-16 DIAGNOSIS — B9681 Helicobacter pylori [H. pylori] as the cause of diseases classified elsewhere: Secondary | ICD-10-CM | POA: Diagnosis not present

## 2017-11-16 DIAGNOSIS — M129 Arthropathy, unspecified: Secondary | ICD-10-CM | POA: Diagnosis not present

## 2017-11-16 DIAGNOSIS — C8333 Diffuse large B-cell lymphoma, intra-abdominal lymph nodes: Secondary | ICD-10-CM | POA: Diagnosis not present

## 2017-11-16 DIAGNOSIS — I2510 Atherosclerotic heart disease of native coronary artery without angina pectoris: Secondary | ICD-10-CM | POA: Diagnosis not present

## 2017-11-16 DIAGNOSIS — Z510 Encounter for antineoplastic radiation therapy: Secondary | ICD-10-CM | POA: Diagnosis not present

## 2017-11-16 DIAGNOSIS — M549 Dorsalgia, unspecified: Secondary | ICD-10-CM | POA: Diagnosis not present

## 2017-11-18 ENCOUNTER — Ambulatory Visit
Admission: RE | Admit: 2017-11-18 | Discharge: 2017-11-18 | Disposition: A | Discharge status: Home / Self Care | Payer: Medicare Other | Source: Ambulatory Visit | Attending: Radiation Oncology | Admitting: Radiation Oncology

## 2017-11-18 ENCOUNTER — Ambulatory Visit: Payer: Medicare Other

## 2017-11-18 DIAGNOSIS — C8333 Diffuse large B-cell lymphoma, intra-abdominal lymph nodes: Secondary | ICD-10-CM | POA: Diagnosis not present

## 2017-11-18 DIAGNOSIS — Z79899 Other long term (current) drug therapy: Secondary | ICD-10-CM | POA: Diagnosis not present

## 2017-11-18 DIAGNOSIS — I252 Old myocardial infarction: Secondary | ICD-10-CM | POA: Diagnosis not present

## 2017-11-18 DIAGNOSIS — Z951 Presence of aortocoronary bypass graft: Secondary | ICD-10-CM | POA: Diagnosis not present

## 2017-11-18 DIAGNOSIS — E785 Hyperlipidemia, unspecified: Secondary | ICD-10-CM | POA: Diagnosis not present

## 2017-11-18 DIAGNOSIS — M549 Dorsalgia, unspecified: Secondary | ICD-10-CM | POA: Diagnosis not present

## 2017-11-18 DIAGNOSIS — B9681 Helicobacter pylori [H. pylori] as the cause of diseases classified elsewhere: Secondary | ICD-10-CM | POA: Diagnosis not present

## 2017-11-18 DIAGNOSIS — M129 Arthropathy, unspecified: Secondary | ICD-10-CM | POA: Diagnosis not present

## 2017-11-18 DIAGNOSIS — I2510 Atherosclerotic heart disease of native coronary artery without angina pectoris: Secondary | ICD-10-CM | POA: Diagnosis not present

## 2017-11-18 DIAGNOSIS — Z9221 Personal history of antineoplastic chemotherapy: Secondary | ICD-10-CM | POA: Diagnosis not present

## 2017-11-18 DIAGNOSIS — Z7982 Long term (current) use of aspirin: Secondary | ICD-10-CM | POA: Diagnosis not present

## 2017-11-18 DIAGNOSIS — I10 Essential (primary) hypertension: Secondary | ICD-10-CM | POA: Diagnosis not present

## 2017-11-18 DIAGNOSIS — Z510 Encounter for antineoplastic radiation therapy: Secondary | ICD-10-CM | POA: Diagnosis present

## 2017-11-18 DIAGNOSIS — D509 Iron deficiency anemia, unspecified: Secondary | ICD-10-CM | POA: Diagnosis not present

## 2017-11-18 DIAGNOSIS — Z87891 Personal history of nicotine dependence: Secondary | ICD-10-CM | POA: Diagnosis not present

## 2017-11-19 ENCOUNTER — Ambulatory Visit
Admission: RE | Admit: 2017-11-19 | Discharge: 2017-11-19 | Disposition: A | Discharge status: Home / Self Care | Payer: Medicare Other | Source: Ambulatory Visit | Attending: Radiation Oncology | Admitting: Radiation Oncology

## 2017-11-19 DIAGNOSIS — Z510 Encounter for antineoplastic radiation therapy: Secondary | ICD-10-CM | POA: Diagnosis not present

## 2017-11-19 DIAGNOSIS — C8333 Diffuse large B-cell lymphoma, intra-abdominal lymph nodes: Secondary | ICD-10-CM | POA: Diagnosis not present

## 2017-11-23 ENCOUNTER — Encounter: Payer: Self-pay | Admitting: Family Medicine

## 2017-11-23 ENCOUNTER — Ambulatory Visit (INDEPENDENT_AMBULATORY_CARE_PROVIDER_SITE_OTHER): Payer: Medicare Other | Admitting: Family Medicine

## 2017-11-23 VITALS — BP 137/71 | HR 71 | Temp 97.6°F | Resp 16 | Ht 70.0 in | Wt 211.6 lb

## 2017-11-23 DIAGNOSIS — C8599 Non-Hodgkin lymphoma, unspecified, extranodal and solid organ sites: Secondary | ICD-10-CM

## 2017-11-23 DIAGNOSIS — I2510 Atherosclerotic heart disease of native coronary artery without angina pectoris: Secondary | ICD-10-CM | POA: Diagnosis not present

## 2017-11-23 DIAGNOSIS — R7303 Prediabetes: Secondary | ICD-10-CM | POA: Diagnosis not present

## 2017-11-23 DIAGNOSIS — I10 Essential (primary) hypertension: Secondary | ICD-10-CM | POA: Diagnosis not present

## 2017-11-23 DIAGNOSIS — E782 Mixed hyperlipidemia: Secondary | ICD-10-CM | POA: Diagnosis not present

## 2017-11-23 DIAGNOSIS — R079 Chest pain, unspecified: Secondary | ICD-10-CM | POA: Diagnosis not present

## 2017-11-23 LAB — POCT GLYCOSYLATED HEMOGLOBIN (HGB A1C): Hemoglobin A1C: 6.3 — AB (ref ?–5.7)

## 2017-11-23 NOTE — Assessment & Plan Note (Signed)
S/p Chemotherapy per [facility] Follow-up PET Scan Feb 2019 determine status

## 2017-11-23 NOTE — Assessment & Plan Note (Signed)
Stable A1c 6.3 from 6.2, improving diet lifestyle off chemo Concern with HTN, HLD, CAD  Plan:  1. Not on any therapy currently (remain off Metformin now >2 yr) 2. Encourage improved lifestyle - may try Atkins diet, low carb, low sugar diet, reduce portion size, start regular exercise 3. Follow-up 4 months PreDM A1c

## 2017-11-23 NOTE — Patient Instructions (Addendum)
Thank you for coming to the office today.   1. Keep up great work with your future plan with diet and do think Atkins may be beneficial.  No meds today A1c 6.3, stable from 6.2  Please schedule a Follow-up Appointment to: Return in about 4 months (around 03/23/2018) for Pre-DM A1c.  If you have any other questions or concerns, please feel free to call the office or send a message through [facility]. You may also schedule an earlier appointment if necessary.  Additionally, you may be receiving a survey about your experience at our office within a few days to 1 week by e-mail or mail. We value your feedback.  [name], DO [place]

## 2017-11-23 NOTE — Assessment & Plan Note (Signed)
Controlled HTN - Home BP readings not available  Complicated by CAD    Plan:  1. Continue current BP regimen Coreg 3.125mg  BID, Lisinopril 2.5mg  2. Encourage improved lifestyle - low sodium diet, regular exercise 3. Continue monitor BP outside office, bring readings to next visit, if persistently >140/90 or new symptoms notify office sooner 4. Follow-up 4 months

## 2017-11-23 NOTE — Progress Notes (Signed)
Subjective:    Patient ID: Corey Daniel, male    DOB: 1946/06/08, 72 y.o.   MRN: [id number]  Corey Daniel is a 72 y.o. male presenting on 11/23/2017 for Diabetes   HPI  Pre-Diabetes: Reports doing well, with goals to keep improving A1c. His goal is < 6, and he wants to avoid restarting metformin or other med. In past A1c 6.5 to 6.2 CBGs: Not checking CBG Meds: None (off Metformin >2 years) Currently on ACEi Lifestyle: - Diet (Dramatic diet improvement over past 1 week in 2019 now after finished Chemotherapy, he was advised to not intentionally lose weight from diet while on chemo, now he has reduced carbs, starches, sugars and portion size, lost 3-5 lbs) - Exercise (Gradually improving regular exercise) Denies hypoglycemia, polyuria, visual changes, numbness or tingling.  CHRONIC HTN: Reports no concerns with BP. Monitoring outside office, was low on chemotherapy. Current Meds - Coreg 3.125mg  BID, Lisinopril 2.5mg  Reports good compliance, took meds today. Tolerating well, w/o complaints. Denies CP, dyspnea, HA, edema, dizziness / lightheadedness  Additional update: Diffuse Large B-Cell Lymphoma, Stomach - s/p chemotherapy, has PET scan and follow-up coming up  Health Maintenance: UTD  Depression screen [facility] 2/9 11/23/2017 10/06/2017 08/07/2016  Decreased Interest 0 0 0  Down, Depressed, Hopeless 0 0 0  PHQ - 2 Score 0 0 0    Social History   Tobacco Use  . Smoking status: Former Smoker    Packs/day: 0.50    Years: 30.00    Pack years: 15.00    Types: Cigarettes    Last attempt to quit: 1983    Years since quitting: 36.0  . Smokeless tobacco: Never Used  Substance Use Topics  . Alcohol use: No  . Drug use: No    Review of Systems Per HPI unless specifically indicated above     Objective:    BP 137/71 (BP Location: Right Arm, Patient Position: Sitting, Cuff Size: Large)   Pulse 71   Temp 97.6 F (36.4 C) (Oral)   Resp 16   Ht 5\' 10"  (1.778 m)   Wt 211  lb 9.6 oz (96 kg)   BMI 30.36 kg/m   Wt Readings from Last 3 Encounters:  11/23/17 211 lb 9.6 oz (96 kg)  11/03/17 215 lb 11.5 oz (97.8 kg)  10/06/17 218 lb 6.4 oz (99.1 kg)    Physical Exam  Constitutional: He is oriented to person, place, and time. He appears well-developed and well-nourished. No distress.  Well-appearing, comfortable, cooperative  HENT:  Head: Normocephalic and atraumatic.  Mouth/Throat: Oropharynx is clear and moist.  Eyes: Conjunctivae are normal. Right eye exhibits no discharge. Left eye exhibits no discharge.  Neck: Normal range of motion. Neck supple.  Cardiovascular: Normal rate, regular rhythm, normal heart sounds and intact distal pulses.  No murmur heard. Pulmonary/Chest: Effort normal and breath sounds normal. No respiratory distress. He has no wheezes. He has no rales.  Musculoskeletal: He exhibits no edema.  Lymphadenopathy:    He has no cervical adenopathy.  Neurological: He is alert and oriented to person, place, and time.  Skin: Skin is warm and dry. No rash noted. He is not diaphoretic. No erythema.  Psychiatric: He has a normal mood and affect. His behavior is normal.  Well groomed, good eye contact, normal speech and thoughts  Nursing note and vitals reviewed.  Results for orders placed or performed in visit on 11/23/17  POCT glycosylated hemoglobin (Hb A1C)  Result Value Ref Range   Hemoglobin A1C  6.3 (A) 5.7      Assessment & Plan:   Problem List Items Addressed This Visit    Essential hypertension    Controlled HTN - Home BP readings not available  Complicated by CAD    Plan:  1. Continue current BP regimen Coreg 3.125mg  BID, Lisinopril 2.5mg  2. Encourage improved lifestyle - low sodium diet, regular exercise 3. Continue monitor BP outside office, bring readings to next visit, if persistently >140/90 or new symptoms notify office sooner 4. Follow-up 4 months      Lymphoma of fundus of stomach [facility])    S/p Chemotherapy per  [facility] Follow-up PET Scan Feb 2019 determine status      Pre-diabetes - Primary    Stable A1c 6.3 from 6.2, improving diet lifestyle off chemo Concern with HTN, HLD, CAD  Plan:  1. Not on any therapy currently (remain off Metformin now >2 yr) 2. Encourage improved lifestyle - may try Atkins diet, low carb, low sugar diet, reduce portion size, start regular exercise 3. Follow-up 4 months PreDM A1c      Relevant Orders   POCT glycosylated hemoglobin (Hb A1C) (Completed)      No orders of the defined types were placed in this encounter.   Follow up plan: Return in about 4 months (around 03/23/2018) for Pre-DM A1c.  [name], [facility] 11/23/2017, 8:41 AM

## 2017-12-08 DIAGNOSIS — D2261 Melanocytic nevi of right upper limb, including shoulder: Secondary | ICD-10-CM | POA: Diagnosis not present

## 2017-12-08 DIAGNOSIS — L821 Other seborrheic keratosis: Secondary | ICD-10-CM | POA: Diagnosis not present

## 2017-12-08 DIAGNOSIS — Z85828 Personal history of other malignant neoplasm of skin: Secondary | ICD-10-CM | POA: Diagnosis not present

## 2017-12-08 DIAGNOSIS — D225 Melanocytic nevi of trunk: Secondary | ICD-10-CM | POA: Diagnosis not present

## 2017-12-15 ENCOUNTER — Inpatient Hospital Stay: Payer: Medicare Other | Attending: Internal Medicine

## 2017-12-15 DIAGNOSIS — Z452 Encounter for adjustment and management of vascular access device: Secondary | ICD-10-CM | POA: Diagnosis not present

## 2017-12-15 DIAGNOSIS — Z95828 Presence of other vascular implants and grafts: Secondary | ICD-10-CM

## 2017-12-15 DIAGNOSIS — C8333 Diffuse large B-cell lymphoma, intra-abdominal lymph nodes: Secondary | ICD-10-CM | POA: Insufficient documentation

## 2017-12-15 MED ORDER — SODIUM CHLORIDE 0.9% FLUSH
10.0000 mL | INTRAVENOUS | Status: DC | PRN
  Administered 2017-12-15: 10 mL via INTRAVENOUS
  Filled 2017-12-15: qty 10

## 2017-12-15 MED ORDER — HEPARIN SOD (PORK) LOCK FLUSH 100 UNIT/ML IV SOLN
500.0000 [IU] | Freq: Once | INTRAVENOUS | Status: AC
  Administered 2017-12-15: 500 [IU] via INTRAVENOUS

## 2017-12-25 ENCOUNTER — Encounter: Payer: Self-pay | Admitting: Radiation Oncology

## 2017-12-25 ENCOUNTER — Ambulatory Visit
Admission: RE | Admit: 2017-12-25 | Discharge: 2017-12-25 | Disposition: A | Discharge status: Home / Self Care | Payer: Medicare Other | Source: Ambulatory Visit | Attending: Radiation Oncology | Admitting: Radiation Oncology

## 2017-12-25 ENCOUNTER — Other Ambulatory Visit: Payer: Self-pay

## 2017-12-25 VITALS — BP 130/79 | HR 65 | Temp 96.6°F | Resp 20 | Wt 209.8 lb

## 2017-12-25 DIAGNOSIS — Z9221 Personal history of antineoplastic chemotherapy: Secondary | ICD-10-CM | POA: Diagnosis not present

## 2017-12-25 DIAGNOSIS — Z923 Personal history of irradiation: Secondary | ICD-10-CM | POA: Diagnosis not present

## 2017-12-25 DIAGNOSIS — C8333 Diffuse large B-cell lymphoma, intra-abdominal lymph nodes: Secondary | ICD-10-CM | POA: Insufficient documentation

## 2017-12-25 DIAGNOSIS — C8599 Non-Hodgkin lymphoma, unspecified, extranodal and solid organ sites: Secondary | ICD-10-CM

## 2017-12-25 NOTE — Progress Notes (Signed)
Radiation Oncology Follow up Note  Name: Corey Daniel   Date:   12/25/2017 MRN:  [id number] DOB: Mar 28, 1946    This 72 y.o. male presents to the clinic today for one-month follow-up status post radiation therapy to his stomach for involvement of diffuse large B-cell lymphoma involved field treatment status post chemotherapy.  REFERRING PROVIDER: [name] *  HPI: Patient is a 72 year old male. Originally presented with dyspepsia upper endoscopy revealed a mass in the fundus of the stomach biopsy positive for diffuse B-cell lymphoma CD20 positive. Pets PET/CT confirmed hypermetabolic activity in the fundus of the stomach he underwent R CHOP chemotherapy and then received involved field radiation therapy. He is one month out from involved field radiation he is doing well. He specifically denies dyspepsia nausea or weight loss. His by mouth intake is good he is having no dysphagia.  COMPLICATIONS OF TREATMENT: none  FOLLOW UP COMPLIANCE: keeps appointments   PHYSICAL EXAM:  BP 130/79   Pulse 65   Temp (!) 96.6 F (35.9 C)   Resp 20   Wt 209 lb 12.3 oz (95.1 kg)   BMI 30.10 kg/m  Well-developed well-nourished patient in NAD. HEENT reveals PERLA, EOMI, discs not visualized.  Oral cavity is clear. No oral mucosal lesions are identified. Neck is clear without evidence of cervical or supraclavicular adenopathy. Lungs are clear to A&P. Cardiac examination is essentially unremarkable with regular rate and rhythm without murmur rub or thrill. Abdomen is benign with no organomegaly or masses noted. Motor sensory and DTR levels are equal and symmetric in the upper and lower extremities. Cranial nerves II through XII are grossly intact. Proprioception is intact. No peripheral adenopathy or edema is identified. No motor or sensory levels are noted. Crude visual fields are within normal range.  RADIOLOGY RESULTS: Patient is scheduled for PET CT scan which I will review the end of this  month  PLAN: Present time he is doing well actually feels better than he has in a year. No sniffing side effects or complaints. I'm please was overall progress. I will review his PET CT scan when it becomes available. I will see him back in 4-5 months for follow-up. He continues close follow-up care with medical oncology. Patient is to call with any concerns.  I would like to take this opportunity to thank you for allowing me to participate in the care of your patient.Noreene Filbert, MD

## 2018-01-07 ENCOUNTER — Encounter
Admission: RE | Admit: 2018-01-07 | Discharge: 2018-01-07 | Disposition: A | Discharge status: Home / Self Care | Payer: Medicare Other | Source: Ambulatory Visit | Attending: Internal Medicine | Admitting: Internal Medicine

## 2018-01-07 DIAGNOSIS — C8590 Non-Hodgkin lymphoma, unspecified, unspecified site: Secondary | ICD-10-CM | POA: Diagnosis not present

## 2018-01-07 DIAGNOSIS — C8599 Non-Hodgkin lymphoma, unspecified, extranodal and solid organ sites: Secondary | ICD-10-CM | POA: Insufficient documentation

## 2018-01-07 LAB — GLUCOSE, CAPILLARY: Glucose-Capillary: 122 mg/dL — ABNORMAL HIGH (ref 65–99)

## 2018-01-07 MED ORDER — FLUDEOXYGLUCOSE F - 18 (FDG) INJECTION
12.2100 | Freq: Once | INTRAVENOUS | Status: AC | PRN
  Administered 2018-01-07: 12.21 via INTRAVENOUS

## 2018-01-12 ENCOUNTER — Encounter: Payer: Self-pay | Admitting: Internal Medicine

## 2018-01-12 ENCOUNTER — Inpatient Hospital Stay: Payer: Medicare Other | Attending: Internal Medicine | Admitting: Internal Medicine

## 2018-01-12 ENCOUNTER — Inpatient Hospital Stay: Payer: Medicare Other

## 2018-01-12 VITALS — BP 137/82 | HR 73 | Temp 97.8°F | Resp 16 | Wt 209.0 lb

## 2018-01-12 DIAGNOSIS — Z9221 Personal history of antineoplastic chemotherapy: Secondary | ICD-10-CM

## 2018-01-12 DIAGNOSIS — M129 Arthropathy, unspecified: Secondary | ICD-10-CM | POA: Insufficient documentation

## 2018-01-12 DIAGNOSIS — Z87891 Personal history of nicotine dependence: Secondary | ICD-10-CM | POA: Diagnosis not present

## 2018-01-12 DIAGNOSIS — I10 Essential (primary) hypertension: Secondary | ICD-10-CM | POA: Diagnosis not present

## 2018-01-12 DIAGNOSIS — C8599 Non-Hodgkin lymphoma, unspecified, extranodal and solid organ sites: Secondary | ICD-10-CM

## 2018-01-12 DIAGNOSIS — E785 Hyperlipidemia, unspecified: Secondary | ICD-10-CM | POA: Insufficient documentation

## 2018-01-12 DIAGNOSIS — I700 Atherosclerosis of aorta: Secondary | ICD-10-CM | POA: Diagnosis not present

## 2018-01-12 DIAGNOSIS — Z79899 Other long term (current) drug therapy: Secondary | ICD-10-CM | POA: Insufficient documentation

## 2018-01-12 DIAGNOSIS — I2510 Atherosclerotic heart disease of native coronary artery without angina pectoris: Secondary | ICD-10-CM | POA: Insufficient documentation

## 2018-01-12 DIAGNOSIS — Z8719 Personal history of other diseases of the digestive system: Secondary | ICD-10-CM

## 2018-01-12 DIAGNOSIS — I252 Old myocardial infarction: Secondary | ICD-10-CM | POA: Insufficient documentation

## 2018-01-12 DIAGNOSIS — Z7982 Long term (current) use of aspirin: Secondary | ICD-10-CM | POA: Diagnosis not present

## 2018-01-12 DIAGNOSIS — C8333 Diffuse large B-cell lymphoma, intra-abdominal lymph nodes: Secondary | ICD-10-CM | POA: Insufficient documentation

## 2018-01-12 DIAGNOSIS — M545 Low back pain: Secondary | ICD-10-CM | POA: Insufficient documentation

## 2018-01-12 DIAGNOSIS — R918 Other nonspecific abnormal finding of lung field: Secondary | ICD-10-CM

## 2018-01-12 DIAGNOSIS — Z923 Personal history of irradiation: Secondary | ICD-10-CM | POA: Insufficient documentation

## 2018-01-12 DIAGNOSIS — K760 Fatty (change of) liver, not elsewhere classified: Secondary | ICD-10-CM | POA: Insufficient documentation

## 2018-01-12 LAB — COMPREHENSIVE METABOLIC PANEL
ALBUMIN: 4.3 g/dL (ref 3.5–5.0)
ALK PHOS: 55 U/L (ref 38–126)
ALT: 28 U/L (ref 17–63)
AST: 29 U/L (ref 15–41)
Anion gap: 7 (ref 5–15)
BILIRUBIN TOTAL: 0.4 mg/dL (ref 0.3–1.2)
BUN: 17 mg/dL (ref 6–20)
CALCIUM: 8.9 mg/dL (ref 8.9–10.3)
CO2: 25 mmol/L (ref 22–32)
CREATININE: 0.93 mg/dL (ref 0.61–1.24)
Chloride: 108 mmol/L (ref 101–111)
GFR calc Af Amer: >60 mL/min (ref >60)
GFR calc non Af Amer: >60 mL/min (ref >60)
GLUCOSE: 120 mg/dL — AB (ref 65–99)
Potassium: 4.3 mmol/L (ref 3.5–5.1)
SODIUM: 140 mmol/L (ref 135–145)
Total Protein: 7.4 g/dL (ref 6.5–8.1)

## 2018-01-12 LAB — CBC WITH DIFFERENTIAL/PLATELET
Basophils Absolute: 0.1 10*3/uL (ref 0–0.1)
Basophils Relative: 2 %
EOS PCT: 8 %
Eosinophils Absolute: 0.3 10*3/uL (ref 0–0.7)
HCT: 40.7 % (ref 40.0–52.0)
Hemoglobin: 14.2 g/dL (ref 13.0–18.0)
LYMPHS PCT: 18 %
Lymphs Abs: 0.6 10*3/uL — ABNORMAL LOW (ref 1.0–3.6)
MCH: 31 pg (ref 26.0–34.0)
MCHC: 35 g/dL (ref 32.0–36.0)
MCV: 88.7 fL (ref 80.0–100.0)
Monocytes Absolute: 0.7 10*3/uL (ref 0.2–1.0)
Monocytes Relative: 22 %
Neutro Abs: 1.6 10*3/uL (ref 1.4–6.5)
Neutrophils Relative %: 50 %
PLATELETS: 131 10*3/uL — AB (ref 150–440)
RBC: 4.58 MIL/uL (ref 4.40–5.90)
RDW: 13.4 % (ref 11.5–14.5)
WBC: 3.3 10*3/uL — ABNORMAL LOW (ref 3.8–10.6)

## 2018-01-12 MED ORDER — HEPARIN SOD (PORK) LOCK FLUSH 100 UNIT/ML IV SOLN
500.0000 [IU] | Freq: Once | INTRAVENOUS | Status: AC
  Administered 2018-01-12: 500 [IU] via INTRAVENOUS

## 2018-01-12 MED ORDER — SODIUM CHLORIDE 0.9% FLUSH
10.0000 mL | INTRAVENOUS | Status: DC | PRN
  Administered 2018-01-12: 10 mL via INTRAVENOUS
  Filled 2018-01-12: qty 10

## 2018-01-12 NOTE — Addendum Note (Signed)
Addended by: Zara Chess on: 01/12/2018 09:50 AM   Modules accepted: Orders, SmartSet

## 2018-01-12 NOTE — Assessment & Plan Note (Addendum)
#   Diffuse large B-cell lymphoma of the stomach;STAGE I E.  # s/p cycle # 3 of R CHOP chemotherapy- currently s/p IFRT consolidation- finished  Jan 3rd 2018. Feb 2019- PET scan- CR.  # H.pylori positive; gastritis- okay to proceed with EGD if needed by GI. Discussed with pt.   # white count 3.3/ ALC- 600 sec to RT.   # port explantation- refer to IR for explantation.   # I reviewed the blood work- with the patient in detail; also reviewed the imaging independently [as summarized above]; and with the patient in detail.   # 25 minutes face-to-face with the patient discussing the above plan of care; more than 50% of time spent on prognosis/ natural history; counseling and coordination.  Cc; Dr.Huang/Dr.Crystal/Dr.K

## 2018-01-12 NOTE — Progress Notes (Signed)
[place] NOTE  Patient Care Team: Olin Hauser, DO as PCP - General (Family Medicine)  CHIEF COMPLAINTS/PURPOSE OF CONSULTATION:  Diffuse large B-cell lymphoma  #  Oncology History   # AUG 2018- DIFFUSE LARGE B CELL LYMPHOMA STOMACH-STAGE IE [BMBx-NEG]Fundus ;CD-20 pos;variable- bcl6/mum-?? GCB vs. ABC subtype;  [EGD for IDA; Dr.Hung; [place]]- NEG for FISH for;myc; bcl-6; bcl-2;11:14; MALT [8q21];NEG for CD-10; ki-67-70%; SEP PET- uptake in stomach. Hepatitis panel-NEG; BMBx-NEG  # SEP 25th 2018- R-CHOP x3 ; RT [until jan 3rd 2019]; PET Feb 2019- CR  # AUG 2018- H.pylori positive/stomach body.   # IDA-resolved  # CAD [s/p CABG; Dr.fath];MUGA scan [sep 2018]- 56.5%     Lymphoma of fundus of stomach ([place])     HISTORY OF PRESENTING ILLNESS:  Corey Daniel 72 y.o.  male with above history of diffuse large B-cell lymphoma of the stomach; Stage IE is s/p R CHOP chemotherapy #3; status post radiation finished in January 2019.  He is yet to review the results of his PET scan.  Denies any nausea vomiting.  Denies any abdominal pain.  Appetite is good. No weight loss.  He denies any significant tingling and numbess in the extemities.  Mouth sores have resolved.  He has not had a repeat endoscopy yet.  Denies any blood in stools or black colored stools.  ROS: A complete 10 point review of system is done which is negative except mentioned above in history of present illness  MEDICAL HISTORY:  Past Medical History:  Diagnosis Date  . Arthritis   . ASCVD (arteriosclerotic cardiovascular disease)   . Back pain    with radiation  . Cancer ([place])    lymphoma  . Coronary artery disease   . Hyperlipidemia   . Hypertension   . IDA (iron deficiency anemia)   . Lower leg pain   . Myocardial infarction [facility])     SURGICAL HISTORY: Past Surgical History:  Procedure Laterality Date  . AUTOGRAFT STRUCTURAL ICBG OBTAINED SEPARATE INCISION FOR SPINE SURGERY    Bilateral 07/15/2013  . BACK SURGERY    . CATARACT EXTRACTION  2010  . COLONOSCOPY  06/2017  . CORONARY ARTERY BYPASS GRAFT    . IR FLUORO GUIDE PORT INSERTION LEFT  07/31/2017  . LAMINECTOMY LUMBAR EXCISON/EVACUATION EXTRADURAL INTRASPINAL LESION Bilateral 07/15/2013     . LAMINOTOMY REEXPLORATION POSTERIOR LUMBAR W/NERVE DECOMP & DISCECTOMY Bilateral 07/15/2013     . UPPER GI ENDOSCOPY  06/2017    SOCIAL HISTORY: He used to be in the [employer] 30-pack-year history of smoking quit 30 years ago. No alcohol. Lives with his wife in [place].  Social History   Socioeconomic History  . Marital status: Married    Spouse name: Not on file  . Number of children: Not on file  . Years of education: Not on file  . Highest education level: Not on file  Social Needs  . Financial resource strain: Not hard at all  . Food insecurity - worry: Never true  . Food insecurity - inability: Never true  . Transportation needs - medical: No  . Transportation needs - non-medical: No  Occupational History  . Not on file  Tobacco Use  . Smoking status: Former Smoker    Packs/day: 0.50    Years: 30.00    Pack years: 15.00    Types: Cigarettes    Last attempt to quit: 1983    Years since quitting: 36.1  . Smokeless tobacco: Never Used  Substance and  Sexual Activity  . Alcohol use: No  . Drug use: No  . Sexual activity: Yes  Other Topics Concern  . Not on file  Social History Narrative  . Not on file    FAMILY HISTORY:Father with Hodgkin's lymphoma in the 8s. Family History  Problem Relation Age of Onset  . Heart disease Mother   . Coarctation of the aorta Brother     ALLERGIES:  has No Known Allergies.  MEDICATIONS:  Current Outpatient Medications  Medication Sig Dispense Refill  . aspirin 81 MG chewable tablet Chew 81 mg by mouth daily.     . carvedilol (COREG) 3.125 MG tablet Take 1 tablet (3.125 mg total) by mouth 2 (two) times daily with a meal. 180 tablet 3  . Cholecalciferol (VITAMIN  D3) 2000 UNITS capsule Take 2,000 Units by mouth daily.     . Coenzyme Q10 100 MG capsule Take by mouth.    . ezetimibe (ZETIA) 10 MG tablet Take 1 tablet (10 mg total) by mouth at bedtime. 90 tablet 3  . Fish Oil-Cholecalciferol (FISH OIL + D3) 1000-1000 MG-UNIT CAPS Take by mouth.    . fluticasone (FLONASE) 50 MCG/ACT nasal spray Place into the nose.    . lidocaine-prilocaine (EMLA) cream Apply 1 application topically as needed. Apply generously over the Mediport 45 minutes prior to chemotherapy. 30 g 0  . lisinopril (ZESTRIL) 2.5 MG tablet Take 1 tablet (2.5 mg total) by mouth daily. 90 tablet 3  . omeprazole (PRILOSEC) 40 MG capsule daily as needed.     . rosuvastatin (CRESTOR) 5 MG tablet Take 1 tablet (5 mg total) by mouth 3 (three) times a week. 3 times weekly 36 tablet 3   No current facility-administered medications for this visit.    Facility-Administered Medications Ordered in Other Visits  Medication Dose Route Frequency Provider Last Rate Last Dose  . sodium chloride flush (NS) 0.9 % injection 10 mL  10 mL Intravenous PRN Cammie Sickle, MD   10 mL at 01/12/18 0950      .  PHYSICAL EXAMINATION: ECOG PERFORMANCE STATUS: 0 - Asymptomatic  Vitals:   01/12/18 0951 01/12/18 0958  BP:  137/82  Pulse:  73  Resp: 16 16  Temp:  97.8 F (36.6 C)   Filed Weights   01/12/18 0951 01/12/18 0958  Weight: 209 lb (94.8 kg) 209 lb (94.8 kg)    GENERAL: Well-nourished well-developed; Alert, no distress and comfortable.  He is alone. EYES: no pallor or icterus OROPHARYNX: no thrush or ulceration; good dentition  NECK: supple, no masses felt LYMPH:  no palpable lymphadenopathy in the cervical, axillary or inguinal regions LUNGS: clear to auscultation and  No wheeze or crackles HEART/CVS: regular rate & rhythm and no murmurs; No lower extremity edema ABDOMEN: abdomen soft, non-tender and normal bowel sounds Musculoskeletal:no cyanosis of digits and no clubbing  PSYCH: alert  & oriented x 3 with fluent speech NEURO: no focal motor/sensory deficits SKIN:  no rashes or significant lesions; positive for alopecia.  LABORATORY DATA:  I have reviewed the data as listed Lab Results  Component Value Date   WBC 3.3 (L) 01/12/2018   HGB 14.2 01/12/2018   HCT 40.7 01/12/2018   MCV 88.7 01/12/2018   PLT 131 (L) 01/12/2018   Recent Labs    09/01/17 0824  09/22/17 0908 11/03/17 0955 01/12/18 0937  NA 138   < > 138 139 140  K 3.9   < > 4.1 4.0 4.3  CL 105   < >  106 106 108  CO2 26   < > _0 GLUCOSE 147*   < > 129* 160* 120*  BUN 17   < > _1 CREATININE 0.87   < > 0.88 0.93 0.93  CALCIUM 8.8*   < > 8.9 8.7* 8.9  GFRNONAA >60   < > >60 >60 >60  GFRAA >60   < > >60 >60 >60  PROT 6.9  --   --  7.0 7.4  ALBUMIN 3.7  --   --  4.1 4.3  AST 23  --   --  24 29  ALT 23  --   --  19 28  ALKPHOS 66  --   --  58 55  BILITOT 0.6  --   --  0.7 0.4   < > = values in this interval not displayed.   IMPRESSION: 1. Isolated hypermetabolism in the gastric fundus, consistent with the clinical history of lymphoma. 2. [place] tonsil hypermetabolism is likely physiologic. Recommend attention on follow-up. 3. Bilateral nonspecific pulmonary nodules also warrant followup attention. 4.  Aortic Atherosclerosis (ICD10-I70.0). 5. Bilateral femoral head avascular necrosis. 6. Hepatic steatosis.   Electronically Signed   By: Abigail Miyamoto M.D.   On: 07/29/2017 14:19 ------------------------------------------------------------------    IMPRESSION: 1. Interval complete response to therapy. No evidence for residual or recurrent metabolically active tumor. 2. There is mild FDG uptake associated with right paratracheal and hilar lymph nodes which are calcified and likely reflects granulomatous inflammation. 3. Stable small nonspecific pulmonary nodules. 4.  Aortic Atherosclerosis (ICD10-I70.0).   Electronically Signed   By: Kerby Moors M.D.   On:  01/07/2018 10:35 RADIOGRAPHIC STUDIES: I have personally reviewed the radiological images as listed and agreed with the findings in the report. Nm Pet Image Restag (ps) Skull Base To Thigh  Result Date: 01/07/2018 CLINICAL DATA:  Subsequent treatment strategy for lymphoma. EXAM: NUCLEAR MEDICINE PET SKULL BASE TO THIGH TECHNIQUE: 12.2 mCi F-18 FDG was injected intravenously. Full-ring PET imaging was performed from the skull base to thigh after the radiotracer. CT data was obtained and used for attenuation correction and anatomic localization. FASTING BLOOD GLUCOSE:  Value: 122 mg/dl COMPARISON:  07/29/2017 FINDINGS: NECK: No hypermetabolic lymph nodes in the neck. CHEST: No hypermetabolic supraclavicular or axillary lymph nodes. Mild cardiac enlargement. Previous median sternotomy and CABG procedure. Aortic atherosclerosis identified. Calcification of the native coronary arteries identified. Calcified right paratracheal, right hilar and subcarinal lymph nodes identified. There is mild increased uptake within these lymph nodes likely reflecting low level inflammation secondary to granulomatous disease. SUV max within the right hilar node measures 3.85. Previously 2.3. Calcified granulomas identified within the posterior right lower lobe. Tiny nodule within the right middle lobe is unchanged measuring 4 mm. Stable perifissural nodule within the superior segment of right lower lobe measuring 4 mm. Posteromedial right upper lobe lung nodule is unchanged measuring 4 mm. No suspicious hypermetabolic pulmonary nodule or mass noted. ABDOMEN/PELVIS: No abnormal uptake within the liver. Unremarkable appearance of the pancreas. Calcified granulomas identified within the spleen. Spleen otherwise normal in size and FDG uptake. No abnormal uptake within the adrenal glands. Aortic atherosclerosis noted. No aneurysm. No hypermetabolic lymph nodes identified within the abdomen or pelvis. No inguinal hypermetabolic lymph nodes.  Interval resolution of previously identified isolated hypermetabolism in the gastric fundus. No metabolically active tumor identified at this time. SKELETON: No focal hypermetabolic activity to suggest skeletal metastasis. Area of relative photopenia within the thoracic spine likely reflects  radiation port. Bilateral femoral head AVN. Mediastinal blood pool activity: SUV max equals 2.88. Background liver activity has an SUV max of 3.56. IMPRESSION: 1. Interval complete response to therapy. No evidence for residual or recurrent metabolically active tumor. 2. There is mild FDG uptake associated with right paratracheal and hilar lymph nodes which are calcified and likely reflects granulomatous inflammation. 3. Stable small nonspecific pulmonary nodules. 4.  Aortic Atherosclerosis (ICD10-I70.0). Electronically Signed   By: Kerby Moors M.D.   On: 01/07/2018 10:35    ASSESSMENT & PLAN:   Lymphoma of fundus of stomach ([place]) # Diffuse large B-cell lymphoma of the stomach;STAGE I E.  # s/p cycle # 3 of R CHOP chemotherapy- currently s/p IFRT consolidation- finished  Jan 3rd 2018. Feb 2019- PET scan- CR.  # H.pylori positive; gastritis- okay to proceed with EGD if needed by GI. Discussed with pt.   # white count 3.3/ ALC- 600 sec to RT.   # port explantation- refer to IR for explantation.   # I reviewed the blood work- with the patient in detail; also reviewed the imaging independently [as summarized above]; and with the patient in detail.   # 25 minutes face-to-face with the patient discussing the above plan of care; more than 50% of time spent on prognosis/ natural history; counseling and coordination.  Cc; Dr.Huang/Dr.Crystal/Dr.K  All questions were answered. The patient knows to call the clinic with any problems, questions or concerns.    Cammie Sickle, MD 01/12/2018 11:26 AM

## 2018-01-22 ENCOUNTER — Encounter: Payer: Self-pay | Admitting: Internal Medicine

## 2018-01-26 DIAGNOSIS — K2970 Gastritis, unspecified, without bleeding: Secondary | ICD-10-CM | POA: Diagnosis not present

## 2018-01-26 DIAGNOSIS — K319 Disease of stomach and duodenum, unspecified: Secondary | ICD-10-CM | POA: Diagnosis not present

## 2018-02-02 ENCOUNTER — Other Ambulatory Visit: Payer: Self-pay | Admitting: Student

## 2018-02-03 ENCOUNTER — Ambulatory Visit: Payer: Medicare Other

## 2018-02-03 ENCOUNTER — Ambulatory Visit
Admission: RE | Admit: 2018-02-03 | Discharge: 2018-02-03 | Disposition: A | Discharge status: Home / Self Care | Payer: Medicare Other | Source: Ambulatory Visit | Attending: Internal Medicine | Admitting: Internal Medicine

## 2018-02-03 MED ORDER — CEFAZOLIN SODIUM-DEXTROSE 2-4 GM/100ML-% IV SOLN: 2.0000 g | INTRAVENOUS | Status: DC

## 2018-02-03 MED ORDER — SODIUM CHLORIDE 0.9 % IV SOLN: INTRAVENOUS | Status: DC

## 2018-02-11 ENCOUNTER — Other Ambulatory Visit: Payer: Self-pay | Admitting: Radiology

## 2018-02-12 ENCOUNTER — Ambulatory Visit
Admission: RE | Admit: 2018-02-12 | Discharge: 2018-02-12 | Disposition: A | Discharge status: Home / Self Care | Payer: Medicare Other | Source: Ambulatory Visit | Attending: Internal Medicine | Admitting: Internal Medicine

## 2018-02-12 DIAGNOSIS — I2510 Atherosclerotic heart disease of native coronary artery without angina pectoris: Secondary | ICD-10-CM | POA: Diagnosis not present

## 2018-02-12 DIAGNOSIS — Z951 Presence of aortocoronary bypass graft: Secondary | ICD-10-CM | POA: Diagnosis not present

## 2018-02-12 DIAGNOSIS — I255 Ischemic cardiomyopathy: Secondary | ICD-10-CM | POA: Insufficient documentation

## 2018-02-12 DIAGNOSIS — M1990 Unspecified osteoarthritis, unspecified site: Secondary | ICD-10-CM | POA: Diagnosis not present

## 2018-02-12 DIAGNOSIS — Z5111 Encounter for antineoplastic chemotherapy: Secondary | ICD-10-CM | POA: Diagnosis not present

## 2018-02-12 DIAGNOSIS — Z7982 Long term (current) use of aspirin: Secondary | ICD-10-CM | POA: Diagnosis not present

## 2018-02-12 DIAGNOSIS — Z79899 Other long term (current) drug therapy: Secondary | ICD-10-CM | POA: Diagnosis not present

## 2018-02-12 DIAGNOSIS — Z9849 Cataract extraction status, unspecified eye: Secondary | ICD-10-CM | POA: Diagnosis not present

## 2018-02-12 DIAGNOSIS — C8599 Non-Hodgkin lymphoma, unspecified, extranodal and solid organ sites: Secondary | ICD-10-CM

## 2018-02-12 DIAGNOSIS — Z452 Encounter for adjustment and management of vascular access device: Secondary | ICD-10-CM | POA: Insufficient documentation

## 2018-02-12 DIAGNOSIS — E785 Hyperlipidemia, unspecified: Secondary | ICD-10-CM | POA: Insufficient documentation

## 2018-02-12 DIAGNOSIS — Z9889 Other specified postprocedural states: Secondary | ICD-10-CM | POA: Insufficient documentation

## 2018-02-12 DIAGNOSIS — I10 Essential (primary) hypertension: Secondary | ICD-10-CM | POA: Insufficient documentation

## 2018-02-12 DIAGNOSIS — Z8572 Personal history of non-Hodgkin lymphomas: Secondary | ICD-10-CM | POA: Diagnosis not present

## 2018-02-12 DIAGNOSIS — Z462 Encounter for fitting and adjustment of other devices related to nervous system and special senses: Secondary | ICD-10-CM | POA: Diagnosis not present

## 2018-02-12 HISTORY — PX: IR REMOVAL TUN ACCESS W/ PORT W/O FL MOD SED: IMG2290

## 2018-02-12 LAB — CBC
HCT: 43.8 % (ref 40.0–52.0)
Hemoglobin: 14.7 g/dL (ref 13.0–18.0)
MCH: 29.8 pg (ref 26.0–34.0)
MCHC: 33.5 g/dL (ref 32.0–36.0)
MCV: 89.1 fL (ref 80.0–100.0)
PLATELETS: 143 10*3/uL — AB (ref 150–440)
RBC: 4.92 MIL/uL (ref 4.40–5.90)
RDW: 13.9 % (ref 11.5–14.5)
WBC: 4.8 10*3/uL (ref 3.8–10.6)

## 2018-02-12 LAB — APTT: APTT: 31 s (ref 24–36)

## 2018-02-12 LAB — PROTIME-INR
INR: 0.96
PROTHROMBIN TIME: 12.7 s (ref 11.4–15.2)

## 2018-02-12 MED ORDER — LIDOCAINE-EPINEPHRINE (PF) 1 %-1:200000 IJ SOLN
INTRAMUSCULAR | Status: AC | PRN
  Administered 2018-02-12: 15 mL

## 2018-02-12 MED ORDER — LIDOCAINE-EPINEPHRINE 1 %-1:200000 IJ SOLN
INTRAMUSCULAR | Status: AC
Start: 2018-02-12 — End: ?
  Filled 2018-02-12: qty 30

## 2018-02-12 MED ORDER — CEFAZOLIN SODIUM-DEXTROSE 2-4 GM/100ML-% IV SOLN
2.0000 g | Freq: Once | INTRAVENOUS | Status: AC
  Administered 2018-02-12: 2 g via INTRAVENOUS

## 2018-02-12 MED ORDER — MIDAZOLAM HCL 5 MG/5ML IJ SOLN
INTRAMUSCULAR | Status: AC | PRN
  Administered 2018-02-12: 1 mg via INTRAVENOUS

## 2018-02-12 MED ORDER — MIDAZOLAM HCL 5 MG/5ML IJ SOLN
INTRAMUSCULAR | Status: AC
  Filled 2018-02-12: qty 5

## 2018-02-12 MED ORDER — SODIUM CHLORIDE 0.9 % IV SOLN
INTRAVENOUS | Status: DC
  Administered 2018-02-12: 09:00:00 via INTRAVENOUS

## 2018-02-12 MED ORDER — FENTANYL CITRATE (PF) 100 MCG/2ML IJ SOLN
INTRAMUSCULAR | Status: AC
  Filled 2018-02-12: qty 2

## 2018-02-12 NOTE — Discharge Instructions (Signed)
Incision Care, Adult °An incision is a surgical cut that is made through your skin. Most incisions are closed after surgery. Your incision may be closed with stitches (sutures), staples, skin glue, or adhesive strips. You may need to return to your health care provider to have sutures or staples removed. This may occur several days to several weeks after your surgery. The incision needs to be cared for properly to prevent infection. °How to care for your incision °Incision care ° °· Follow instructions from your health care provider about how to take care of your incision. Make sure you: °? Wash your hands with soap and water before you change the bandage (dressing). If soap and water are not available, use hand sanitizer. °? Change your dressing as told by your health care provider. °? Leave sutures, skin glue, or adhesive strips in place. These skin closures may need to stay in place for 2 weeks or longer. If adhesive strip edges start to loosen and curl up, you may trim the loose edges. Do not remove adhesive strips completely unless your health care provider tells you to do that. °· Check your incision area every day for signs of infection. Check for: °? More redness, swelling, or pain. °? More fluid or blood. °? Warmth. °? Pus or a bad smell. °· Ask your health care provider how to clean the incision. This may include: °? Using mild soap and water. °? Using a clean towel to pat the incision dry after cleaning it. °? Applying a cream or ointment. Do this only as told by your health care provider. °? Covering the incision with a clean dressing. °· Ask your health care provider when you can leave the incision uncovered. °· Do not take baths, swim, or use a hot tub until your health care provider approves. Ask your health care provider if you can take showers. You may only be allowed to take sponge baths for bathing. °Medicines °· If you were prescribed an antibiotic medicine, cream, or ointment, take or apply the  antibiotic as told by your health care provider. Do not stop taking or applying the antibiotic even if your condition improves. °· Take over-the-counter and prescription medicines only as told by your health care provider. °General instructions °· Limit movement around your incision to improve healing. °? Avoid straining, lifting, or exercise for the first month, or for as long as told by your health care provider. °? Follow instructions from your health care provider about returning to your normal activities. °? Ask your health care provider what activities are safe. °· Protect your incision from the sun when you are outside for the first 6 months, or for as long as told by your health care provider. Apply sunscreen around the scar or cover it up. °· Keep all follow-up visits as told by your health care provider. This is important. °Contact a health care provider if: °· Your have more redness, swelling, or pain around the incision. °· You have more fluid or blood coming from the incision. °· Your incision feels warm to the touch. °· You have pus or a bad smell coming from the incision. °· You have a fever or shaking chills. °· You are nauseous or you vomit. °· You are dizzy. °· Your sutures or staples come undone. °Get help right away if: °· You have a red streak coming from your incision. °· Your incision bleeds through the dressing and the bleeding does not stop with gentle pressure. °· The edges of   your incision open up and separate. °· You have severe pain. °· You have a rash. °· You are confused. °· You faint. °· You have trouble breathing and a fast heartbeat. °This information is not intended to replace advice given to you by your health care provider. Make sure you discuss any questions you have with your health care provider. °Document Released: 05/23/2005 Document Revised: 07/11/2016 Document Reviewed: 05/21/2016 °Elsevier Interactive Patient Education © 2018 [employer] °Moderate Conscious Sedation,  Adult, Care After °These instructions provide you with information about caring for yourself after your procedure. Your health care provider may also give you more specific instructions. Your treatment has been planned according to current medical practices, but problems sometimes occur. Call your health care provider if you have any problems or questions after your procedure. °What can I expect after the procedure? °After your procedure, it is common: °· To feel sleepy for several hours. °· To feel clumsy and have poor balance for several hours. °· To have poor judgment for several hours. °· To vomit if you eat too soon. ° °Follow these instructions at home: °For at least 24 hours after the procedure: ° °· Do not: °? Participate in activities where you could fall or become injured. °? Drive. °? Use heavy machinery. °? Drink alcohol. °? Take sleeping pills or medicines that cause drowsiness. °? Make important decisions or sign legal documents. °? Take care of children on your own. °· Rest. °Eating and drinking °· Follow the diet recommended by your health care provider. °· If you vomit: °? Drink water, juice, or soup when you can drink without vomiting. °? Make sure you have little or no nausea before eating solid foods. °General instructions °· Have a responsible adult stay with you until you are awake and alert. °· Take over-the-counter and prescription medicines only as told by your health care provider. °· If you smoke, do not smoke without supervision. °· Keep all follow-up visits as told by your health care provider. This is important. °Contact a health care provider if: °· You keep feeling nauseous or you keep vomiting. °· You feel light-headed. °· You develop a rash. °· You have a fever. °Get help right away if: °· You have trouble breathing. °This information is not intended to replace advice given to you by your health care provider. Make sure you discuss any questions you have with your health care  provider. °Document Released: 08/24/2013 Document Revised: 04/07/2016 Document Reviewed: 02/23/2016 °Elsevier Interactive Patient Education © 2018 [employer] ° °

## 2018-02-12 NOTE — H&P (Signed)
Referring Physician(s): Cammie Sickle  Supervising Physician: Renaldo Reel  Patient Status:  [facility] OP  Chief Complaint:  "I'm getting my port out"  Subjective: Patient familiar to IR service from prior Port-A-Cath placement and bone marrow biopsy on 07/31/17.  He has a history of diffuse large B-cell lymphoma of stomach, status post treatment.  Recent PET scan shows no evidence of residual/recurrent disease and he presents again today for Port-A-Cath removal.  He currently denies fever, headache, chest pain, dyspnea, cough, abdominal/back pain, nausea, vomiting or bleeding. Past Medical History:  Diagnosis Date  . Arthritis   . ASCVD (arteriosclerotic cardiovascular disease)   . Back pain    with radiation  . Cancer ([place])    lymphoma  . Coronary artery disease   . Hyperlipidemia   . Hypertension   . IDA (iron deficiency anemia)   . Lower leg pain   . Myocardial infarction [facility])    Past Surgical History:  Procedure Laterality Date  . AUTOGRAFT STRUCTURAL ICBG OBTAINED SEPARATE INCISION FOR SPINE SURGERY   Bilateral 07/15/2013  . BACK SURGERY    . CATARACT EXTRACTION  2010  . COLONOSCOPY  06/2017  . CORONARY ARTERY BYPASS GRAFT    . IR FLUORO GUIDE PORT INSERTION LEFT  07/31/2017  . LAMINECTOMY LUMBAR EXCISON/EVACUATION EXTRADURAL INTRASPINAL LESION Bilateral 07/15/2013     . LAMINOTOMY REEXPLORATION POSTERIOR LUMBAR W/NERVE DECOMP & DISCECTOMY Bilateral 07/15/2013     . UPPER GI ENDOSCOPY  06/2017      Allergies: Patient has no known allergies.  Medications: Prior to Admission medications   Medication Sig Start Date End Date Taking? Authorizing Provider  aspirin 81 MG chewable tablet Chew 81 mg by mouth daily.    Yes [provider]  carvedilol (COREG) 3.125 MG tablet Take 1 tablet (3.125 mg total) by mouth 2 (two) times daily with a meal. 04/21/17  Yes Karamalegos, Devonne Doughty, DO  Cholecalciferol (VITAMIN D3) 2000 UNITS capsule Take 2,000 Units by mouth  daily.    Yes [provider]  Coenzyme Q10 100 MG capsule Take by mouth.   Yes [provider]  ezetimibe (ZETIA) 10 MG tablet Take 1 tablet (10 mg total) by mouth at bedtime. 04/21/17  Yes Karamalegos, Devonne Doughty, DO  Fish Oil-Cholecalciferol (FISH OIL + D3) 1000-1000 MG-UNIT CAPS Take by mouth.   Yes [provider]  lisinopril (ZESTRIL) 2.5 MG tablet Take 1 tablet (2.5 mg total) by mouth daily. 04/21/17  Yes Karamalegos, Devonne Doughty, DO  rosuvastatin (CRESTOR) 5 MG tablet Take 1 tablet (5 mg total) by mouth 3 (three) times a week. 3 times weekly 04/22/17  Yes Karamalegos, Alexander J, DO  fluticasone (FLONASE) 50 MCG/ACT nasal spray Place into the nose. 05/17/17   [provider]  lidocaine-prilocaine (EMLA) cream Apply 1 application topically as needed. Apply generously over the Mediport 45 minutes prior to chemotherapy. 07/31/17   Cammie Sickle, MD  omeprazole (PRILOSEC) 40 MG capsule daily as needed.  09/02/17   [provider]     Vital Signs: BP (!) 136/95   Pulse 65   Temp 98.3 F (36.8 C) (Oral)   Resp 18   Ht 5' 11"  (1.803 m)   Wt 208 lb 5.4 oz (94.5 kg)   SpO2 96%   BMI 29.06 kg/m   Physical Exam awake, alert.  Chest clear to auscultation bilaterally.  Clean, intact right chest wall Port-A-Cath.  Heart with regular rate and rhythm.  Abdomen soft, positive bowel sounds, nontender.  No lower extremity edema.  Imaging: No results found.  Labs:  CBC: Recent Labs    09/11/17 1006 09/22/17 0908 10/27/17 1325 01/12/18 0937  WBC 10.3 7.5 6.1 3.3*  HGB 13.1 12.5* 12.7* 14.2  HCT 39.0* 37.0* 37.0* 40.7  PLT 105* 208 164 131*    COAGS: Recent Labs    07/31/17 0806  INR 0.96    BMP: Recent Labs    09/11/17 1006 09/22/17 0908 11/03/17 0955 01/12/18 0937  NA 137 138 139 140  K 4.0 4.1 4.0 4.3  CL 102 106 106 108  CO2 28 24 25 25   GLUCOSE 136* 129* 160* 120*  BUN 15 18 13 17   CALCIUM 8.7* 8.9 8.7* 8.9    CREATININE 1.07 0.88 0.93 0.93  GFRNONAA >60 >60 >60 >60  GFRAA >60 >60 >60 >60    LIVER FUNCTION TESTS: Recent Labs    08/11/17 0843 09/01/17 0824 11/03/17 0955 01/12/18 0937  BILITOT 0.6 0.6 0.7 0.4  AST 25 23 24 29   ALT 23 23 19 28   ALKPHOS 65 66 58 55  PROT 7.4 6.9 7.0 7.4  ALBUMIN 4.1 3.7 4.1 4.3    Assessment and Plan: Patient is status post completion of treatment for diffuse large B-cell lymphoma of the stomach with recent PET scan showing no evidence of residual/recurrent disease.  He presents today for Port-A-Cath removal.  Details/risks of procedure, including but not limited to, internal bleeding, infection, injury to adjacent structures, discussed with patient with his understanding and consent.   Electronically Signed: D. Rowe Robert, PA-C 02/12/2018, 9:25 AM   I spent a total of 20 minutes at the the patient's bedside AND on the patient's hospital floor or unit, greater than 50% of which was counseling/coordinating care for port a cath removal

## 2018-02-12 NOTE — Procedures (Signed)
RIJV Port removal without difficulty  Complications:  None  Blood Loss: none  See dictation in canopy pacs

## 2018-03-15 DIAGNOSIS — G8929 Other chronic pain: Secondary | ICD-10-CM | POA: Diagnosis not present

## 2018-03-15 DIAGNOSIS — M545 Low back pain: Secondary | ICD-10-CM | POA: Diagnosis not present

## 2018-03-15 DIAGNOSIS — M5136 Other intervertebral disc degeneration, lumbar region: Secondary | ICD-10-CM | POA: Diagnosis not present

## 2018-03-25 DIAGNOSIS — R079 Chest pain, unspecified: Secondary | ICD-10-CM | POA: Diagnosis not present

## 2018-03-26 ENCOUNTER — Other Ambulatory Visit: Payer: Self-pay | Admitting: Family Medicine

## 2018-03-26 ENCOUNTER — Encounter: Payer: Self-pay | Admitting: Family Medicine

## 2018-03-26 ENCOUNTER — Ambulatory Visit (INDEPENDENT_AMBULATORY_CARE_PROVIDER_SITE_OTHER): Payer: Medicare Other | Admitting: Family Medicine

## 2018-03-26 VITALS — BP 126/70 | HR 63 | Temp 98.4°F | Resp 16 | Ht 70.0 in | Wt 214.0 lb

## 2018-03-26 DIAGNOSIS — E782 Mixed hyperlipidemia: Secondary | ICD-10-CM

## 2018-03-26 DIAGNOSIS — R351 Nocturia: Secondary | ICD-10-CM

## 2018-03-26 DIAGNOSIS — I10 Essential (primary) hypertension: Secondary | ICD-10-CM

## 2018-03-26 DIAGNOSIS — R7303 Prediabetes: Secondary | ICD-10-CM | POA: Diagnosis not present

## 2018-03-26 DIAGNOSIS — C8599 Non-Hodgkin lymphoma, unspecified, extranodal and solid organ sites: Secondary | ICD-10-CM | POA: Diagnosis not present

## 2018-03-26 DIAGNOSIS — E785 Hyperlipidemia, unspecified: Secondary | ICD-10-CM

## 2018-03-26 DIAGNOSIS — I2510 Atherosclerotic heart disease of native coronary artery without angina pectoris: Secondary | ICD-10-CM

## 2018-03-26 LAB — POCT GLYCOSYLATED HEMOGLOBIN (HGB A1C): Hemoglobin A1C: 6.1 — AB (ref ?–5.7)

## 2018-03-26 MED ORDER — CARVEDILOL 3.125 MG PO TABS: 3.1250 mg | ORAL_TABLET | Freq: Two times a day (BID) | ORAL | 3 refills | Status: DC

## 2018-03-26 MED ORDER — EZETIMIBE 10 MG PO TABS: 10.0000 mg | ORAL_TABLET | Freq: Every day | ORAL | 3 refills | Status: DC

## 2018-03-26 MED ORDER — LISINOPRIL 2.5 MG PO TABS: 2.5000 mg | ORAL_TABLET | Freq: Every day | ORAL | 3 refills | Status: DC

## 2018-03-26 MED ORDER — ROSUVASTATIN CALCIUM 5 MG PO TABS: 5.0000 mg | ORAL_TABLET | Freq: Every day | ORAL | 3 refills | Status: DC

## 2018-03-26 NOTE — Assessment & Plan Note (Signed)
Controlled cholesterol on statin and zetia. Some worsening lifestyle habits. Last lipid panel 06/2017 - remains improved ASCVD risk is elevated with known CAD s/p CABG  Plan: 1. Continue current meds - Rosuvastatin 5mg  - 3 times weekly or may increase to DAILY if he prefers now, Zetia 10mg  daily 2. Continue ASA 81mg  for secondary ASCVD risk reduction 3. Encourage improved lifestyle - low carb/cholesterol, reduce portion size, start regular exercise 4. Follow-up 4 months medicare check up and lipids

## 2018-03-26 NOTE — Assessment & Plan Note (Signed)
Controlled HTN - Home BP readings not available  Complicated by CAD    Plan:  1. Continue current BP regimen Coreg 3.125mg  BID, Lisinopril 2.5mg  2. Encourage improved lifestyle - low sodium diet, regular exercise 3. Continue monitor BP outside office, bring readings to next visit, if persistently >140/90 or new symptoms notify office sooner 4. Follow-up 4 months yearly checkup labs

## 2018-03-26 NOTE — Assessment & Plan Note (Signed)
Mild improved A1c to 6.1, from 6.2 to 6.3, despite weight gain and some poor diet. Improved off chemo Concern with HTN, HLD, CAD OFF Metformin  Plan:  1. Not on any therapy currently 2. Encourage improved lifestyle - resume low carb low starch diet, inc protein, continue improving exercise 3. Follow-up 4 months medicare check up and labs A1c

## 2018-03-26 NOTE — Assessment & Plan Note (Signed)
Stable currently in remission, without evidence of recurrence Followed by [facility] Dr Rogue Bussing, s/p Chemotherapy Last PET done 12/2017, negative

## 2018-03-26 NOTE — Progress Notes (Signed)
Subjective:    Patient ID: Corey Daniel, male    DOB: January 10, 1946, 72 y.o.   MRN: [id number]  Corey Daniel is a 72 y.o. male presenting on 03/26/2018 for Hypertension   HPI   Pre-Diabetes: Doing well, no concerns. He wants to avoid metformin in future, used to be on it few year ago CBGs: Not checking CBG Meds: None Currently on ACEi Lifestyle: - Weight gain 5-6 lbs in 3 months, he is not checking weights at home - Diet (Admits eating increased portions, otherwise tried to improve food choices in diet) - Exercise (Gradually improving regular exercise) Denies hypoglycemia, polyuria, visual changes, numbness or tingling.  CHRONIC HTN: Reportsno concerns with BP. Monitoring outside office Current Meds - Coreg 3.125mg  BID, Lisinopril 2.5mg  Reports good compliance, took meds today. Tolerating well, w/o complaints. Denies CP, dyspnea, HA, edema, dizziness / lightheadedness  HYPERLIPIDEMIA: - Reports that he thinks past muscle aches due to Rosuvastatin may not have been due to med, it could be general aches and pains as he feels about the same. Last lipid panel 06/2017, controlled  - Currently taking Rosuvastatin 5mg  3 times weekly - he has abundance of med at home, tolerating well  Chronic Low Back Pain, Lumbar DJD - Has been referred to [facility], seen Allene Dillon NP / Dr Sharlet Salina, 02/2018, he was referred to [facility], has not scheduled or contacted patient yet, they deferred MRI and injection at this current time.  Follow-up Lymphoma of fundus of stomach - remission Followed by [facility] Dr Rogue Bussing, last seen 12/2017, had PET Scan, overall reported good results, without evidence of activity. He continues to follow-up for surveillance.  Additional update: Followed by [facility] - Dr Ubaldo Glassing, he had stress test done yesterday by his report, awaiting results. Last done 2018 and report was normal   Depression screen [facility] 2/9 03/26/2018 11/23/2017  10/06/2017  Decreased Interest 0 0 0  Down, Depressed, Hopeless 0 0 0  PHQ - 2 Score 0 0 0    Social History   Tobacco Use  . Smoking status: Former Smoker    Packs/day: 0.50    Years: 30.00    Pack years: 15.00    Types: Cigarettes    Last attempt to quit: 1983    Years since quitting: 36.3  . Smokeless tobacco: Never Used  Substance Use Topics  . Alcohol use: No  . Drug use: No    Review of Systems Per HPI unless specifically indicated above     Objective:    BP 126/70   Pulse 63   Temp 98.4 F (36.9 C) (Oral)   Resp 16   Ht 5\' 10"  (1.778 m)   Wt 214 lb (97.1 kg)   BMI 30.71 kg/m   Wt Readings from Last 3 Encounters:  03/26/18 214 lb (97.1 kg)  02/12/18 208 lb 5.4 oz (94.5 kg)  01/12/18 209 lb (94.8 kg)    Physical Exam  Constitutional: He is oriented to person, place, and time. He appears well-developed and well-nourished. No distress.  Well-appearing, comfortable, cooperative  HENT:  Head: Normocephalic and atraumatic.  Mouth/Throat: Oropharynx is clear and moist.  Eyes: Conjunctivae are normal. Right eye exhibits no discharge. Left eye exhibits no discharge.  Cardiovascular: Normal rate.  Pulmonary/Chest: Effort normal.  Musculoskeletal: He exhibits no edema.  Neurological: He is alert and oriented to person, place, and time.  Skin: Skin is warm and dry. No rash noted. He is not diaphoretic. No erythema.  Psychiatric: He has a normal mood and affect. His behavior is normal.  Well groomed, good eye contact, normal speech and thoughts  Nursing note and vitals reviewed.  Results for orders placed or performed in visit on 03/26/18  POCT HgB A1C  Result Value Ref Range   Hemoglobin A1C 6.1 (A) 5.7   Recent Labs    07/17/17 0818 11/23/17 0822 03/26/18 0829  HGBA1C 6.2* 6.3* 6.1*       Assessment & Plan:   Problem List Items Addressed This Visit    Essential hypertension    Controlled HTN - Home BP readings not available  Complicated by  CAD    Plan:  1. Continue current BP regimen Coreg 3.125mg  BID, Lisinopril 2.5mg  2. Encourage improved lifestyle - low sodium diet, regular exercise 3. Continue monitor BP outside office, bring readings to next visit, if persistently >140/90 or new symptoms notify office sooner 4. Follow-up 4 months yearly checkup labs      HLD (hyperlipidemia)    Controlled cholesterol on statin and zetia. Some worsening lifestyle habits. Last lipid panel 06/2017 - remains improved ASCVD risk is elevated with known CAD s/p CABG  Plan: 1. Continue current meds - Rosuvastatin 5mg  - 3 times weekly or may increase to DAILY if he prefers now, Zetia 10mg  daily 2. Continue ASA 81mg  for secondary ASCVD risk reduction 3. Encourage improved lifestyle - low carb/cholesterol, reduce portion size, start regular exercise 4. Follow-up 4 months medicare check up and lipids      Lymphoma of fundus of stomach (HCC)    Stable currently in remission, without evidence of recurrence Followed by [facility] Dr Rogue Bussing, s/p Chemotherapy Last PET done 12/2017, negative      Pre-diabetes - Primary    Mild improved A1c to 6.1, from 6.2 to 6.3, despite weight gain and some poor diet. Improved off chemo Concern with HTN, HLD, CAD OFF Metformin  Plan:  1. Not on any therapy currently 2. Encourage improved lifestyle - resume low carb low starch diet, inc protein, continue improving exercise 3. Follow-up 4 months medicare check up and labs A1c      Relevant Orders   POCT HgB A1C (Completed)      No orders of the defined types were placed in this encounter.   Follow up plan: Return in about 4 months (around 07/27/2018) for Yearly Medicare Checkup.  Future labs ordered for 07/23/18  [name], [facility] 03/26/2018, 1:17 PM

## 2018-03-26 NOTE — Telephone Encounter (Signed)
Pt has no refills left after he finishes what he has now.

## 2018-03-26 NOTE — Patient Instructions (Addendum)
Thank you for coming to the office today.  A1c improved from 6.3 to 6.1 - keep trying to improve - Try to limit portion size and improve diet, low carb / starches - Continue walking on treadmill  No medicine needed  No refills today - call or send request from pharmacy when ready  May resume statin every day instead of 3 times weekly if you want  Call  [facility]  Address: [address] [place], [place] [address]  Phone: [phone]   DUE for FASTING BLOOD WORK (no food or drink after midnight before the lab appointment, only water or coffee without cream/sugar on the morning of)  SCHEDULE "Lab Only" visit in the morning at the clinic for lab draw in 4 MONTHS   - Make sure Lab Only appointment is at about 1 week before your next appointment, so that results will be available  For Lab Results, once available within 2-3 days of blood draw, you can can log in to MyChart online to view your results and a brief explanation. Also, we can discuss results at next follow-up visit.   Please schedule a Follow-up Appointment to: Return in about 4 months (around 07/27/2018) for Yearly Medicare Checkup.  If you have any other questions or concerns, please feel free to call the office or send a message through [place]. You may also schedule an earlier appointment if necessary.  Additionally, you may be receiving a survey about your experience at our office within a few days to 1 week by e-mail or mail. We value your feedback.  [name], DO [place]

## 2018-04-09 DIAGNOSIS — I10 Essential (primary) hypertension: Secondary | ICD-10-CM | POA: Diagnosis not present

## 2018-04-09 DIAGNOSIS — I2510 Atherosclerotic heart disease of native coronary artery without angina pectoris: Secondary | ICD-10-CM | POA: Diagnosis not present

## 2018-04-09 DIAGNOSIS — E782 Mixed hyperlipidemia: Secondary | ICD-10-CM | POA: Diagnosis not present

## 2018-04-13 ENCOUNTER — Encounter: Payer: Self-pay | Admitting: Internal Medicine

## 2018-04-13 ENCOUNTER — Ambulatory Visit: Payer: Medicare Other | Admitting: Internal Medicine

## 2018-04-13 ENCOUNTER — Inpatient Hospital Stay: Payer: Medicare Other | Attending: Internal Medicine | Admitting: Internal Medicine

## 2018-04-13 ENCOUNTER — Other Ambulatory Visit: Payer: Medicare Other

## 2018-04-13 ENCOUNTER — Inpatient Hospital Stay: Payer: Medicare Other

## 2018-04-13 ENCOUNTER — Other Ambulatory Visit: Payer: Self-pay

## 2018-04-13 VITALS — BP 123/63 | HR 75 | Temp 97.8°F | Resp 18 | Ht 70.0 in | Wt 213.8 lb

## 2018-04-13 DIAGNOSIS — C8333 Diffuse large B-cell lymphoma, intra-abdominal lymph nodes: Secondary | ICD-10-CM | POA: Diagnosis not present

## 2018-04-13 DIAGNOSIS — I252 Old myocardial infarction: Secondary | ICD-10-CM | POA: Insufficient documentation

## 2018-04-13 DIAGNOSIS — D509 Iron deficiency anemia, unspecified: Secondary | ICD-10-CM

## 2018-04-13 DIAGNOSIS — Z79899 Other long term (current) drug therapy: Secondary | ICD-10-CM | POA: Diagnosis not present

## 2018-04-13 DIAGNOSIS — Z87891 Personal history of nicotine dependence: Secondary | ICD-10-CM | POA: Diagnosis not present

## 2018-04-13 DIAGNOSIS — I10 Essential (primary) hypertension: Secondary | ICD-10-CM

## 2018-04-13 DIAGNOSIS — E785 Hyperlipidemia, unspecified: Secondary | ICD-10-CM | POA: Diagnosis not present

## 2018-04-13 DIAGNOSIS — Z923 Personal history of irradiation: Secondary | ICD-10-CM | POA: Insufficient documentation

## 2018-04-13 DIAGNOSIS — Z7982 Long term (current) use of aspirin: Secondary | ICD-10-CM | POA: Diagnosis not present

## 2018-04-13 DIAGNOSIS — G8929 Other chronic pain: Secondary | ICD-10-CM | POA: Diagnosis not present

## 2018-04-13 DIAGNOSIS — M129 Arthropathy, unspecified: Secondary | ICD-10-CM | POA: Insufficient documentation

## 2018-04-13 DIAGNOSIS — I2510 Atherosclerotic heart disease of native coronary artery without angina pectoris: Secondary | ICD-10-CM | POA: Diagnosis not present

## 2018-04-13 DIAGNOSIS — C8599 Non-Hodgkin lymphoma, unspecified, extranodal and solid organ sites: Secondary | ICD-10-CM

## 2018-04-13 LAB — CBC WITH DIFFERENTIAL/PLATELET
BASOS ABS: 0.1 10*3/uL (ref 0–0.1)
Basophils Relative: 1 %
Eosinophils Absolute: 0.2 10*3/uL (ref 0–0.7)
Eosinophils Relative: 5 %
HCT: 41.4 % (ref 40.0–52.0)
HEMOGLOBIN: 14.2 g/dL (ref 13.0–18.0)
Lymphocytes Relative: 13 %
Lymphs Abs: 0.6 10*3/uL — ABNORMAL LOW (ref 1.0–3.6)
MCH: 30.9 pg (ref 26.0–34.0)
MCHC: 34.2 g/dL (ref 32.0–36.0)
MCV: 90.2 fL (ref 80.0–100.0)
MONO ABS: 0.5 10*3/uL (ref 0.2–1.0)
MONOS PCT: 10 %
NEUTROS ABS: 3.4 10*3/uL (ref 1.4–6.5)
Neutrophils Relative %: 71 %
PLATELETS: 137 10*3/uL — AB (ref 150–440)
RBC: 4.59 MIL/uL (ref 4.40–5.90)
RDW: 14.5 % (ref 11.5–14.5)
WBC: 4.7 10*3/uL (ref 3.8–10.6)

## 2018-04-13 LAB — COMPREHENSIVE METABOLIC PANEL
ALBUMIN: 4.3 g/dL (ref 3.5–5.0)
ALT: 25 U/L (ref 17–63)
AST: 26 U/L (ref 15–41)
Alkaline Phosphatase: 61 U/L (ref 38–126)
Anion gap: 9 (ref 5–15)
BUN: 19 mg/dL (ref 6–20)
CHLORIDE: 106 mmol/L (ref 101–111)
CO2: 23 mmol/L (ref 22–32)
Calcium: 9.1 mg/dL (ref 8.9–10.3)
Creatinine, Ser: 1.08 mg/dL (ref 0.61–1.24)
GFR calc Af Amer: >60 mL/min (ref >60)
GFR calc non Af Amer: >60 mL/min (ref >60)
GLUCOSE: 191 mg/dL — AB (ref 65–99)
POTASSIUM: 4 mmol/L (ref 3.5–5.1)
Sodium: 138 mmol/L (ref 135–145)
Total Bilirubin: 0.9 mg/dL (ref 0.3–1.2)
Total Protein: 7.3 g/dL (ref 6.5–8.1)

## 2018-04-13 NOTE — Assessment & Plan Note (Addendum)
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  Status post chemotherapy followed by radiation; February 2019 PET scan CR; upper endoscopy March 2019 normal as per patient.  Stable/improved.  Continue surveillance  #Platelets 140s asymptomatic; continue to monitor at this time.  #Chronic back pain; slightly worsened; status post orthopedic evaluation.  Defer to orthopedics.  # follow up in 6 months/labs; patient states he will have his GI doctor send a copy of the EGD report to [place].

## 2018-04-13 NOTE — Progress Notes (Signed)
[facility] OFFICE PROGRESS NOTE  Patient Care Team: Olin Hauser, DO as PCP - General (Family Medicine)  Cancer Staging No matching staging information was found for the patient.   Oncology History   # AUG 2018- DIFFUSE LARGE B CELL LYMPHOMA STOMACH-STAGE IE [BMBx-NEG]Fundus ;CD-20 pos;variable- bcl6/mum-?? GCB vs. ABC subtype;  [EGD for IDA; Dr.Hung; [place]]- NEG for FISH for;myc; bcl-6; bcl-2;11:14; MALT [8q21];NEG for CD-10; ki-67-70%; SEP PET- uptake in stomach. Hepatitis panel-NEG; BMBx-NEG  # SEP 25th 2018- R-CHOP x3 ; RT [until jan 3rd 2019]; PET Feb 2019- CR; s/p EGD- NEG [March 2019]  # AUG 2018- H.pylori positive/stomach body.   # IDA-resolved  # CAD [s/p CABG; Dr.fath];MUGA scan [sep 2018]- 56.5%; port explanted.  ---------------------------------------------------------------   DIAGNOSIS: [ aug 2018]- DLBCL OD STOMACH  STAGE: IE ; GOALS: curative  CURRENT/MOST RECENT THERAPY;  surveillance      Lymphoma of fundus of stomach ([place])      INTERVAL HISTORY:  Corey Daniel 72 y.o.  male pleasant patient above history of diffuse large B-cell lymphoma of the stomach is here for follow-up.  Patient interim had a upper endoscopy in March 2019-as per patient was negative.  Denies abdominal distention but denies abdominal pain.  No nausea no vomiting.  Patient has chronic back pain which has steadily gotten worse.  He has been evaluated [facility] recommend physical therapy.  Patient has not had physical therapy yet.  Review of Systems  Constitutional: Negative for chills, diaphoresis, fever, malaise/fatigue and weight loss.  HENT: Negative for nosebleeds and sore throat.   Eyes: Negative for double vision.  Respiratory: Negative for cough, hemoptysis, sputum production, shortness of breath and wheezing.   Cardiovascular: Negative for chest pain, palpitations, orthopnea and leg swelling.  Gastrointestinal: Negative for  abdominal pain, blood in stool, constipation, diarrhea, heartburn, melena, nausea and vomiting.  Genitourinary: Negative for dysuria, frequency and urgency.  Musculoskeletal: Positive for back pain. Negative for joint pain.  Skin: Negative.  Negative for itching and rash.  Neurological: Negative for dizziness, tingling, focal weakness, weakness and headaches.  Endo/Heme/Allergies: Does not bruise/bleed easily.  Psychiatric/Behavioral: Negative for depression. The patient is not nervous/anxious and does not have insomnia.       PAST MEDICAL HISTORY :  Past Medical History:  Diagnosis Date  . Arthritis   . ASCVD (arteriosclerotic cardiovascular disease)   . Back pain    with radiation  . Cancer ([place])    lymphoma  . Coronary artery disease   . Hyperlipidemia   . Hypertension   . IDA (iron deficiency anemia)   . Lower leg pain   . Myocardial infarction [facility])     PAST SURGICAL HISTORY :   Past Surgical History:  Procedure Laterality Date  . AUTOGRAFT STRUCTURAL ICBG OBTAINED SEPARATE INCISION FOR SPINE SURGERY   Bilateral 07/15/2013  . BACK SURGERY    . CATARACT EXTRACTION  2010  . COLONOSCOPY  06/2017  . CORONARY ARTERY BYPASS GRAFT    . IR FLUORO GUIDE PORT INSERTION LEFT  07/31/2017  . IR REMOVAL TUN ACCESS W/ PORT W/O FL MOD SED  02/12/2018  . LAMINECTOMY LUMBAR EXCISON/EVACUATION EXTRADURAL INTRASPINAL LESION Bilateral 07/15/2013     . LAMINOTOMY REEXPLORATION POSTERIOR LUMBAR W/NERVE DECOMP & DISCECTOMY Bilateral 07/15/2013     . UPPER GI ENDOSCOPY  06/2017    FAMILY HISTORY :   Family History  Problem Relation Age of Onset  . Heart disease Mother   . Coarctation of the  aorta Brother     SOCIAL HISTORY:   Social History   Tobacco Use  . Smoking status: Former Smoker    Packs/day: 0.50    Years: 30.00    Pack years: 15.00    Types: Cigarettes    Last attempt to quit: 1983    Years since quitting: 36.4  . Smokeless tobacco: Never Used  Substance Use Topics  .  Alcohol use: No  . Drug use: No    ALLERGIES:  has No Known Allergies.  MEDICATIONS:  Current Outpatient Medications  Medication Sig Dispense Refill  . aspirin 81 MG chewable tablet Chew 81 mg by mouth daily.     . carvedilol (COREG) 3.125 MG tablet Take 1 tablet (3.125 mg total) by mouth 2 (two) times daily with a meal. 180 tablet 3  . Cholecalciferol (VITAMIN D3) 2000 UNITS capsule Take 2,000 Units by mouth daily.     . Coenzyme Q10 100 MG capsule Take by mouth.    . ezetimibe (ZETIA) 10 MG tablet Take 1 tablet (10 mg total) by mouth at bedtime. 90 tablet 3  . Fish Oil-Cholecalciferol (FISH OIL + D3) 1000-1000 MG-UNIT CAPS Take by mouth.    . fluticasone (FLONASE) 50 MCG/ACT nasal spray Place into the nose.    Marland Kitchen lisinopril (ZESTRIL) 2.5 MG tablet Take 1 tablet (2.5 mg total) by mouth daily. 90 tablet 3  . omeprazole (PRILOSEC) 40 MG capsule daily as needed.     . rosuvastatin (CRESTOR) 5 MG tablet Take 1 tablet (5 mg total) by mouth at bedtime. 90 tablet 3   No current facility-administered medications for this visit.     PHYSICAL EXAMINATION: ECOG PERFORMANCE STATUS: 0 - Asymptomatic  BP 123/63   Pulse 75   Temp 97.8 F (36.6 C) (Oral)   Resp 18   Ht 5' 10"  (1.778 m)   Wt 213 lb 13.5 oz (97 kg)   BMI 30.68 kg/m   Filed Weights   04/13/18 1353  Weight: 213 lb 13.5 oz (97 kg)    GENERAL: Well-nourished well-developed; Alert, no distress and comfortable.  He is alone.  EYES: no pallor or icterus OROPHARYNX: no thrush or ulceration; NECK: supple; no lymph nodes felt. LYMPH:  no palpable lymphadenopathy in the axillary or inguinal regions LUNGS: Decreased breath sounds auscultation bilaterally. No wheeze or crackles HEART/CVS: regular rate & rhythm and no murmurs; No lower extremity edema ABDOMEN:abdomen soft, non-tender and normal bowel sounds. No hepatomegaly or splenomegaly.  Musculoskeletal:no cyanosis of digits and no clubbing  PSYCH: alert & oriented x 3 with  fluent speech NEURO: no focal motor/sensory deficits SKIN:  no rashes or significant lesions    LABORATORY DATA:  I have reviewed the data as listed    Component Value Date/Time   NA 138 04/13/2018 1336   NA 139 02/06/2016 0806   K 4.0 04/13/2018 1336   CL 106 04/13/2018 1336   CO2 23 04/13/2018 1336   GLUCOSE 191 (H) 04/13/2018 1336   BUN 19 04/13/2018 1336   BUN 18 02/06/2016 0806   CREATININE 1.08 04/13/2018 1336   CREATININE 1.20 (H) 07/17/2017 0818   CALCIUM 9.1 04/13/2018 1336   PROT 7.3 04/13/2018 1336   PROT 7.1 02/06/2016 0806   ALBUMIN 4.3 04/13/2018 1336   ALBUMIN 4.7 02/06/2016 0806   AST 26 04/13/2018 1336   ALT 25 04/13/2018 1336   ALKPHOS 61 04/13/2018 1336   BILITOT 0.9 04/13/2018 1336   BILITOT 0.5 02/06/2016 0806   GFRNONAA >60  04/13/2018 1336   GFRNONAA 60 07/17/2017 0818   GFRAA >60 04/13/2018 1336   GFRAA 70 07/17/2017 0818    No results found for: SPEP, UPEP  Lab Results  Component Value Date   WBC 4.7 04/13/2018   NEUTROABS 3.4 04/13/2018   HGB 14.2 04/13/2018   HCT 41.4 04/13/2018   MCV 90.2 04/13/2018   PLT 137 (L) 04/13/2018      Chemistry      Component Value Date/Time   NA 138 04/13/2018 1336   NA 139 02/06/2016 0806   K 4.0 04/13/2018 1336   CL 106 04/13/2018 1336   CO2 23 04/13/2018 1336   BUN 19 04/13/2018 1336   BUN 18 02/06/2016 0806   CREATININE 1.08 04/13/2018 1336   CREATININE 1.20 (H) 07/17/2017 0818      Component Value Date/Time   CALCIUM 9.1 04/13/2018 1336   ALKPHOS 61 04/13/2018 1336   AST 26 04/13/2018 1336   ALT 25 04/13/2018 1336   BILITOT 0.9 04/13/2018 1336   BILITOT 0.5 02/06/2016 0806       RADIOGRAPHIC STUDIES: I have personally reviewed the radiological images as listed and agreed with the findings in the report. No results found.   ASSESSMENT & PLAN:  Lymphoma of fundus of stomach ([place]) #Diffuse large B-cell lymphoma of the stomach; stage I E.  Status post chemotherapy followed by  radiation; February 2019 PET scan CR; upper endoscopy March 2019 normal as per patient.  Stable/improved.  Continue surveillance  #Platelets 140s asymptomatic; continue to monitor at this time.  #Chronic back pain; slightly worsened; status post orthopedic evaluation.  Defer to orthopedics.  # follow up in 6 months/labs; patient states he will have his GI doctor send a copy of the EGD report to [place].     Orders Placed This Encounter  Procedures  . CBC with Differential/Platelet    Standing Status:   Future    Standing Expiration Date:   04/14/2019  . Comprehensive metabolic panel    Standing Status:   Future    Standing Expiration Date:   04/14/2019  . Lactate dehydrogenase    Standing Status:   Future    Standing Expiration Date:   04/13/2019   All questions were answered. The patient knows to call the clinic with any problems, questions or concerns.      Cammie Sickle, MD 04/13/2018 2:28 PM

## 2018-05-27 ENCOUNTER — Encounter: Payer: Self-pay | Admitting: Gastroenterology

## 2018-07-08 ENCOUNTER — Ambulatory Visit
Admission: RE | Admit: 2018-07-08 | Discharge: 2018-07-08 | Disposition: A | Discharge status: Home / Self Care | Payer: Medicare Other | Source: Ambulatory Visit | Attending: Radiation Oncology | Admitting: Radiation Oncology

## 2018-07-08 ENCOUNTER — Other Ambulatory Visit: Payer: Self-pay

## 2018-07-08 ENCOUNTER — Encounter: Payer: Self-pay | Admitting: Radiation Oncology

## 2018-07-08 DIAGNOSIS — Z923 Personal history of irradiation: Secondary | ICD-10-CM | POA: Insufficient documentation

## 2018-07-08 DIAGNOSIS — Z8572 Personal history of non-Hodgkin lymphomas: Secondary | ICD-10-CM | POA: Diagnosis not present

## 2018-07-08 DIAGNOSIS — C8599 Non-Hodgkin lymphoma, unspecified, extranodal and solid organ sites: Secondary | ICD-10-CM

## 2018-07-08 NOTE — Progress Notes (Signed)
Radiation Oncology Follow up Note  Name: Corey Daniel   Date:   07/08/2018 MRN:  [id number] DOB: 08-18-1946    This 72 y.o. male presents to the clinic today for even-month follow-up status post involved field radiation therapy for large B-cell lymphoma of the stomach stage IA.  REFERRING PROVIDER: [name] *  HPI: patient is a 72 year old male status post R CHOP 3 and then involved field radiation therapy for diffuse large B-cell lymphoma the stomach stage IE..he is seen today in routine follow up is doing well. His weight is stable. He's having no specific symptoms no dysphagia or abdominal pain. He had a PET CT scan which I have reviewed back in February showing no evidence of disease. Upper endoscopy in March 2019 was normal.  COMPLICATIONS OF TREATMENT: none  FOLLOW UP COMPLIANCE: keeps appointments   PHYSICAL EXAM:  BP (P) 132/80 (BP Location: Left Arm, Patient Position: Sitting)   Pulse (P) 70   Temp (!) (P) 95.7 F (35.4 C) (Tympanic)   Resp (P) 18   Wt (P) 219 lb 5.7 oz (99.5 kg)   BMI (P) 31.47 kg/m  Well-developed well-nourished patient in NAD. HEENT reveals PERLA, EOMI, discs not visualized.  Oral cavity is clear. No oral mucosal lesions are identified. Neck is clear without evidence of cervical or supraclavicular adenopathy. Lungs are clear to A&P. Cardiac examination is essentially unremarkable with regular rate and rhythm without murmur rub or thrill. Abdomen is benign with no organomegaly or masses noted. Motor sensory and DTR levels are equal and symmetric in the upper and lower extremities. Cranial nerves II through XII are grossly intact. Proprioception is intact. No peripheral adenopathy or edema is identified. No motor or sensory levels are noted. Crude visual fields are within normal range.  RADIOLOGY RESULTS: PET CT scan reviewed and compatible with the above-stated findings  PLAN: present time patient is doing well with no evidence of disease. I'm  please was overall progress. I've asked to see him back in 1 year for follow-up.He continues close follow-up care with medical oncology. Patient knows to call at anytime with any concerns.  I would like to take this opportunity to thank you for allowing me to participate in the care of your patient.Noreene Filbert, MD

## 2018-07-15 DIAGNOSIS — M5136 Other intervertebral disc degeneration, lumbar region: Secondary | ICD-10-CM | POA: Diagnosis not present

## 2018-07-15 DIAGNOSIS — Z981 Arthrodesis status: Secondary | ICD-10-CM | POA: Diagnosis not present

## 2018-07-15 DIAGNOSIS — M48062 Spinal stenosis, lumbar region with neurogenic claudication: Secondary | ICD-10-CM | POA: Diagnosis not present

## 2018-07-23 ENCOUNTER — Other Ambulatory Visit: Payer: Medicare Other

## 2018-07-23 DIAGNOSIS — E782 Mixed hyperlipidemia: Secondary | ICD-10-CM | POA: Diagnosis not present

## 2018-07-23 DIAGNOSIS — C8599 Non-Hodgkin lymphoma, unspecified, extranodal and solid organ sites: Secondary | ICD-10-CM | POA: Diagnosis not present

## 2018-07-23 DIAGNOSIS — R7303 Prediabetes: Secondary | ICD-10-CM

## 2018-07-23 DIAGNOSIS — I10 Essential (primary) hypertension: Secondary | ICD-10-CM | POA: Diagnosis not present

## 2018-07-23 DIAGNOSIS — R351 Nocturia: Secondary | ICD-10-CM

## 2018-07-26 LAB — LIPID PANEL
CHOL/HDL RATIO: 5.5 (calc) — AB (ref <5.0)
CHOLESTEROL: 227 mg/dL — AB (ref <200)
HDL: 41 mg/dL (ref >40)
LDL Cholesterol (Calc): 150 mg/dL (calc) — ABNORMAL HIGH
NON-HDL CHOLESTEROL (CALC): 186 mg/dL — AB (ref <130)
TRIGLYCERIDES: 222 mg/dL — AB (ref <150)

## 2018-07-26 LAB — COMPLETE METABOLIC PANEL WITH GFR
AG RATIO: 1.8 (calc) (ref 1.0–2.5)
ALT: 23 U/L (ref 9–46)
AST: 20 U/L (ref 10–35)
Albumin: 4.3 g/dL (ref 3.6–5.1)
Alkaline phosphatase (APISO): 53 U/L (ref 40–115)
BILIRUBIN TOTAL: 0.5 mg/dL (ref 0.2–1.2)
BUN: 22 mg/dL (ref 7–25)
CHLORIDE: 107 mmol/L (ref 98–110)
CO2: 26 mmol/L (ref 20–32)
Calcium: 9.4 mg/dL (ref 8.6–10.3)
Creat: 1.07 mg/dL (ref 0.70–1.18)
GFR, Est African American: 80 mL/min/{1.73_m2} (ref >=60)
GFR, Est Non African American: 69 mL/min/{1.73_m2} (ref >=60)
GLOBULIN: 2.4 g/dL (ref 1.9–3.7)
Glucose, Bld: 131 mg/dL — ABNORMAL HIGH (ref 65–99)
POTASSIUM: 4.5 mmol/L (ref 3.5–5.3)
SODIUM: 140 mmol/L (ref 135–146)
Total Protein: 6.7 g/dL (ref 6.1–8.1)

## 2018-07-26 LAB — CBC WITH DIFFERENTIAL/PLATELET
BASOS ABS: 48 {cells}/uL (ref 0–200)
Basophils Relative: 1.3 %
EOS ABS: 167 {cells}/uL (ref 15–500)
EOS PCT: 4.5 %
HCT: 40 % (ref 38.5–50.0)
HEMOGLOBIN: 13.5 g/dL (ref 13.2–17.1)
Lymphs Abs: 662 cells/uL — ABNORMAL LOW (ref 850–3900)
MCH: 31.3 pg (ref 27.0–33.0)
MCHC: 33.8 g/dL (ref 32.0–36.0)
MCV: 92.8 fL (ref 80.0–100.0)
MONOS PCT: 10.7 %
MPV: 10.3 fL (ref 7.5–12.5)
NEUTROS ABS: 2427 {cells}/uL (ref 1500–7800)
NEUTROS PCT: 65.6 %
Platelets: 147 10*3/uL (ref 140–400)
RBC: 4.31 10*6/uL (ref 4.20–5.80)
RDW: 13.8 % (ref 11.0–15.0)
Total Lymphocyte: 17.9 %
WBC mixed population: 396 cells/uL (ref 200–950)
WBC: 3.7 10*3/uL — ABNORMAL LOW (ref 3.8–10.8)

## 2018-07-26 LAB — PSA, TOTAL WITH REFLEX TO PSA, FREE: PSA, TOTAL: 1.4 ng/mL (ref <=4.0)

## 2018-07-26 LAB — HEMOGLOBIN A1C
EAG (MMOL/L): 8.2 (calc)
Hgb A1c MFr Bld: 6.8 % of total Hgb — ABNORMAL HIGH (ref <5.7)
MEAN PLASMA GLUCOSE: 148 (calc)

## 2018-07-28 ENCOUNTER — Ambulatory Visit (INDEPENDENT_AMBULATORY_CARE_PROVIDER_SITE_OTHER): Payer: Medicare Other | Admitting: Family Medicine

## 2018-07-28 ENCOUNTER — Encounter: Payer: Self-pay | Admitting: Family Medicine

## 2018-07-28 VITALS — BP 104/73 | HR 64 | Temp 97.9°F | Resp 16 | Ht 70.0 in | Wt 219.0 lb

## 2018-07-28 DIAGNOSIS — R7303 Prediabetes: Secondary | ICD-10-CM | POA: Diagnosis not present

## 2018-07-28 DIAGNOSIS — E782 Mixed hyperlipidemia: Secondary | ICD-10-CM

## 2018-07-28 DIAGNOSIS — M5126 Other intervertebral disc displacement, lumbar region: Secondary | ICD-10-CM | POA: Diagnosis not present

## 2018-07-28 DIAGNOSIS — M48061 Spinal stenosis, lumbar region without neurogenic claudication: Secondary | ICD-10-CM | POA: Diagnosis not present

## 2018-07-28 DIAGNOSIS — M5417 Radiculopathy, lumbosacral region: Secondary | ICD-10-CM

## 2018-07-28 DIAGNOSIS — M4316 Spondylolisthesis, lumbar region: Secondary | ICD-10-CM | POA: Diagnosis not present

## 2018-07-28 DIAGNOSIS — I10 Essential (primary) hypertension: Secondary | ICD-10-CM

## 2018-07-28 DIAGNOSIS — M5416 Radiculopathy, lumbar region: Secondary | ICD-10-CM | POA: Diagnosis not present

## 2018-07-28 DIAGNOSIS — M5137 Other intervertebral disc degeneration, lumbosacral region: Secondary | ICD-10-CM

## 2018-07-28 DIAGNOSIS — Z981 Arthrodesis status: Secondary | ICD-10-CM | POA: Diagnosis not present

## 2018-07-28 DIAGNOSIS — M5414 Radiculopathy, thoracic region: Secondary | ICD-10-CM

## 2018-07-28 DIAGNOSIS — M545 Low back pain: Secondary | ICD-10-CM | POA: Diagnosis not present

## 2018-07-28 DIAGNOSIS — Z85028 Personal history of other malignant neoplasm of stomach: Secondary | ICD-10-CM | POA: Diagnosis not present

## 2018-07-28 MED ORDER — METFORMIN HCL 500 MG PO TABS
500.0000 mg | ORAL_TABLET | Freq: Two times a day (BID) | ORAL | 1 refills | Status: DC
Start: 2018-07-28 — End: 2018-10-27

## 2018-07-28 NOTE — Assessment & Plan Note (Addendum)
Worsening A1c control from 6.1 up to 6.8, poor dietary Concern with HTN, HLD, CAD  Plan:  1. Restart metformin 500mg  nightly then inc gradually to BID as tolerated 2. Encourage improved lifestyle - resume low carb low starch diet, inc protein, continue improving exercise - handout given on low carb / glycemic index diet, he was not familiar with carbs = sugar 3. Follow-up 3 months PreDM A1c - if repeated reading >6.5 consider new dx DM2

## 2018-07-28 NOTE — Patient Instructions (Addendum)
Thank you for coming to the office today.  Wait until next visit OR Please schedule and return for a NURSE ONLY VISIT for VACCINE - Approximately around October 2019 - Need High Dose Flu Vaccine  Start Metformin 500mg  nightly with dinner for first few week - if upset stomach then continue on once daily for longer, once adjusted can increase to TWICE a day with food.  Recommend to start taking Tylenol Extra Strength 500mg  tabs - take 1 to 2 tabs per dose (max 1000mg ) every 6-8 hours for pain (take regularly, don't skip a dose for next 7 days), max 24 hour daily dose is 6 tablets or 3000mg . In the future you can repeat the same everyday Tylenol course for 1-2 weeks at a time.   In future we can consider other pain medicines like Gabapentin, sometimes Tramadol.  Restart Rosuvastatin 5mg  up to 3 times a week.  Please schedule a Follow-up Appointment to: Return in about 3 months (around 10/27/2018) for PreDM A1c, HTN, HLD.  If you have any other questions or concerns, please feel free to call the office or send a message through [place]. You may also schedule an earlier appointment if necessary.  Additionally, you may be receiving a survey about your experience at our office within a few days to 1 week by e-mail or mail. We value your feedback.  [name], DO [place]

## 2018-07-28 NOTE — Assessment & Plan Note (Signed)
Now uncontrolled cholesterol zetia fish oil, off statin due to intolerance Last lipid panel 07/2018 ASCVD risk is elevated with known CAD s/p CABG  Plan: 1. Restart Rosuvastatin 5mg  - 3 times weekly or scale back if need - Zetia 10mg  daily, Fish Oil 2. Continue ASA 81mg  for secondary ASCVD risk reduction 3. Encourage improved lifestyle - low carb/cholesterol, reduce portion size, start regular exercise 4. Follow-up lifestyle in 3 months - next lipid 6-12 mo

## 2018-07-28 NOTE — Progress Notes (Signed)
Subjective:    Patient ID: Corey Daniel, male    DOB: Jul 29, 1946, 72 y.o.   MRN: [id number]  Corey Daniel is a 72 y.o. male presenting on 07/28/2018 for Pre-Diabetes (follow up lab result) and Hyperlipidemia   HPI  Pre-Diabetes: Lab recently shows A1c inc to 6.8, from 6.1 CBGs: Not checking CBG Meds: Never on meds. Currently on ACEi Lifestyle: - Weight up 4-5 lbs in past 3-4 months, now stable in past 1 month - Diet (Not following low carb diet, will improve) - Exercise (Improving exercise on electric bicycle, does more outdoor activity) Denies hypoglycemia, polyuria, visual changes, numbness or tingling.  CHRONIC HTN: Reportsno concerns with BP. Monitoring outside office Current Meds - Coreg 3.125mg  BID, Lisinopril 2.5mg  Reports good compliance, took meds today. Tolerating well, w/o complaints. Denies CP, dyspnea, HA, edema, dizziness / lightheadedness  HYPERLIPIDEMIA: Previously taking Rosuvastatin 5mg  3 times week had some muscle aches and then he reduced down to 2 times a week, he has stopped now. Elevated lipids on recent lab, uncontrolled. - Taking Zetia daily and Fish Oil  Chronic Low Back Pain, Lumbar DJD Patient update today he states has Lumbar MRI scheduled later today and he will follow-up with his [place] T Loreen Freud, MD. He will follow-up with him after in follow-up. In past had prior back surgery. - Previously seen [facility]. - Taking Acetaminophen 500mg  up to 4 a few times a day - Asking about Tramadol and Gabapentin  Follow-up Lymphoma of fundus of stomach - remission Followed by [facility] Dr Rogue Bussing, last seen 03/2018 and 06/2018. He continues to follow-up for surveillance.  Health Maintenance: Due for High Dose Flu Shot, will return when in stock  Depression screen [facility] 2/9 07/28/2018 03/26/2018 11/23/2017  Decreased Interest 0 0 0  Down, Depressed, Hopeless 0 0 0  PHQ - 2 Score 0 0 0    Past Medical  History:  Diagnosis Date  . Arthritis   . ASCVD (arteriosclerotic cardiovascular disease)   . Back pain    with radiation  . Cancer ([place])    lymphoma  . Coronary artery disease   . Hyperlipidemia   . IDA (iron deficiency anemia)   . Lower leg pain   . Myocardial infarction [facility])    Past Surgical History:  Procedure Laterality Date  . AUTOGRAFT STRUCTURAL ICBG OBTAINED SEPARATE INCISION FOR SPINE SURGERY   Bilateral 07/15/2013  . BACK SURGERY    . CATARACT EXTRACTION  2010  . COLONOSCOPY  06/2017  . CORONARY ARTERY BYPASS GRAFT    . IR IMAGING GUIDED PORT INSERTION  07/31/2017  . IR REMOVAL TUN ACCESS W/ PORT W/O FL MOD SED  02/12/2018  . LAMINECTOMY LUMBAR EXCISON/EVACUATION EXTRADURAL INTRASPINAL LESION Bilateral 07/15/2013     . LAMINOTOMY REEXPLORATION POSTERIOR LUMBAR W/NERVE DECOMP & DISCECTOMY Bilateral 07/15/2013     . UPPER GI ENDOSCOPY  06/2017   Social History   Socioeconomic History  . Marital status: Married    Spouse name: Not on file  . Number of children: Not on file  . Years of education: Not on file  . Highest education level: Not on file  Occupational History  . Not on file  Social Needs  . Financial resource strain: Not hard at all  . Food insecurity:    Worry: Never true    Inability: Never true  . Transportation needs:    Medical: No    Non-medical: No  Tobacco Use  . Smoking  status: Former Smoker    Packs/day: 0.50    Years: 30.00    Pack years: 15.00    Types: Cigarettes    Last attempt to quit: 1983    Years since quitting: 36.7  . Smokeless tobacco: Former Network engineer and Sexual Activity  . Alcohol use: No  . Drug use: No  . Sexual activity: Yes  Lifestyle  . Physical activity:    Days per week: 0 days    Minutes per session: 0 min  . Stress: Not at all  Relationships  . Social connections:    Talks on phone: Twice a week    Gets together: Once a week    Attends religious service: More than 4 times per year    Active member  of club or organization: No    Attends meetings of clubs or organizations: Never    Relationship status: Married  . Intimate partner violence:    Fear of current or ex partner: No    Emotionally abused: No    Physically abused: No    Forced sexual activity: No  Other Topics Concern  . Not on file  Social History Narrative  . Not on file   Family History  Problem Relation Age of Onset  . Heart disease Mother   . Coarctation of the aorta Brother    Current Outpatient Medications on File Prior to Visit  Medication Sig  . aspirin 81 MG chewable tablet Chew 81 mg by mouth daily.   . carvedilol (COREG) 3.125 MG tablet Take 1 tablet (3.125 mg total) by mouth 2 (two) times daily with a meal.  . Cholecalciferol (VITAMIN D3) 2000 UNITS capsule Take 2,000 Units by mouth daily.   . Coenzyme Q10 100 MG capsule Take by mouth.  . ezetimibe (ZETIA) 10 MG tablet Take 1 tablet (10 mg total) by mouth at bedtime.  . Fish Oil-Cholecalciferol (FISH OIL + D3) 1000-1000 MG-UNIT CAPS Take by mouth.  . fluticasone (FLONASE) 50 MCG/ACT nasal spray Place into the nose.  Marland Kitchen lisinopril (ZESTRIL) 2.5 MG tablet Take 1 tablet (2.5 mg total) by mouth daily.  Marland Kitchen omeprazole (PRILOSEC) 40 MG capsule daily as needed.   . rosuvastatin (CRESTOR) 5 MG tablet Take 1 tablet (5 mg total) by mouth 3 (three) times a week.   No current facility-administered medications on file prior to visit.     Review of Systems Per HPI unless specifically indicated above     Objective:    BP 104/73   Pulse 64   Temp 97.9 F (36.6 C) (Oral)   Resp 16   Ht 5\' 10"  (1.778 m)   Wt 219 lb (99.3 kg)   BMI 31.42 kg/m   Wt Readings from Last 3 Encounters:  07/28/18 219 lb (99.3 kg)  07/08/18 (P) 219 lb 5.7 oz (99.5 kg)  04/13/18 213 lb 13.5 oz (97 kg)    Physical Exam  Constitutional: He is oriented to person, place, and time. He appears well-developed and well-nourished. No distress.  Well-appearing, comfortable, cooperative    HENT:  Head: Normocephalic and atraumatic.  Mouth/Throat: Oropharynx is clear and moist.  Eyes: Conjunctivae are normal. Right eye exhibits no discharge. Left eye exhibits no discharge.  Neck: Normal range of motion. Neck supple. No thyromegaly present.  Cardiovascular: Normal rate, regular rhythm, normal heart sounds and intact distal pulses.  No murmur heard. Pulmonary/Chest: Effort normal and breath sounds normal. No respiratory distress. He has no wheezes. He has no rales.  Musculoskeletal:  Normal range of motion. He exhibits no edema.  Lymphadenopathy:    He has no cervical adenopathy.  Neurological: He is alert and oriented to person, place, and time.  Skin: Skin is warm and dry. No rash noted. He is not diaphoretic. No erythema.  Psychiatric: He has a normal mood and affect. His behavior is normal.  Well groomed, good eye contact, normal speech and thoughts  Nursing note and vitals reviewed.  Results for orders placed or performed in visit on 07/23/18  CBC with Differential/Platelet  Result Value Ref Range   WBC 3.7 (L) 3.8 - 10.8 Thousand/uL   RBC 4.31 4.20 - 5.80 Million/uL   Hemoglobin 13.5 13.2 - 17.1 g/dL   HCT 40.0 38.5 - 50.0 %   MCV 92.8 80.0 - 100.0 fL   MCH 31.3 27.0 - 33.0 pg   MCHC 33.8 32.0 - 36.0 g/dL   RDW 13.8 11.0 - 15.0 %   Platelets 147 140 - 400 Thousand/uL   MPV 10.3 7.5 - 12.5 fL   Neutro Abs 2,427 1,500 - 7,800 cells/uL   Lymphs Abs 662 (L) 850 - 3,900 cells/uL   WBC mixed population 396 200 - 950 cells/uL   Eosinophils Absolute 167 15 - 500 cells/uL   Basophils Absolute 48 0 - 200 cells/uL   Neutrophils Relative % 65.6 %   Total Lymphocyte 17.9 %   Monocytes Relative 10.7 %   Eosinophils Relative 4.5 %   Basophils Relative 1.3 %  PSA, Total with Reflex to PSA, Free  Result Value Ref Range   PSA, Total 1.4 < OR = 4.0 ng/mL  Lipid panel  Result Value Ref Range   Cholesterol 227 (H) <200 mg/dL   HDL 41 >40 mg/dL   Triglycerides 222 (H) <150  mg/dL   LDL Cholesterol (Calc) 150 (H) mg/dL (calc)   Total CHOL/HDL Ratio 5.5 (H) <5.0 (calc)   Non-HDL Cholesterol (Calc) 186 (H) <130 mg/dL (calc)  COMPLETE METABOLIC PANEL WITH GFR  Result Value Ref Range   Glucose, Bld 131 (H) 65 - 99 mg/dL   BUN 22 7 - 25 mg/dL   Creat 1.07 0.70 - 1.18 mg/dL   GFR, Est Non African American 69 > OR = 60 mL/min/1.46m2   GFR, Est African American 80 > OR = 60 mL/min/1.77m2   BUN/Creatinine Ratio NOT APPLICABLE 6 - 22 (calc)   Sodium 140 135 - 146 mmol/L   Potassium 4.5 3.5 - 5.3 mmol/L   Chloride 107 98 - 110 mmol/L   CO2 26 20 - 32 mmol/L   Calcium 9.4 8.6 - 10.3 mg/dL   Total Protein 6.7 6.1 - 8.1 g/dL   Albumin 4.3 3.6 - 5.1 g/dL   Globulin 2.4 1.9 - 3.7 g/dL (calc)   AG Ratio 1.8 1.0 - 2.5 (calc)   Total Bilirubin 0.5 0.2 - 1.2 mg/dL   Alkaline phosphatase (APISO) 53 40 - 115 U/L   AST 20 10 - 35 U/L   ALT 23 9 - 46 U/L  Hemoglobin A1c  Result Value Ref Range   Hgb A1c MFr Bld 6.8 (H) <5.7 % of total Hgb   Mean Plasma Glucose 148 (calc)   eAG (mmol/L) 8.2 (calc)      Assessment & Plan:   Problem List Items Addressed This Visit    DDD (degenerative disc disease), lumbosacral    Followed by Dr Velna Ochs (Spine Surgery, [facility]) Pending Lumbar MRI today, then f/u in 1 week Prior back surgery, consider potential treatment options  now - Agree with high dose Acetaminophen for now, and defer further management to spine specialist - can reconsider other med rx such as gabapentin, future may consider tramadol as he is inquiring about these options      Essential hypertension    Controlled HTN on low dose meds - Home BP readings not available  Complicated by CAD    Plan:  1. Continue current BP regimen Coreg 3.125mg  BID, Lisinopril 2.5mg  2. Encourage improved lifestyle - low sodium diet, regular exercise 3. Continue monitor BP outside office, bring readings to next visit, if persistently >140/90 or new symptoms notify office sooner       Relevant Medications   rosuvastatin (CRESTOR) 5 MG tablet   Mixed hyperlipidemia    Now uncontrolled cholesterol zetia fish oil, off statin due to intolerance Last lipid panel 07/2018 ASCVD risk is elevated with known CAD s/p CABG  Plan: 1. Restart Rosuvastatin 5mg  - 3 times weekly or scale back if need - Zetia 10mg  daily, Fish Oil 2. Continue ASA 81mg  for secondary ASCVD risk reduction 3. Encourage improved lifestyle - low carb/cholesterol, reduce portion size, start regular exercise 4. Follow-up lifestyle in 3 months - next lipid 6-12 mo      Relevant Medications   rosuvastatin (CRESTOR) 5 MG tablet   Pre-diabetes - Primary    Worsening A1c control from 6.1 up to 6.8, poor dietary Concern with HTN, HLD, CAD  Plan:  1. Restart metformin 500mg  nightly then inc gradually to BID as tolerated 2. Encourage improved lifestyle - resume low carb low starch diet, inc protein, continue improving exercise - handout given on low carb / glycemic index diet, he was not familiar with carbs = sugar 3. Follow-up 3 months PreDM A1c - if repeated reading >6.5 consider new dx DM2      Relevant Medications   metFORMIN (GLUCOPHAGE) 500 MG tablet   Thoracic and lumbosacral neuritis      Meds ordered this encounter  Medications  . metFORMIN (GLUCOPHAGE) 500 MG tablet    Sig: Take 1 tablet (500 mg total) by mouth 2 (two) times daily with a meal.    Dispense:  180 tablet    Refill:  1    Follow up plan: Return in about 3 months (around 10/27/2018) for PreDM A1c, HTN, HLD.  [name], [facility] 07/28/2018, 9:01 AM

## 2018-07-28 NOTE — Assessment & Plan Note (Signed)
Controlled HTN on low dose meds - Home BP readings not available  Complicated by CAD    Plan:  1. Continue current BP regimen Coreg 3.125mg  BID, Lisinopril 2.5mg  2. Encourage improved lifestyle - low sodium diet, regular exercise 3. Continue monitor BP outside office, bring readings to next visit, if persistently >140/90 or new symptoms notify office sooner

## 2018-07-28 NOTE — Assessment & Plan Note (Signed)
Followed by Dr Velna Ochs (Spine Surgery, [facility]) Pending Lumbar MRI today, then f/u in 1 week Prior back surgery, consider potential treatment options now - Agree with high dose Acetaminophen for now, and defer further management to spine specialist - can reconsider other med rx such as gabapentin, future may consider tramadol as he is inquiring about these options

## 2018-07-29 DIAGNOSIS — M48062 Spinal stenosis, lumbar region with neurogenic claudication: Secondary | ICD-10-CM | POA: Diagnosis not present

## 2018-07-29 DIAGNOSIS — Z981 Arthrodesis status: Secondary | ICD-10-CM | POA: Diagnosis not present

## 2018-07-29 DIAGNOSIS — M5136 Other intervertebral disc degeneration, lumbar region: Secondary | ICD-10-CM | POA: Diagnosis not present

## 2018-08-19 DIAGNOSIS — E119 Type 2 diabetes mellitus without complications: Secondary | ICD-10-CM | POA: Diagnosis not present

## 2018-08-19 DIAGNOSIS — I2510 Atherosclerotic heart disease of native coronary artery without angina pectoris: Secondary | ICD-10-CM | POA: Diagnosis not present

## 2018-08-19 DIAGNOSIS — I10 Essential (primary) hypertension: Secondary | ICD-10-CM | POA: Diagnosis not present

## 2018-09-03 ENCOUNTER — Encounter: Payer: Self-pay | Admitting: Family Medicine

## 2018-09-03 ENCOUNTER — Ambulatory Visit (INDEPENDENT_AMBULATORY_CARE_PROVIDER_SITE_OTHER): Payer: Medicare Other | Admitting: Family Medicine

## 2018-09-03 VITALS — BP 119/69 | HR 71 | Temp 98.4°F | Resp 16 | Ht 70.0 in | Wt 210.0 lb

## 2018-09-03 DIAGNOSIS — M5414 Radiculopathy, thoracic region: Secondary | ICD-10-CM

## 2018-09-03 DIAGNOSIS — M48061 Spinal stenosis, lumbar region without neurogenic claudication: Secondary | ICD-10-CM | POA: Diagnosis not present

## 2018-09-03 DIAGNOSIS — M5417 Radiculopathy, lumbosacral region: Secondary | ICD-10-CM | POA: Diagnosis not present

## 2018-09-03 DIAGNOSIS — Z01818 Encounter for other preprocedural examination: Secondary | ICD-10-CM

## 2018-09-03 NOTE — Patient Instructions (Addendum)
Thank you for coming to the office today.  You are medically cleared to proceed with your Spine Surgery as scheduled.  Only thing potentially missing is a Chest X-ray, but as we discussed, you do not have the risk factors to require this, and the surgeons are not requesting it as well. If you find out that you need one, call us back and we arrange and order this to be done.  Please schedule a Follow-up Appointment to: Return if symptoms worsen or fail to improve.  If you have any other questions or concerns, please feel free to call the office or send a message through [place]. You may also schedule an earlier appointment if necessary.  Additionally, you may be receiving a survey about your experience at our office within a few days to 1 week by e-mail or mail. We value your feedback.  [name], DO [place]

## 2018-09-03 NOTE — Progress Notes (Signed)
Subjective:    Patient ID: Corey Daniel, male    DOB: 1946-06-21, 72 y.o.   MRN: [id number]  Corey Daniel is a 72 y.o. male presenting on 09/03/2018 for Medical Clearance and Back Pain   HPI    PRE-OPERATIVE MEDICAL CLEARANCE Anticipated upcoming surgery - BILATERAL REEXPLORATION LUMBAR DECOMPRESSION, EXCISION SYNOVIAL FACET CYSTS/STABILIZATION, SPINAL FUSION L2-3, L5-S1. DECOMPRESSION, METAL REMOVAL L3-4, L4-5. LEFT ICBG.  by Dr Val Riles (at [facility]) - on 09/21/18 anticipated date. - Pre Op apt with labs scheduled 10/24  Patient is followed by Cardiology (Dr Ubaldo Glassing, at [facility]), and has already completed Pre-Operative CARDIAC Clearance on 08/19/18 see documentation. They are here for MEDICAL clearance today.  Last visit with me 07/28/18, for same problem, see prior notes for background information.  - Today patient reports persistent chronic low back pain. He has had recent MRI, see results copied below. He has followed up with [facility] Dr Velna Ochs. He continues on Acetaminophen and other management per Ortho/Neurosurgery.  Additional background, he has had 2 lumbar spinal fusion before, now will need 3rd procedure for additional vertebrae, above and below, will have fused L1-5.  Regarding surgical and anesthesia history: - Known history of several major surgeries in past including similar prior back surgery Lumbar spine and also open heart surgery, and has tolerated general anesthesia well without problem, complication or allergy. No family history of problem with anesthesia - Able to tolerate regular exercise up to >4 METs, walking up flight of stairs - Known CAD s/p CABG. Followed by Dr Ubaldo Glassing. - No known history of pulmonary disease. Quit smoking 1983, never diagnosed with COPD, Asthma, OSA - Denies exertional symptoms of chest pain or tightness, dyspnea, coughing, apnea, syncopal episodes, palpitations  Health Maintenance: Due for Flu Shot, declines today  despite counseling on benefits   Depression screen [facility] 2/9 09/03/2018 07/28/2018 03/26/2018  Decreased Interest 0 0 0  Down, Depressed, Hopeless 0 0 0  PHQ - 2 Score 0 0 0    Social History   Tobacco Use  . Smoking status: Former Smoker    Packs/day: 0.50    Years: 30.00    Pack years: 15.00    Types: Cigarettes    Last attempt to quit: 1983    Years since quitting: 36.8  . Smokeless tobacco: Former Network engineer Use Topics  . Alcohol use: No  . Drug use: No    Review of Systems Per HPI unless specifically indicated above     Objective:    BP 119/69   Pulse 71   Temp 98.4 F (36.9 C) (Oral)   Resp 16   Ht 5\' 10"  (1.778 m)   Wt 210 lb (95.3 kg)   BMI 30.13 kg/m   Wt Readings from Last 3 Encounters:  09/03/18 210 lb (95.3 kg)  07/28/18 219 lb (99.3 kg)  07/08/18 (P) 219 lb 5.7 oz (99.5 kg)    Physical Exam  Constitutional: He is oriented to person, place, and time. He appears well-developed and well-nourished. No distress.  Well-appearing, comfortable, cooperative  HENT:  Head: Normocephalic and atraumatic.  Mouth/Throat: Oropharynx is clear and moist.  Eyes: Conjunctivae are normal. Right eye exhibits no discharge. Left eye exhibits no discharge.  Neck: Normal range of motion. Neck supple. No thyromegaly present.  Cardiovascular: Normal rate, regular rhythm, normal heart sounds and intact distal pulses.  No murmur heard. Pulmonary/Chest: Effort normal and breath sounds normal. No respiratory distress. He has no wheezes. He has no  rales.  Musculoskeletal: He exhibits no edema.  Did not fully re-examine low back today given neurosurgery evaluation, imaging, and scheduled surgery.  Lymphadenopathy:    He has no cervical adenopathy.  Neurological: He is alert and oriented to person, place, and time.  Skin: Skin is warm and dry. No rash noted. He is not diaphoretic. No erythema.  Psychiatric: He has a normal mood and affect. His behavior is normal.  Well  groomed, good eye contact, normal speech and thoughts  Nursing note and vitals reviewed.    2019 October ECG 12-lead10/01/2018 [place] Component Name Value Ref Range  Vent Rate (bpm) 68   PR Interval (msec) 170   QRS Interval (msec) 90   QT Interval (msec) 400   QTc (msec) 425   Other Result Information  This result has an attachment that is not available.  Result Narrative  Normal sinus rhythm Low voltage QRS  When compared with ECG of 05-Mar-2017 10:38, Incomplete right bundle branch block is no longer present  Borderline criteria for Anterolateral infarct are now present I reviewed and concur with this report. Electronically signed IO:NGEX MD, KEN ([id number]) on 08/31/2018 1:07:12 PM       Chemistry      Component Value Date/Time   NA 140 07/23/2018 0826   NA 139 02/06/2016 0806   K 4.5 07/23/2018 0826   CL 107 07/23/2018 0826   CO2 26 07/23/2018 0826   BUN 22 07/23/2018 0826   BUN 18 02/06/2016 0806   CREATININE 1.07 07/23/2018 0826      Component Value Date/Time   CALCIUM 9.4 07/23/2018 0826   ALKPHOS 61 04/13/2018 1336   AST 20 07/23/2018 0826   ALT 23 07/23/2018 0826   BILITOT 0.5 07/23/2018 0826   BILITOT 0.5 02/06/2016 0806     CBC Latest Ref Rng & Units 07/23/2018 04/13/2018 02/12/2018  WBC 3.8 - 10.8 Thousand/uL 3.7(L) 4.7 4.8  Hemoglobin 13.2 - 17.1 g/dL 13.5 14.2 14.7  Hematocrit 38.5 - 50.0 % 40.0 41.4 43.8  Platelets 140 - 400 Thousand/uL 147 137(L) 143(L)   Recent Labs    11/23/17 0822 03/26/18 0829 07/23/18 0826  HGBA1C 6.3* 6.1* 6.8*    Results for orders placed or performed in visit on 07/23/18  CBC with Differential/Platelet  Result Value Ref Range   WBC 3.7 (L) 3.8 - 10.8 Thousand/uL   RBC 4.31 4.20 - 5.80 Million/uL   Hemoglobin 13.5 13.2 - 17.1 g/dL   HCT 40.0 38.5 - 50.0 %   MCV 92.8 80.0 - 100.0 fL   MCH 31.3 27.0 - 33.0 pg   MCHC 33.8 32.0 - 36.0 g/dL   RDW 13.8 11.0 - 15.0 %   Platelets 147 140 - 400  Thousand/uL   MPV 10.3 7.5 - 12.5 fL   Neutro Abs 2,427 1,500 - 7,800 cells/uL   Lymphs Abs 662 (L) 850 - 3,900 cells/uL   WBC mixed population 396 200 - 950 cells/uL   Eosinophils Absolute 167 15 - 500 cells/uL   Basophils Absolute 48 0 - 200 cells/uL   Neutrophils Relative % 65.6 %   Total Lymphocyte 17.9 %   Monocytes Relative 10.7 %   Eosinophils Relative 4.5 %   Basophils Relative 1.3 %  PSA, Total with Reflex to PSA, Free  Result Value Ref Range   PSA, Total 1.4 < OR = 4.0 ng/mL  Lipid panel  Result Value Ref Range   Cholesterol 227 (H) <200 mg/dL   HDL 41 >  40 mg/dL   Triglycerides 222 (H) <150 mg/dL   LDL Cholesterol (Calc) 150 (H) mg/dL (calc)   Total CHOL/HDL Ratio 5.5 (H) <5.0 (calc)   Non-HDL Cholesterol (Calc) 186 (H) <130 mg/dL (calc)  COMPLETE METABOLIC PANEL WITH GFR  Result Value Ref Range   Glucose, Bld 131 (H) 65 - 99 mg/dL   BUN 22 7 - 25 mg/dL   Creat 1.07 0.70 - 1.18 mg/dL   GFR, Est Non African American 69 > OR = 60 mL/min/1.68m2   GFR, Est African American 80 > OR = 60 mL/min/1.60m2   BUN/Creatinine Ratio NOT APPLICABLE 6 - 22 (calc)   Sodium 140 135 - 146 mmol/L   Potassium 4.5 3.5 - 5.3 mmol/L   Chloride 107 98 - 110 mmol/L   CO2 26 20 - 32 mmol/L   Calcium 9.4 8.6 - 10.3 mg/dL   Total Protein 6.7 6.1 - 8.1 g/dL   Albumin 4.3 3.6 - 5.1 g/dL   Globulin 2.4 1.9 - 3.7 g/dL (calc)   AG Ratio 1.8 1.0 - 2.5 (calc)   Total Bilirubin 0.5 0.2 - 1.2 mg/dL   Alkaline phosphatase (APISO) 53 40 - 115 U/L   AST 20 10 - 35 U/L   ALT 23 9 - 46 U/L  Hemoglobin A1c  Result Value Ref Range   Hgb A1c MFr Bld 6.8 (H) <5.7 % of total Hgb   Mean Plasma Glucose 148 (calc)   eAG (mmol/L) 8.2 (calc)      Assessment & Plan:   Problem List Items Addressed This Visit    Lumbar canal stenosis   Thoracic and lumbosacral neuritis    Other Visit Diagnoses    Pre-operative clearance    -  Primary      No orders of the defined types were placed in this  encounter.  Pre-op clearance for non-cardiac surgery today, Lumbar Spinal Surgery listed above, (intermediate), general anesthesia. - Previously tolerated prior surgeries with general anesthesia - including prior similar Lumbar Spinal Fusion surgery - Known CAD s/p CABG. Followed by Cardiology, has already been determined to be cleared from cardiac stand point on 08/19/18 - Appropriate functional status >4 METs - Former smoker, quit 1983  Plan: 1. Medically Cleared for elective surgery. Completed provided pre-op paperwork per Neurosurgery office 2. Not due for labs - recently done 07/23/18, normal SCr, 1.07 3. No repeat EKG today. Last done by Dr Ubaldo Glassing 08/2018 4. Based on lack of pulmonary history, and clinical history reassuring, pulmonary exam is normal, will defer Chest X-ray at this time, last done in 2017.  ACC/AHA (reported risk of cardiac death or nonfatal MI generally 1 to 5%). Intermediate Risk (Orthopedic)  Revised Cardiac Risk Index (RCRI) Six independent predictors of major cardiac complications[1]  H/o ischemic heart disease (h/o MI, positive exercise test, active CP myocardial ischemia, use of nitrates for CP, EKG w/ pathological Q waves)  Rate of cardiac death, nonfatal MI, and nonfatal cardiac arrest =  One risk factor - 1.0 percent (95% CI: 0.5-1.4)  Rate of MI, pulmonary edema, v-fib, primary cardiac arrest, and complete heart block = One risk factor - 1.3 percent (95% CI: 0.7-2.1)   Follow up plan: Return if symptoms worsen or fail to improve.  [name], DO [facility] 09/03/2018, 6:24 PM

## 2018-09-09 DIAGNOSIS — Z01818 Encounter for other preprocedural examination: Secondary | ICD-10-CM | POA: Diagnosis not present

## 2018-09-09 DIAGNOSIS — G4733 Obstructive sleep apnea (adult) (pediatric): Secondary | ICD-10-CM | POA: Diagnosis not present

## 2018-09-09 DIAGNOSIS — M5431 Sciatica, right side: Secondary | ICD-10-CM | POA: Diagnosis not present

## 2018-09-09 DIAGNOSIS — E119 Type 2 diabetes mellitus without complications: Secondary | ICD-10-CM | POA: Diagnosis not present

## 2018-09-09 DIAGNOSIS — D696 Thrombocytopenia, unspecified: Secondary | ICD-10-CM | POA: Diagnosis not present

## 2018-09-09 DIAGNOSIS — M5136 Other intervertebral disc degeneration, lumbar region: Secondary | ICD-10-CM | POA: Diagnosis not present

## 2018-09-09 DIAGNOSIS — M7138 Other bursal cyst, other site: Secondary | ICD-10-CM | POA: Diagnosis not present

## 2018-09-09 DIAGNOSIS — R9389 Abnormal findings on diagnostic imaging of other specified body structures: Secondary | ICD-10-CM | POA: Diagnosis not present

## 2018-09-09 DIAGNOSIS — M5432 Sciatica, left side: Secondary | ICD-10-CM | POA: Diagnosis not present

## 2018-09-09 DIAGNOSIS — Z981 Arthrodesis status: Secondary | ICD-10-CM | POA: Diagnosis not present

## 2018-09-09 DIAGNOSIS — C8599 Non-Hodgkin lymphoma, unspecified, extranodal and solid organ sites: Secondary | ICD-10-CM | POA: Diagnosis not present

## 2018-09-09 DIAGNOSIS — M5430 Sciatica, unspecified side: Secondary | ICD-10-CM | POA: Diagnosis not present

## 2018-09-09 DIAGNOSIS — E782 Mixed hyperlipidemia: Secondary | ICD-10-CM | POA: Diagnosis not present

## 2018-09-09 DIAGNOSIS — I2510 Atherosclerotic heart disease of native coronary artery without angina pectoris: Secondary | ICD-10-CM | POA: Diagnosis not present

## 2018-09-09 DIAGNOSIS — M48061 Spinal stenosis, lumbar region without neurogenic claudication: Secondary | ICD-10-CM | POA: Diagnosis not present

## 2018-09-09 DIAGNOSIS — M48062 Spinal stenosis, lumbar region with neurogenic claudication: Secondary | ICD-10-CM | POA: Diagnosis not present

## 2018-09-09 DIAGNOSIS — J984 Other disorders of lung: Secondary | ICD-10-CM | POA: Diagnosis not present

## 2018-09-09 DIAGNOSIS — I10 Essential (primary) hypertension: Secondary | ICD-10-CM | POA: Diagnosis not present

## 2018-09-10 DIAGNOSIS — R9389 Abnormal findings on diagnostic imaging of other specified body structures: Secondary | ICD-10-CM | POA: Insufficient documentation

## 2018-09-10 DIAGNOSIS — D696 Thrombocytopenia, unspecified: Secondary | ICD-10-CM | POA: Insufficient documentation

## 2018-09-19 DIAGNOSIS — M5431 Sciatica, right side: Secondary | ICD-10-CM | POA: Insufficient documentation

## 2018-09-19 DIAGNOSIS — M5432 Sciatica, left side: Secondary | ICD-10-CM

## 2018-09-19 DIAGNOSIS — Z981 Arthrodesis status: Secondary | ICD-10-CM | POA: Insufficient documentation

## 2018-09-21 DIAGNOSIS — I10 Essential (primary) hypertension: Secondary | ICD-10-CM | POA: Diagnosis not present

## 2018-09-21 DIAGNOSIS — Z9221 Personal history of antineoplastic chemotherapy: Secondary | ICD-10-CM | POA: Diagnosis not present

## 2018-09-21 DIAGNOSIS — E861 Hypovolemia: Secondary | ICD-10-CM | POA: Diagnosis not present

## 2018-09-21 DIAGNOSIS — M1710 Unilateral primary osteoarthritis, unspecified knee: Secondary | ICD-10-CM | POA: Diagnosis present

## 2018-09-21 DIAGNOSIS — M7138 Other bursal cyst, other site: Secondary | ICD-10-CM | POA: Diagnosis not present

## 2018-09-21 DIAGNOSIS — Z86010 Personal history of colonic polyps: Secondary | ICD-10-CM | POA: Diagnosis not present

## 2018-09-21 DIAGNOSIS — I2510 Atherosclerotic heart disease of native coronary artery without angina pectoris: Secondary | ICD-10-CM | POA: Diagnosis not present

## 2018-09-21 DIAGNOSIS — E119 Type 2 diabetes mellitus without complications: Secondary | ICD-10-CM | POA: Diagnosis not present

## 2018-09-21 DIAGNOSIS — M5432 Sciatica, left side: Secondary | ICD-10-CM | POA: Diagnosis not present

## 2018-09-21 DIAGNOSIS — Z981 Arthrodesis status: Secondary | ICD-10-CM | POA: Diagnosis not present

## 2018-09-21 DIAGNOSIS — C8599 Non-Hodgkin lymphoma, unspecified, extranodal and solid organ sites: Secondary | ICD-10-CM | POA: Diagnosis not present

## 2018-09-21 DIAGNOSIS — Z923 Personal history of irradiation: Secondary | ICD-10-CM | POA: Diagnosis not present

## 2018-09-21 DIAGNOSIS — M4316 Spondylolisthesis, lumbar region: Secondary | ICD-10-CM | POA: Diagnosis not present

## 2018-09-21 DIAGNOSIS — Z8572 Personal history of non-Hodgkin lymphomas: Secondary | ICD-10-CM | POA: Diagnosis not present

## 2018-09-21 DIAGNOSIS — Z87891 Personal history of nicotine dependence: Secondary | ICD-10-CM | POA: Diagnosis not present

## 2018-09-21 DIAGNOSIS — I252 Old myocardial infarction: Secondary | ICD-10-CM | POA: Diagnosis not present

## 2018-09-21 DIAGNOSIS — R9389 Abnormal findings on diagnostic imaging of other specified body structures: Secondary | ICD-10-CM | POA: Diagnosis not present

## 2018-09-21 DIAGNOSIS — M5136 Other intervertebral disc degeneration, lumbar region: Secondary | ICD-10-CM | POA: Diagnosis present

## 2018-09-21 DIAGNOSIS — M48061 Spinal stenosis, lumbar region without neurogenic claudication: Secondary | ICD-10-CM | POA: Diagnosis not present

## 2018-09-21 DIAGNOSIS — M48062 Spinal stenosis, lumbar region with neurogenic claudication: Secondary | ICD-10-CM | POA: Diagnosis not present

## 2018-09-21 DIAGNOSIS — E782 Mixed hyperlipidemia: Secondary | ICD-10-CM | POA: Diagnosis not present

## 2018-09-21 DIAGNOSIS — M5431 Sciatica, right side: Secondary | ICD-10-CM | POA: Diagnosis not present

## 2018-09-21 DIAGNOSIS — Z01818 Encounter for other preprocedural examination: Secondary | ICD-10-CM | POA: Diagnosis not present

## 2018-09-21 DIAGNOSIS — D696 Thrombocytopenia, unspecified: Secondary | ICD-10-CM | POA: Diagnosis not present

## 2018-09-21 DIAGNOSIS — J984 Other disorders of lung: Secondary | ICD-10-CM | POA: Diagnosis present

## 2018-09-21 DIAGNOSIS — I9589 Other hypotension: Secondary | ICD-10-CM | POA: Diagnosis not present

## 2018-09-23 DIAGNOSIS — D62 Acute posthemorrhagic anemia: Secondary | ICD-10-CM | POA: Insufficient documentation

## 2018-09-24 MED ORDER — LIDOCAINE 5 % EX PTCH
1.00 | MEDICATED_PATCH | CUTANEOUS | Status: DC
Start: 2018-09-24 — End: 2018-09-24

## 2018-09-24 MED ORDER — SENNOSIDES-DOCUSATE SODIUM 8.6-50 MG PO TABS
1.00 | ORAL_TABLET | ORAL | Status: DC
Start: 2018-09-24 — End: 2018-09-24

## 2018-09-24 MED ORDER — ALUM & MAG HYDROXIDE-SIMETH 400-400-40 MG/5ML PO SUSP
30.00 | ORAL | Status: DC
Start: ? — End: 2018-09-24

## 2018-09-24 MED ORDER — GENERIC EXTERNAL MEDICATION
1.00 | Status: DC
Start: ? — End: 2018-09-24

## 2018-09-24 MED ORDER — CARBOXYMETHYLCELLULOSE SOD PF 0.5 % OP SOLN
1.00 | OPHTHALMIC | Status: DC
Start: ? — End: 2018-09-24

## 2018-09-24 MED ORDER — ASPIRIN 81 MG PO CHEW
81.00 | CHEWABLE_TABLET | ORAL | Status: DC
Start: 2018-09-25 — End: 2018-09-24

## 2018-09-24 MED ORDER — EZETIMIBE 10 MG PO TABS
10.00 | ORAL_TABLET | ORAL | Status: DC
Start: 2018-09-25 — End: 2018-09-24

## 2018-09-24 MED ORDER — METFORMIN HCL 500 MG PO TABS
500.00 | ORAL_TABLET | ORAL | Status: DC
Start: 2018-09-24 — End: 2018-09-24

## 2018-09-24 MED ORDER — OXYCODONE HCL 5 MG PO TABS
5.00 | ORAL_TABLET | ORAL | Status: DC
Start: ? — End: 2018-09-24

## 2018-09-24 MED ORDER — SCOPOLAMINE 1 MG/3DAYS TD PT72
1.00 | MEDICATED_PATCH | TRANSDERMAL | Status: DC
Start: 2018-09-27 — End: 2018-09-24

## 2018-09-24 MED ORDER — CARVEDILOL 6.25 MG PO TABS
3.13 | ORAL_TABLET | ORAL | Status: DC
Start: 2018-09-24 — End: 2018-09-24

## 2018-09-24 MED ORDER — SODIUM CHLORIDE FLUSH 0.9 % IV SOLN
5.00 | INTRAVENOUS | Status: DC
Start: ? — End: 2018-09-24

## 2018-09-24 MED ORDER — POLYETHYLENE GLYCOL 3350 17 G PO PACK
17.00 | PACK | ORAL | Status: DC
Start: 2018-09-25 — End: 2018-09-24

## 2018-09-24 MED ORDER — GENERIC EXTERNAL MEDICATION
4.00 | Status: DC
Start: ? — End: 2018-09-24

## 2018-09-24 MED ORDER — SIMETHICONE 80 MG PO CHEW
80.00 | CHEWABLE_TABLET | ORAL | Status: DC
Start: ? — End: 2018-09-24

## 2018-09-24 MED ORDER — METHOCARBAMOL 750 MG PO TABS
750.00 | ORAL_TABLET | ORAL | Status: DC
Start: ? — End: 2018-09-24

## 2018-09-24 MED ORDER — FAMOTIDINE 20 MG PO TABS
20.00 | ORAL_TABLET | ORAL | Status: DC
Start: ? — End: 2018-09-24

## 2018-10-19 ENCOUNTER — Ambulatory Visit
Admission: RE | Admit: 2018-10-19 | Discharge: 2018-10-19 | Disposition: A | Discharge status: Home / Self Care | Payer: Medicare Other | Source: Ambulatory Visit | Attending: Internal Medicine | Admitting: Internal Medicine

## 2018-10-19 ENCOUNTER — Inpatient Hospital Stay: Payer: Medicare Other | Attending: Internal Medicine | Admitting: Internal Medicine

## 2018-10-19 ENCOUNTER — Telehealth: Payer: Self-pay | Admitting: Internal Medicine

## 2018-10-19 ENCOUNTER — Encounter: Payer: Self-pay | Admitting: Internal Medicine

## 2018-10-19 ENCOUNTER — Inpatient Hospital Stay: Payer: Medicare Other

## 2018-10-19 VITALS — BP 126/75 | HR 66 | Temp 97.9°F | Resp 16 | Wt 196.3 lb

## 2018-10-19 DIAGNOSIS — Z7984 Long term (current) use of oral hypoglycemic drugs: Secondary | ICD-10-CM

## 2018-10-19 DIAGNOSIS — D649 Anemia, unspecified: Secondary | ICD-10-CM

## 2018-10-19 DIAGNOSIS — Z9221 Personal history of antineoplastic chemotherapy: Secondary | ICD-10-CM

## 2018-10-19 DIAGNOSIS — E785 Hyperlipidemia, unspecified: Secondary | ICD-10-CM

## 2018-10-19 DIAGNOSIS — I252 Old myocardial infarction: Secondary | ICD-10-CM

## 2018-10-19 DIAGNOSIS — M549 Dorsalgia, unspecified: Secondary | ICD-10-CM

## 2018-10-19 DIAGNOSIS — Z87891 Personal history of nicotine dependence: Secondary | ICD-10-CM

## 2018-10-19 DIAGNOSIS — C8599 Non-Hodgkin lymphoma, unspecified, extranodal and solid organ sites: Secondary | ICD-10-CM

## 2018-10-19 DIAGNOSIS — Z7982 Long term (current) use of aspirin: Secondary | ICD-10-CM | POA: Diagnosis not present

## 2018-10-19 DIAGNOSIS — I2510 Atherosclerotic heart disease of native coronary artery without angina pectoris: Secondary | ICD-10-CM

## 2018-10-19 DIAGNOSIS — M129 Arthropathy, unspecified: Secondary | ICD-10-CM | POA: Diagnosis not present

## 2018-10-19 DIAGNOSIS — Z79899 Other long term (current) drug therapy: Secondary | ICD-10-CM | POA: Diagnosis not present

## 2018-10-19 DIAGNOSIS — R911 Solitary pulmonary nodule: Secondary | ICD-10-CM

## 2018-10-19 DIAGNOSIS — Z923 Personal history of irradiation: Secondary | ICD-10-CM

## 2018-10-19 LAB — COMPREHENSIVE METABOLIC PANEL
ALBUMIN: 4 g/dL (ref 3.5–5.0)
ALT: 17 U/L (ref 0–44)
ANION GAP: 9 (ref 5–15)
AST: 20 U/L (ref 15–41)
Alkaline Phosphatase: 88 U/L (ref 38–126)
BUN: 21 mg/dL (ref 8–23)
CHLORIDE: 106 mmol/L (ref 98–111)
CO2: 26 mmol/L (ref 22–32)
Calcium: 9.5 mg/dL (ref 8.9–10.3)
Creatinine, Ser: 0.9 mg/dL (ref 0.61–1.24)
GFR calc Af Amer: >60 mL/min (ref >60)
GFR calc non Af Amer: >60 mL/min (ref >60)
GLUCOSE: 111 mg/dL — AB (ref 70–99)
POTASSIUM: 4.1 mmol/L (ref 3.5–5.1)
SODIUM: 141 mmol/L (ref 135–145)
Total Bilirubin: 0.6 mg/dL (ref 0.3–1.2)
Total Protein: 7.1 g/dL (ref 6.5–8.1)

## 2018-10-19 LAB — CBC WITH DIFFERENTIAL/PLATELET
Abs Immature Granulocytes: 0.02 10*3/uL (ref 0.00–0.07)
Basophils Absolute: 0 10*3/uL (ref 0.0–0.1)
Basophils Relative: 1 %
Eosinophils Absolute: 0.2 10*3/uL (ref 0.0–0.5)
Eosinophils Relative: 5 %
HCT: 34.4 % — ABNORMAL LOW (ref 39.0–52.0)
HEMOGLOBIN: 11.3 g/dL — AB (ref 13.0–17.0)
IMMATURE GRANULOCYTES: 1 %
LYMPHS PCT: 15 %
Lymphs Abs: 0.7 10*3/uL (ref 0.7–4.0)
MCH: 30.5 pg (ref 26.0–34.0)
MCHC: 32.8 g/dL (ref 30.0–36.0)
MCV: 92.7 fL (ref 80.0–100.0)
MONO ABS: 0.5 10*3/uL (ref 0.1–1.0)
MONOS PCT: 12 %
NEUTROS ABS: 2.9 10*3/uL (ref 1.7–7.7)
NEUTROS PCT: 66 %
PLATELETS: 161 10*3/uL (ref 150–400)
RBC: 3.71 MIL/uL — AB (ref 4.22–5.81)
RDW: 13.3 % (ref 11.5–15.5)
WBC: 4.4 10*3/uL (ref 4.0–10.5)
nRBC: 0 % (ref 0.0–0.2)

## 2018-10-19 LAB — LACTATE DEHYDROGENASE: LDH: 145 U/L (ref 98–192)

## 2018-10-19 NOTE — Assessment & Plan Note (Addendum)
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  Status post chemotherapy followed by radiation; February 2019 PET scan CR; upper endoscopy March 2019 normal as per patient.  Stable; continue current surveillance without imaging.  # Mild Anemia- 11.3; ? Sec to recent back surgery.  Repeat labs in 2 months.  #Chronic back pain s/p back surgery; improved.   # Right lung 5 mm nodule [incidental]; reviewed the PET from march 2019- present.  Await repeat chest x-ray today.  If stable no further work-up.  Remote history of smoking.  # DISPOSITION:  # CXR today # in 2 months-lab- [facility]bc/ [facility] # follow up in 6 months- [place]/MD-labs-cbc/cmp/ldh- Dr.B

## 2018-10-19 NOTE — Progress Notes (Signed)
[place] OFFICE PROGRESS NOTE  Patient Care Team: Olin Hauser, DO as PCP - General (Family Medicine)  Cancer Staging No matching staging information was found for the patient.   Oncology History   # AUG 2018- DIFFUSE LARGE B CELL LYMPHOMA STOMACH-STAGE IE [BMBx-NEG]Fundus ;CD-20 pos;variable- bcl6/mum-?? GCB vs. ABC subtype;  [EGD for IDA; Dr.Hung; [place]]- NEG for FISH for;myc; bcl-6; bcl-2;11:14; MALT [8q21];NEG for CD-10; ki-67-70%; SEP PET- uptake in stomach. Hepatitis panel-NEG; BMBx-NEG  # SEP 25th 2018- R-CHOP x3 ; RT [until jan 3rd 2019]; PET Feb 2019- CR; s/p EGD- NEG [March 2019]  # AUG 2018- H.pylori positive/stomach body.   # IDA-resolved  # CAD [s/p CABG; Dr.fath];MUGA scan [sep 2018]- 56.5%; port explanted.  ---------------------------------------------------------------   DIAGNOSIS: [ aug 2018]- DLBCL OD STOMACH  STAGE: IE ; GOALS: curative  CURRENT/MOST RECENT THERAPY;  surveillance      Lymphoma of fundus of stomach ([place])      INTERVAL HISTORY:  Corey Daniel 72 y.o.  male pleasant patient above history of diffuse large B-cell lymphoma of the stomach is here for follow-up.  In the interim patient had low back surgery at [facility].  Currently improving.  However patient on a preoperative x-ray noted to have a 5 mm lung nodule.  Patient is anxious about this.  He denies any blood in stools black or stools.  No nausea no vomiting.  Review of Systems  Constitutional: Negative for chills, diaphoresis, fever, malaise/fatigue and weight loss.  HENT: Negative for nosebleeds and sore throat.   Eyes: Negative for double vision.  Respiratory: Negative for cough, hemoptysis, sputum production, shortness of breath and wheezing.   Cardiovascular: Negative for chest pain, palpitations, orthopnea and leg swelling.  Gastrointestinal: Negative for abdominal pain, blood in stool, constipation, diarrhea, heartburn, melena, nausea and vomiting.   Genitourinary: Negative for dysuria, frequency and urgency.  Musculoskeletal: Positive for back pain. Negative for joint pain.  Skin: Negative.  Negative for itching and rash.  Neurological: Negative for dizziness, tingling, focal weakness, weakness and headaches.  Endo/Heme/Allergies: Does not bruise/bleed easily.  Psychiatric/Behavioral: Negative for depression. The patient is not nervous/anxious and does not have insomnia.       PAST MEDICAL HISTORY :  Past Medical History:  Diagnosis Date  . Arthritis   . ASCVD (arteriosclerotic cardiovascular disease)   . Back pain    with radiation  . Cancer ([place])    lymphoma  . Coronary artery disease   . Hyperlipidemia   . IDA (iron deficiency anemia)   . Lower leg pain   . Myocardial infarction [facility])     PAST SURGICAL HISTORY :   Past Surgical History:  Procedure Laterality Date  . AUTOGRAFT STRUCTURAL ICBG OBTAINED SEPARATE INCISION FOR SPINE SURGERY   Bilateral 07/15/2013  . BACK SURGERY    . CATARACT EXTRACTION  2010  . COLONOSCOPY  06/2017  . CORONARY ARTERY BYPASS GRAFT    . IR IMAGING GUIDED PORT INSERTION  07/31/2017  . IR REMOVAL TUN ACCESS W/ PORT W/O FL MOD SED  02/12/2018  . LAMINECTOMY LUMBAR EXCISON/EVACUATION EXTRADURAL INTRASPINAL LESION Bilateral 07/15/2013     . LAMINOTOMY REEXPLORATION POSTERIOR LUMBAR W/NERVE DECOMP & DISCECTOMY Bilateral 07/15/2013     . UPPER GI ENDOSCOPY  06/2017    FAMILY HISTORY :   Family History  Problem Relation Age of Onset  . Heart disease Mother   . Coarctation of the aorta Brother     SOCIAL HISTORY:   Social History  Tobacco Use  . Smoking status: Former Smoker    Packs/day: 0.50    Years: 30.00    Pack years: 15.00    Types: Cigarettes    Last attempt to quit: 1983    Years since quitting: 36.9  . Smokeless tobacco: Former Network engineer Use Topics  . Alcohol use: No  . Drug use: No    ALLERGIES:  is allergic to tramadol.  MEDICATIONS:  Current Outpatient  Medications  Medication Sig Dispense Refill  . aspirin 81 MG chewable tablet Chew 81 mg by mouth daily.     . carvedilol (COREG) 3.125 MG tablet Take 1 tablet (3.125 mg total) by mouth 2 (two) times daily with a meal. 180 tablet 3  . Cholecalciferol (VITAMIN D3) 2000 UNITS capsule Take 2,000 Units by mouth daily.     . Coenzyme Q10 100 MG capsule Take by mouth.    . ezetimibe (ZETIA) 10 MG tablet Take 1 tablet (10 mg total) by mouth at bedtime. 90 tablet 3  . Fish Oil-Cholecalciferol (FISH OIL + D3) 1000-1000 MG-UNIT CAPS Take by mouth.    . fluticasone (FLONASE) 50 MCG/ACT nasal spray Place into the nose.    Marland Kitchen lisinopril (ZESTRIL) 2.5 MG tablet Take 1 tablet (2.5 mg total) by mouth daily. 90 tablet 3  . metFORMIN (GLUCOPHAGE) 500 MG tablet Take 1 tablet (500 mg total) by mouth 2 (two) times daily with a meal. 180 tablet 1  . omeprazole (PRILOSEC) 40 MG capsule daily as needed.     . rosuvastatin (CRESTOR) 5 MG tablet Take 1 tablet (5 mg total) by mouth 3 (three) times a week. 36 tablet 3   No current facility-administered medications for this visit.     PHYSICAL EXAMINATION: ECOG PERFORMANCE STATUS: 0 - Asymptomatic  BP 126/75 (BP Location: Left Arm, Patient Position: Sitting)   Pulse 66   Temp 97.9 F (36.6 C) (Tympanic)   Resp 16   Wt 196 lb 5.1 oz (89 kg)   BMI 28.17 kg/m   Filed Weights   10/19/18 1411  Weight: 196 lb 5.1 oz (89 kg)   Physical Exam  Constitutional: He is oriented to person, place, and time and well-developed, well-nourished, and in no distress.  HENT:  Head: Normocephalic and atraumatic.  Mouth/Throat: Oropharynx is clear and moist. No oropharyngeal exudate.  Eyes: Pupils are equal, round, and reactive to light.  Neck: Normal range of motion. Neck supple.  Cardiovascular: Normal rate and regular rhythm.  Pulmonary/Chest: No respiratory distress. He has no wheezes.  Abdominal: Soft. Bowel sounds are normal. He exhibits no distension and no mass. There is  no tenderness. There is no rebound and no guarding.  Musculoskeletal: Normal range of motion. He exhibits no edema or tenderness.  Neurological: He is alert and oriented to person, place, and time.  Skin: Skin is warm.  Psychiatric: Affect normal.       LABORATORY DATA:  I have reviewed the data as listed    Component Value Date/Time   NA 141 10/19/2018 1348   NA 139 02/06/2016 0806   K 4.1 10/19/2018 1348   CL 106 10/19/2018 1348   CO2 26 10/19/2018 1348   GLUCOSE 111 (H) 10/19/2018 1348   BUN 21 10/19/2018 1348   BUN 18 02/06/2016 0806   CREATININE 0.90 10/19/2018 1348   CREATININE 1.07 07/23/2018 0826   CALCIUM 9.5 10/19/2018 1348   PROT 7.1 10/19/2018 1348   PROT 7.1 02/06/2016 0806   ALBUMIN 4.0 10/19/2018 1348  ALBUMIN 4.7 02/06/2016 0806   AST 20 10/19/2018 1348   ALT 17 10/19/2018 1348   ALKPHOS 88 10/19/2018 1348   BILITOT 0.6 10/19/2018 1348   BILITOT 0.5 02/06/2016 0806   GFRNONAA >60 10/19/2018 1348   GFRNONAA 69 07/23/2018 0826   GFRAA >60 10/19/2018 1348   GFRAA 80 07/23/2018 0826    No results found for: SPEP, UPEP  Lab Results  Component Value Date   WBC 4.4 10/19/2018   NEUTROABS 2.9 10/19/2018   HGB 11.3 (L) 10/19/2018   HCT 34.4 (L) 10/19/2018   MCV 92.7 10/19/2018   PLT 161 10/19/2018      Chemistry      Component Value Date/Time   NA 141 10/19/2018 1348   NA 139 02/06/2016 0806   K 4.1 10/19/2018 1348   CL 106 10/19/2018 1348   CO2 26 10/19/2018 1348   BUN 21 10/19/2018 1348   BUN 18 02/06/2016 0806   CREATININE 0.90 10/19/2018 1348   CREATININE 1.07 07/23/2018 0826      Component Value Date/Time   CALCIUM 9.5 10/19/2018 1348   ALKPHOS 88 10/19/2018 1348   AST 20 10/19/2018 1348   ALT 17 10/19/2018 1348   BILITOT 0.6 10/19/2018 1348   BILITOT 0.5 02/06/2016 0806       RADIOGRAPHIC STUDIES: I have personally reviewed the radiological images as listed and agreed with the findings in the report. Dg Chest 2 View  Result  Date: 10/19/2018 CLINICAL DATA:  Hx of lymphoma of the gastric fundus last year with chemo and rad tx. Hx of most recent back surg with pre op chest x-ray. X-ray showed lung nodule. Hx of open heart surg. NKI Non-smoker EXAM: CHEST - 2 VIEW COMPARISON:  Chest CT on 01/07/2018 FINDINGS: Median sternotomy and CABG. The heart size is normal. The lungs are free of focal consolidations and pleural effusions. Surgical clips are identified in the gastroesophageal junction region. IMPRESSION: No evidence for acute cardiopulmonary abnormality. Electronically Signed   By: Nolon Nations M.D.   On: 10/19/2018 15:31     ASSESSMENT & PLAN:  Lymphoma of fundus of stomach ([place]) #Diffuse large B-cell lymphoma of the stomach; stage I E.  Status post chemotherapy followed by radiation; February 2019 PET scan CR; upper endoscopy March 2019 normal as per patient.  Stable; continue current surveillance without imaging.  # Mild Anemia- 11.3; ? Sec to recent back surgery.  Repeat labs in 2 months.  #Chronic back pain s/p back surgery; improved.   # Right lung 5 mm nodule [incidental]; reviewed the PET from march 2019- present.  Await repeat chest x-ray today.  If stable no further work-up.  Remote history of smoking.  # DISPOSITION:  # CXR today # in 2 months-lab- [facility]/ [facility] # follow up in 6 months- [place]/MD-labs-cbc/cmp/ldh- Dr.B     Orders Placed This Encounter  Procedures  . DG Chest 2 View    Standing Status:   Future    Number of Occurrences:   1    Standing Expiration Date:   12/19/2019    Order Specific Question:   Reason for Exam (SYMPTOM  OR DIAGNOSIS REQUIRED)    Answer:   right middle lobe nodule- 42m/incidental.    Order Specific Question:   Preferred imaging location?    Answer:   [facility] [place]   All questions were answered. The patient knows to call the clinic with any problems, questions or concerns.      GCammie Sickle MD 10/19/2018 4:16  PM

## 2018-10-19 NOTE — Telephone Encounter (Signed)
Mychart message sent.

## 2018-10-25 DIAGNOSIS — Z981 Arthrodesis status: Secondary | ICD-10-CM | POA: Diagnosis not present

## 2018-10-25 DIAGNOSIS — M4316 Spondylolisthesis, lumbar region: Secondary | ICD-10-CM | POA: Diagnosis not present

## 2018-10-27 ENCOUNTER — Encounter: Payer: Self-pay | Admitting: Family Medicine

## 2018-10-27 ENCOUNTER — Ambulatory Visit (INDEPENDENT_AMBULATORY_CARE_PROVIDER_SITE_OTHER): Payer: Medicare Other | Admitting: Family Medicine

## 2018-10-27 VITALS — BP 121/65 | HR 61 | Temp 98.0°F | Resp 16 | Ht 70.0 in | Wt 197.0 lb

## 2018-10-27 DIAGNOSIS — E782 Mixed hyperlipidemia: Secondary | ICD-10-CM | POA: Diagnosis not present

## 2018-10-27 DIAGNOSIS — I10 Essential (primary) hypertension: Secondary | ICD-10-CM | POA: Diagnosis not present

## 2018-10-27 DIAGNOSIS — Z862 Personal history of diseases of the blood and blood-forming organs and certain disorders involving the immune mechanism: Secondary | ICD-10-CM | POA: Diagnosis not present

## 2018-10-27 DIAGNOSIS — R7303 Prediabetes: Secondary | ICD-10-CM | POA: Diagnosis not present

## 2018-10-27 DIAGNOSIS — M48061 Spinal stenosis, lumbar region without neurogenic claudication: Secondary | ICD-10-CM | POA: Diagnosis not present

## 2018-10-27 DIAGNOSIS — C8599 Non-Hodgkin lymphoma, unspecified, extranodal and solid organ sites: Secondary | ICD-10-CM | POA: Diagnosis not present

## 2018-10-27 DIAGNOSIS — Z23 Encounter for immunization: Secondary | ICD-10-CM

## 2018-10-27 LAB — POCT GLYCOSYLATED HEMOGLOBIN (HGB A1C): Hemoglobin A1C: 5.4 % (ref 4.0–5.6)

## 2018-10-27 NOTE — Assessment & Plan Note (Signed)
Previously elevated lipids Last visit restarted Statin Rosuvastatin intermittent w/ Zetia - tolerating well still Will re-check fasting lipid in 3 months

## 2018-10-27 NOTE — Assessment & Plan Note (Signed)
Dramatic A1c improvement from 6.8 down to 5.4 - on metformin and lifestyle overhaul Concern with HTN, HLD, CAD  Plan:  1. REDUCE Metformin from 500 BID down to 500mg  daily for now temporarily then stop completely in 6+ weeks 2. Encourage improved lifestyle - keto / low carb diet, keep improving exercise 3. Follow-up 3 months w/ labs A1c

## 2018-10-27 NOTE — Assessment & Plan Note (Signed)
Followed by [facility] S/p back surgery 09/2018 Improving back pain

## 2018-10-27 NOTE — Patient Instructions (Addendum)
Thank you for coming to the office today.  High dose flu shot today  Recent Labs    03/26/18 0829 07/23/18 0826 10/27/18 0845  HGBA1C 6.1* 6.8* 5.4   Reduce Metformin from 500 twice a day - down to 1 pill of the 500mg  once a day with meal for next several weeks, and then may stop completely - as long as keeping up with lifestyle and diet.  DUE for FASTING BLOOD WORK (no food or drink after midnight before the lab appointment, only water or coffee without cream/sugar on the morning of)  SCHEDULE "Lab Only" visit in the morning at the clinic for lab draw in 3 MONTHS   - Make sure Lab Only appointment is at about 1 week before your next appointment, so that results will be available  For Lab Results, once available within 2-3 days of blood draw, you can can log in to MyChart online to view your results and a brief explanation. Also, we can discuss results at next follow-up visit.  Please schedule a Follow-up Appointment to: Return in about 3 months (around 01/26/2019) for Lab results and PreDM, HTN, HLD.  If you have any other questions or concerns, please feel free to call the office or send a message through [place]. You may also schedule an earlier appointment if necessary.  Additionally, you may be receiving a survey about your experience at our office within a few days to 1 week by e-mail or mail. We value your feedback.  [name], DO [facility]

## 2018-10-27 NOTE — Assessment & Plan Note (Signed)
Controlled HTN on low dose meds  Complicated by CAD    Plan:  1. Continue current BP regimen Coreg 3.125mg BID, Lisinopril 2.5mg 2. Encourage improved lifestyle - low sodium diet, regular exercise 3. Continue monitor BP outside office, bring readings to next visit, if persistently >140/90 or new symptoms notify office sooner 

## 2018-10-27 NOTE — Progress Notes (Signed)
Subjective:    Patient ID: Corey Daniel, male    DOB: 01-16-1946, 72 y.o.   MRN: [id number]  Corey Daniel is a 72 y.o. male presenting on 10/27/2018 for Hypertension and prediabetes   HPI   Pre-Diabetes: Previous trend with A1c mid 6s up to 6.8. He has dramatically improved lifestyle and now today A1c improved to 5.4 CBGs: Not checking CBG - was checked at hospital Meds: Metformin 500mg  BID Tolerating medicine  Currently on ACEi Lifestyle: - Weight down 15-20 lbs in past 3 months - Diet (Significantly improved to low carb vs keto diet) - Exercise (Improving exercise still, gradual improve again after exercise) Denies hypoglycemia, polyuria, visual changes, numbness or tingling.  CHRONIC HTN: Reports no concerns, checking BP outside office Current Meds - Lisinopril 2.5mg  daily, Carvedilol 3.125mg  BID Reports good compliance, took meds today. Tolerating well, w/o complaints. Denies CP, dyspnea, HA, edema, dizziness / lightheadedness  HYPERLIPIDEMIA: - Reports no concerns. Last lipid panel 07/2018, elevated - was restarted back on statin last time - Currently taking Rosuvastatin intermittent and Zetia, tolerating well without side effects or myalgias  Follow-up Lumbar Spinal Stenosis, Sciatica / DJD S/p Back Surgery per Dr Corey Daniel through [facility], 09/2018. He has lost a lot of weight post op and in hospital, now improving his strength and exercising more regularly. His back pain is improving.  Follow-up Anemia / Lymphoma Followed by Heme Onc Dr Corey Daniel - had lower Hgb recently  Health Maintenance: Due for High Dose Flu Shot, will receive today    Depression screen [facility] 2/9 10/27/2018 09/03/2018 07/28/2018  Decreased Interest 0 0 0  Down, Depressed, Hopeless 0 0 0  PHQ - 2 Score 0 0 0    Social History   Tobacco Use  . Smoking status: Former Smoker    Packs/day: 0.50    Years: 30.00    Pack years: 15.00    Types: Cigarettes    Last attempt to  quit: 1983    Years since quitting: 36.9  . Smokeless tobacco: Former Network engineer Use Topics  . Alcohol use: No  . Drug use: No    Review of Systems Per HPI unless specifically indicated above     Objective:    BP 121/65   Pulse 61   Temp 98 F (36.7 C) (Oral)   Resp 16   Ht 5\' 10"  (1.778 m)   Wt 197 lb (89.4 kg)   BMI 28.27 kg/m   Wt Readings from Last 3 Encounters:  10/27/18 197 lb (89.4 kg)  10/19/18 196 lb 5.1 oz (89 kg)  09/03/18 210 lb (95.3 kg)    Physical Exam  Constitutional: He is oriented to person, place, and time. He appears well-developed and well-nourished. No distress.  Well-appearing, comfortable, cooperative  HENT:  Head: Normocephalic and atraumatic.  Mouth/Throat: Oropharynx is clear and moist.  Eyes: Conjunctivae are normal. Right eye exhibits no discharge. Left eye exhibits no discharge.  Neck: Normal range of motion. Neck supple.  Cardiovascular: Normal rate, regular rhythm, normal heart sounds and intact distal pulses.  No murmur heard. Pulmonary/Chest: Effort normal.  Musculoskeletal: Normal range of motion. He exhibits no edema.  Neurological: He is alert and oriented to person, place, and time.  Skin: Skin is warm and dry. No rash noted. He is not diaphoretic. No erythema.  Psychiatric: He has a normal mood and affect. His behavior is normal.  Well groomed, good eye contact, normal speech and thoughts  Nursing note and vitals reviewed.  Results for orders placed or performed in visit on 10/27/18  POCT HgB A1C  Result Value Ref Range   Hemoglobin A1C 5.4 4.0 - 5.6 %      Assessment & Plan:   Problem List Items Addressed This Visit    Essential hypertension    Controlled HTN on low dose meds Complicated by CAD   Plan:  1. Continue current BP regimen Coreg 3.125mg  BID, Lisinopril 2.5mg  2. Encourage improved lifestyle - low sodium diet, regular exercise 3. Continue monitor BP outside office, bring readings to next visit, if  persistently >140/90 or new symptoms notify office sooner      Relevant Orders   COMPLETE METABOLIC PANEL WITH GFR   History of anemia   Relevant Orders   CBC with Differential/Platelet   Lumbar canal stenosis    Followed by [facility] S/p back surgery 09/2018 Improving back pain      Lymphoma of fundus of stomach (HCC)   Relevant Orders   CBC with Differential/Platelet   Mixed hyperlipidemia    Previously elevated lipids Last visit restarted Statin Rosuvastatin intermittent w/ Zetia - tolerating well still Will re-check fasting lipid in 3 months      Relevant Orders   Lipid panel   Pre-diabetes - Primary    Dramatic A1c improvement from 6.8 down to 5.4 - on metformin and lifestyle overhaul Concern with HTN, HLD, CAD  Plan:  1. REDUCE Metformin from 500 BID down to 500mg  daily for now temporarily then stop completely in 6+ weeks 2. Encourage improved lifestyle - keto / low carb diet, keep improving exercise 3. Follow-up 3 months w/ labs A1c      Relevant Orders   POCT HgB A1C (Completed)   Hemoglobin A1c    Other Visit Diagnoses    Needs flu shot       Relevant Orders   Flu vaccine HIGH DOSE PF      No orders of the defined types were placed in this encounter.   Follow up plan: Return in about 3 months (around 01/26/2019) for Lab results and PreDM, HTN, HLD.  Future labs ordered for 01/2019  Corey Daniel, [facility] 10/27/2018, 9:05 AM

## 2018-11-24 ENCOUNTER — Ambulatory Visit (INDEPENDENT_AMBULATORY_CARE_PROVIDER_SITE_OTHER): Payer: Medicare Other

## 2018-11-24 VITALS — BP 122/78 | HR 72 | Temp 98.4°F | Resp 16 | Ht 70.0 in | Wt 198.6 lb

## 2018-11-24 DIAGNOSIS — Z0000 Encounter for general adult medical examination without abnormal findings: Secondary | ICD-10-CM | POA: Diagnosis not present

## 2018-11-24 NOTE — Patient Instructions (Signed)
Corey Daniel , Thank you for taking time to come for your Medicare Wellness Visit. I appreciate your ongoing commitment to your health goals. Please review the following plan we discussed and let me know if I can assist you in the future.   Screening recommendations/referrals: Colonoscopy: 01/20/17 repeat in 2021 Recommended yearly ophthalmology/optometry visit for glaucoma screening and checkup Recommended yearly dental visit for hygiene and checkup  Vaccinations: Influenza vaccine: done 10/27/18 Pneumococcal vaccine: done 08/07/16 Tdap vaccine: done 04/17/14 Shingles vaccine: Shingrix discussed. Please contact your pharmacy for coverage information.     Advanced directives: Advance directive discussed with you today. I have provided a copy for you to complete at home and have notarized. Once this is complete please bring a copy in to our office so we can scan it into your chart.  Conditions/risks identified: Recommend increasing physical activity as tolerated.  Next appointment: Please follow up in one year for your Medicare Annual Wellness visit.    Preventive Care 41 Years and Older, Male Preventive care refers to lifestyle choices and visits with your health care provider that can promote health and wellness. What does preventive care include?  A yearly physical exam. This is also called an annual well check.  Dental exams once or twice a year.  Routine eye exams. Ask your health care provider how often you should have your eyes checked.  Personal lifestyle choices, including:  Daily care of your teeth and gums.  Regular physical activity.  Eating a healthy diet.  Avoiding tobacco and drug use.  Limiting alcohol use.  Practicing safe sex.  Taking low doses of aspirin every day.  Taking vitamin and mineral supplements as recommended by your health care provider. What happens during an annual well check? The services and screenings done by your health care provider  during your annual well check will depend on your age, overall health, lifestyle risk factors, and family history of disease. Counseling  Your health care provider may ask you questions about your:  Alcohol use.  Tobacco use.  Drug use.  Emotional well-being.  Home and relationship well-being.  Sexual activity.  Eating habits.  History of falls.  Memory and ability to understand (cognition).  Work and work Statistician. Screening  You may have the following tests or measurements:  Height, weight, and BMI.  Blood pressure.  Lipid and cholesterol levels. These may be checked every 5 years, or more frequently if you are over 12 years old.  Skin check.  Lung cancer screening. You may have this screening every year starting at age 101 if you have a 30-pack-year history of smoking and currently smoke or have quit within the past 15 years.  Fecal occult blood test (FOBT) of the stool. You may have this test every year starting at age 58.  Flexible sigmoidoscopy or colonoscopy. You may have a sigmoidoscopy every 5 years or a colonoscopy every 10 years starting at age 60.  Prostate cancer screening. Recommendations will vary depending on your family history and other risks.  Hepatitis C blood test.  Hepatitis B blood test.  Sexually transmitted disease (STD) testing.  Diabetes screening. This is done by checking your blood sugar (glucose) after you have not eaten for a while (fasting). You may have this done every 1-3 years.  Abdominal aortic aneurysm (AAA) screening. You may need this if you are a current or former smoker.  Osteoporosis. You may be screened starting at age 50 if you are at high risk. Talk with your health  care provider about your test results, treatment options, and if necessary, the need for more tests. Vaccines  Your health care provider may recommend certain vaccines, such as:  Influenza vaccine. This is recommended every year.  Tetanus,  diphtheria, and acellular pertussis (Tdap, Td) vaccine. You may need a Td booster every 10 years.  Zoster vaccine. You may need this after age 42.  Pneumococcal 13-valent conjugate (PCV13) vaccine. One dose is recommended after age 68.  Pneumococcal polysaccharide (PPSV23) vaccine. One dose is recommended after age 44. Talk to your health care provider about which screenings and vaccines you need and how often you need them. This information is not intended to replace advice given to you by your health care provider. Make sure you discuss any questions you have with your health care provider. Document Released: 11/30/2015 Document Revised: 07/23/2016 Document Reviewed: 09/04/2015 Elsevier Interactive Patient Education  2017 [place] Prevention in the Home Falls can cause injuries. They can happen to people of all ages. There are many things you can do to make your home safe and to help prevent falls. What can I do on the outside of my home?  Regularly fix the edges of walkways and driveways and fix any cracks.  Remove anything that might make you trip as you walk through a door, such as a raised step or threshold.  Trim any bushes or trees on the path to your home.  Use bright outdoor lighting.  Clear any walking paths of anything that might make someone trip, such as rocks or tools.  Regularly check to see if handrails are loose or broken. Make sure that both sides of any steps have handrails.  Any raised decks and porches should have guardrails on the edges.  Have any leaves, snow, or ice cleared regularly.  Use sand or salt on walking paths during winter.  Clean up any spills in your garage right away. This includes oil or grease spills. What can I do in the bathroom?  Use night lights.  Install grab bars by the toilet and in the tub and shower. Do not use towel bars as grab bars.  Use non-skid mats or decals in the tub or shower.  If you need to sit down in  the shower, use a plastic, non-slip stool.  Keep the floor dry. Clean up any water that spills on the floor as soon as it happens.  Remove soap buildup in the tub or shower regularly.  Attach bath mats securely with double-sided non-slip rug tape.  Do not have throw rugs and other things on the floor that can make you trip. What can I do in the bedroom?  Use night lights.  Make sure that you have a light by your bed that is easy to reach.  Do not use any sheets or blankets that are too big for your bed. They should not hang down onto the floor.  Have a firm chair that has side arms. You can use this for support while you get dressed.  Do not have throw rugs and other things on the floor that can make you trip. What can I do in the kitchen?  Clean up any spills right away.  Avoid walking on wet floors.  Keep items that you use a lot in easy-to-reach places.  If you need to reach something above you, use a strong step stool that has a grab bar.  Keep electrical cords out of the way.  Do not use floor  polish or wax that makes floors slippery. If you must use wax, use non-skid floor wax.  Do not have throw rugs and other things on the floor that can make you trip. What can I do with my stairs?  Do not leave any items on the stairs.  Make sure that there are handrails on both sides of the stairs and use them. Fix handrails that are broken or loose. Make sure that handrails are as long as the stairways.  Check any carpeting to make sure that it is firmly attached to the stairs. Fix any carpet that is loose or worn.  Avoid having throw rugs at the top or bottom of the stairs. If you do have throw rugs, attach them to the floor with carpet tape.  Make sure that you have a light switch at the top of the stairs and the bottom of the stairs. If you do not have them, ask someone to add them for you. What else can I do to help prevent falls?  Wear shoes that:  Do not have high  heels.  Have rubber bottoms.  Are comfortable and fit you well.  Are closed at the toe. Do not wear sandals.  If you use a stepladder:  Make sure that it is fully opened. Do not climb a closed stepladder.  Make sure that both sides of the stepladder are locked into place.  Ask someone to hold it for you, if possible.  Clearly mark and make sure that you can see:  Any grab bars or handrails.  First and last steps.  Where the edge of each step is.  Use tools that help you move around (mobility aids) if they are needed. These include:  Canes.  Walkers.  Scooters.  Crutches.  Turn on the lights when you go into a dark area. Replace any light bulbs as soon as they burn out.  Set up your furniture so you have a clear path. Avoid moving your furniture around.  If any of your floors are uneven, fix them.  If there are any pets around you, be aware of where they are.  Review your medicines with your doctor. Some medicines can make you feel dizzy. This can increase your chance of falling. Ask your doctor what other things that you can do to help prevent falls. This information is not intended to replace advice given to you by your health care provider. Make sure you discuss any questions you have with your health care provider. Document Released: 08/30/2009 Document Revised: 04/10/2016 Document Reviewed: 12/08/2014 Elsevier Interactive Patient Education  2017 [employer].

## 2018-11-24 NOTE — Progress Notes (Signed)
Subjective:   Corey Daniel is a 73 y.o. male who presents for Medicare Annual/Subsequent preventive examination.  Review of Systems:   Cardiac Risk Factors include: advanced age (>73men, >39 women);male gender;hypertension;dyslipidemia     Objective:    Vitals: BP 122/78 (BP Location: Left Arm, Patient Position: Sitting, Cuff Size: Normal)   Pulse 72   Temp 98.4 F (36.9 C) (Oral)   Resp 16   Ht 5\' 10"  (1.778 m)   Wt 198 lb 9.6 oz (90.1 kg)   BMI 28.50 kg/m   Body mass index is 28.5 kg/m.  Advanced Directives 11/24/2018 10/19/2018 07/08/2018 04/13/2018 02/12/2018 01/12/2018 12/25/2017  Does Patient Have a Medical Advance Directive? No No Yes Yes Yes Yes Yes  Type of Advance Directive - Public librarian;Living will [place];Living will Living will;Healthcare Power of Attorney Living will;Healthcare Power of Attorney Living will  Does patient want to make changes to medical advance directive? Yes (MAU/Ambulatory/Procedural Areas - Information given) No - Patient declined No - Patient declined No - Patient declined No - Patient declined No - Patient declined No - Patient declined  Copy of Healthcare Power of Attorney in Chart? - - No - copy requested No - copy requested No - copy requested No - copy requested -  Would patient like information on creating a medical advance directive? - - No - Patient declined - - - No - Patient declined    Tobacco Social History   Tobacco Use  Smoking Status Former Smoker  . Packs/day: 0.50  . Years: 30.00  . Pack years: 15.00  . Types: Cigarettes  . Last attempt to quit: 1983  . Years since quitting: 37.0  Smokeless Tobacco Former Engineer, structural given: Not Answered   Clinical Intake:  Pre-visit preparation completed: Yes  Pain : 0-10 Pain Score: 2  Pain Type: Chronic pain Pain Location: Back Pain Orientation: Lower Pain Descriptors / Indicators: Sore Pain Onset: More than a month ago Pain  Frequency: Intermittent     Nutritional Status: BMI 25 -29 Overweight Nutritional Risks: None Diabetes: Yes(prediabetes) CBG done?: No Did pt. bring in CBG monitor from home?: No  How often do you need to have someone help you when you read instructions, pamphlets, or other written materials from your doctor or pharmacy?: 1 - Never What is the last grade level you completed in school?: 12th grade  Interpreter Needed?: No  Information entered by :: Clemetine Marker LPN  Past Medical History:  Diagnosis Date  . Arthritis   . ASCVD (arteriosclerotic cardiovascular disease)   . Back pain    with radiation  . Cancer ([place])    lymphoma  . Coronary artery disease   . Hyperlipidemia   . Hypertension   . IDA (iron deficiency anemia)   . Lower leg pain   . Myocardial infarction ([place])   . Prediabetes    Past Surgical History:  Procedure Laterality Date  . AUTOGRAFT STRUCTURAL ICBG OBTAINED SEPARATE INCISION FOR SPINE SURGERY   Bilateral 07/15/2013  . BACK SURGERY    . CATARACT EXTRACTION  2010  . COLONOSCOPY  06/2017  . CORONARY ARTERY BYPASS GRAFT    . IR IMAGING GUIDED PORT INSERTION  07/31/2017  . IR REMOVAL TUN ACCESS W/ PORT W/O FL MOD SED  02/12/2018  . LAMINECTOMY LUMBAR EXCISON/EVACUATION EXTRADURAL INTRASPINAL LESION Bilateral 07/15/2013     . LAMINOTOMY REEXPLORATION POSTERIOR LUMBAR W/NERVE DECOMP & DISCECTOMY Bilateral 07/15/2013     .  UPPER GI ENDOSCOPY  06/2017   Family History  Problem Relation Age of Onset  . Heart disease Mother   . Coarctation of the aorta Brother    Social History   Socioeconomic History  . Marital status: Married    Spouse name: Not on file  . Number of children: 1  . Years of education: Not on file  . Highest education level: High school graduate  Occupational History  . Occupation: retired  Scientific laboratory technician  . Financial resource strain: Not hard at all  . Food insecurity:    Worry: Never true    Inability: Never true  . Transportation  needs:    Medical: No    Non-medical: No  Tobacco Use  . Smoking status: Former Smoker    Packs/day: 0.50    Years: 30.00    Pack years: 15.00    Types: Cigarettes    Last attempt to quit: 1983    Years since quitting: 37.0  . Smokeless tobacco: Former Network engineer and Sexual Activity  . Alcohol use: No  . Drug use: No  . Sexual activity: Yes  Lifestyle  . Physical activity:    Days per week: 7 days    Minutes per session: 30 min  . Stress: Not at all  Relationships  . Social connections:    Talks on phone: Twice a week    Gets together: Once a week    Attends religious service: More than 4 times per year    Active member of club or organization: No    Attends meetings of clubs or organizations: Never    Relationship status: Married  Other Topics Concern  . Not on file  Social History Narrative  . Not on file    Outpatient Encounter Medications as of 11/24/2018  Medication Sig  . aspirin 81 MG chewable tablet Chew 81 mg by mouth daily.   . carvedilol (COREG) 3.125 MG tablet Take 1 tablet (3.125 mg total) by mouth 2 (two) times daily with a meal.  . Cholecalciferol (VITAMIN D3) 2000 UNITS capsule Take 2,000 Units by mouth daily.   . Coenzyme Q10 100 MG capsule Take by mouth.  . ezetimibe (ZETIA) 10 MG tablet Take 1 tablet (10 mg total) by mouth at bedtime.  . Fish Oil-Cholecalciferol (FISH OIL + D3) 1000-1000 MG-UNIT CAPS Take by mouth.  . fluticasone (FLONASE) 50 MCG/ACT nasal spray Place into the nose.  Marland Kitchen lisinopril (ZESTRIL) 2.5 MG tablet Take 1 tablet (2.5 mg total) by mouth daily.  . metFORMIN (GLUCOPHAGE) 500 MG tablet Take 500 mg by mouth daily with supper. Pt weaning himself off per instructions from Dr. Parks Ranger  . omeprazole (PRILOSEC) 40 MG capsule daily as needed.   . rosuvastatin (CRESTOR) 5 MG tablet Take 1 tablet (5 mg total) by mouth 3 (three) times a week.   No facility-administered encounter medications on file as of 11/24/2018.     Activities of  Daily Living In your present state of health, do you have any difficulty performing the following activities: 11/24/2018  Hearing? N  Comment declines hearing aids  Vision? N  Comment reading glasses  Difficulty concentrating or making decisions? N  Walking or climbing stairs? N  Dressing or bathing? N  Doing errands, shopping? N  Preparing Food and eating ? N  Using the Toilet? N  In the past six months, have you accidently leaked urine? N  Do you have problems with loss of bowel control? N  Managing your Medications?  N  Managing your Finances? N  Housekeeping or managing your Housekeeping? N  Some recent data might be hidden    Patient Care Team: Olin Hauser, DO as PCP - General (Family Medicine)   Assessment:   This is a routine wellness examination for Jim.  Exercise Activities and Dietary recommendations Current Exercise Habits: Home exercise routine, Type of exercise: walking, Time (Minutes): 30, Frequency (Times/Week): 7, Weekly Exercise (Minutes/Week): 210, Intensity: Mild, Exercise limited by: orthopedic condition(s)  Goals    . DIET - INCREASE WATER INTAKE     Recommend drinking at least 7-8 glasses of water a day     . Increase physical activity     Recommend increasing physical activity to 150 minutes per week        Fall Risk Fall Risk  11/24/2018 10/27/2018 09/03/2018 07/28/2018 03/26/2018  Falls in the past year? 0 0 No No No  Number falls in past yr: 1 - - - -  Comment one fall after back surgery - - - -  Injury with Fall? 0 - - - -  Risk for fall due to : History of fall(s) - - - -  Follow up Falls prevention discussed Falls evaluation completed - - -   FALL RISK PREVENTION PERTAINING TO THE HOME:  Any stairs in or around the home WITH handrails? No  Home free of loose throw rugs in walkways, pet beds, electrical cords, etc? Yes  Adequate lighting in your home to reduce risk of falls? Yes   ASSISTIVE DEVICES UTILIZED TO PREVENT  FALLS:  Life alert? No  Use of a cane, walker or w/c? No  Grab bars in the bathroom? No  Shower chair or bench in shower? No  Elevated toilet seat or a handicapped toilet? No   DME ORDERS:  DME order needed?  No   TIMED UP AND GO:  Was the test performed? Yes .  Length of time to ambulate 10 feet: 5 sec.   GAIT:  Appearance of gait: Gait stead-fast and without the use of an assistive device.  Education: Fall risk prevention has been discussed.  Intervention(s) required? No   Depression Screen PHQ 2/9 Scores 11/24/2018 10/27/2018 09/03/2018 07/28/2018  PHQ - 2 Score 0 0 0 0    Cognitive Function     6CIT Screen 11/24/2018 10/06/2017  What Year? 0 points 0 points  What month? 0 points 0 points  What time? 0 points 0 points  Count back from 20 0 points 0 points  Months in reverse 0 points 0 points  Repeat phrase 0 points 0 points  Total Score 0 0    Immunization History  Administered Date(s) Administered  . Hepatitis B 03/17/2004  . Influenza, High Dose Seasonal PF 08/08/2015, 08/07/2016, 07/22/2017, 10/27/2018  . Influenza-Unspecified 08/29/2013, 09/28/2014, 08/08/2015, 08/07/2016  . Pneumococcal Conjugate-13 09/28/2014  . Pneumococcal Polysaccharide-23 08/07/2016  . Tdap 04/17/2014    Qualifies for Shingles Vaccine? Yes . Due for Shingrix. Education has been provided regarding the importance of this vaccine. Pt has been advised to call insurance company to determine out of pocket expense. Advised may also receive vaccine at local pharmacy or Health Dept. Verbalized acceptance and understanding.  Tdap: Up to date   Flu Vaccine: Up to date  Pneumococcal Vaccine:Up to date   Screening Tests Health Maintenance  Topic Date Due  . COLONOSCOPY  06/23/2020  . TETANUS/TDAP  04/17/2024  . INFLUENZA VACCINE  Completed  . Hepatitis C Screening  Completed  .  PNA vac Low Risk Adult  Completed   Cancer Screenings:  Colorectal Screening: Completed 06/23/17. Repeat  every 3 years;   Lung Cancer Screening: (Low Dose CT Chest recommended if Age 54-80 years, 30 pack-year currently smoking OR have quit w/in 15years.) does not qualify.   Additional Screening:  Hepatitis C Screening: does not qualify; Completed 07/21/17  Vision Screening: Recommended annual ophthalmology exams for early detection of glaucoma and other disorders of the eye. Is the patient up to date with their annual eye exam?  No  Who is the provider or what is the name of the office in which the pt attends annual eye exams? Pt does not have an eye dr   Dental Screening: Recommended annual dental exams for proper oral hygiene  Community Resource Referral:  CRR required this visit?  No      Plan:    I have personally reviewed and addressed the Medicare Annual Wellness questionnaire and have noted the following in the patient's chart:  A. Medical and social history B. Use of alcohol, tobacco or illicit drugs  C. Current medications and supplements D. Functional ability and status E.  Nutritional status F.  Physical activity G. Advance directives H. List of other physicians I.  Hospitalizations, surgeries, and ER visits in previous 12 months J.  [place] such as hearing and vision if needed, cognitive and depression L. Referrals and appointments   In addition, I have reviewed and discussed with patient certain preventive protocols, quality metrics, and best practice recommendations. A written personalized care plan for preventive services as well as general preventive health recommendations were provided to patient.   Signed,  Clemetine Marker, LPN Nurse Health Advisor   Nurse Notes: none. Pt doing well and appreciative of visit today.

## 2018-12-07 DIAGNOSIS — L821 Other seborrheic keratosis: Secondary | ICD-10-CM | POA: Diagnosis not present

## 2018-12-07 DIAGNOSIS — Z08 Encounter for follow-up examination after completed treatment for malignant neoplasm: Secondary | ICD-10-CM | POA: Diagnosis not present

## 2018-12-07 DIAGNOSIS — L570 Actinic keratosis: Secondary | ICD-10-CM | POA: Diagnosis not present

## 2018-12-07 DIAGNOSIS — Z85828 Personal history of other malignant neoplasm of skin: Secondary | ICD-10-CM | POA: Diagnosis not present

## 2018-12-07 DIAGNOSIS — D225 Melanocytic nevi of trunk: Secondary | ICD-10-CM | POA: Diagnosis not present

## 2018-12-07 DIAGNOSIS — X32XXXA Exposure to sunlight, initial encounter: Secondary | ICD-10-CM | POA: Diagnosis not present

## 2018-12-07 DIAGNOSIS — D2262 Melanocytic nevi of left upper limb, including shoulder: Secondary | ICD-10-CM | POA: Diagnosis not present

## 2018-12-07 DIAGNOSIS — D2271 Melanocytic nevi of right lower limb, including hip: Secondary | ICD-10-CM | POA: Diagnosis not present

## 2018-12-07 DIAGNOSIS — D2261 Melanocytic nevi of right upper limb, including shoulder: Secondary | ICD-10-CM | POA: Diagnosis not present

## 2018-12-07 DIAGNOSIS — D2272 Melanocytic nevi of left lower limb, including hip: Secondary | ICD-10-CM | POA: Diagnosis not present

## 2018-12-09 DIAGNOSIS — Z981 Arthrodesis status: Secondary | ICD-10-CM | POA: Diagnosis not present

## 2018-12-21 ENCOUNTER — Other Ambulatory Visit: Payer: Self-pay | Admitting: *Deleted

## 2018-12-21 ENCOUNTER — Inpatient Hospital Stay: Payer: Medicare Other | Attending: Internal Medicine

## 2018-12-21 DIAGNOSIS — C8599 Non-Hodgkin lymphoma, unspecified, extranodal and solid organ sites: Secondary | ICD-10-CM | POA: Insufficient documentation

## 2018-12-21 DIAGNOSIS — D649 Anemia, unspecified: Secondary | ICD-10-CM | POA: Diagnosis not present

## 2018-12-21 LAB — CBC WITH DIFFERENTIAL/PLATELET
Abs Immature Granulocytes: 0.01 10*3/uL (ref 0.00–0.07)
Basophils Absolute: 0.1 10*3/uL (ref 0.0–0.1)
Basophils Relative: 1 %
Eosinophils Absolute: 0.2 10*3/uL (ref 0.0–0.5)
Eosinophils Relative: 4 %
HCT: 39.7 % (ref 39.0–52.0)
Hemoglobin: 13.1 g/dL (ref 13.0–17.0)
Immature Granulocytes: 0 %
Lymphocytes Relative: 16 %
Lymphs Abs: 0.7 10*3/uL (ref 0.7–4.0)
MCH: 28.9 pg (ref 26.0–34.0)
MCHC: 33 g/dL (ref 30.0–36.0)
MCV: 87.6 fL (ref 80.0–100.0)
MONOS PCT: 13 %
Monocytes Absolute: 0.6 10*3/uL (ref 0.1–1.0)
Neutro Abs: 2.9 10*3/uL (ref 1.7–7.7)
Neutrophils Relative %: 66 %
Platelets: 145 10*3/uL — ABNORMAL LOW (ref 150–400)
RBC: 4.53 MIL/uL (ref 4.22–5.81)
RDW: 13.4 % (ref 11.5–15.5)
WBC: 4.4 10*3/uL (ref 4.0–10.5)
nRBC: 0 % (ref 0.0–0.2)

## 2019-01-20 DIAGNOSIS — M4316 Spondylolisthesis, lumbar region: Secondary | ICD-10-CM | POA: Diagnosis not present

## 2019-01-20 DIAGNOSIS — Z981 Arthrodesis status: Secondary | ICD-10-CM | POA: Diagnosis not present

## 2019-01-20 DIAGNOSIS — M5136 Other intervertebral disc degeneration, lumbar region: Secondary | ICD-10-CM | POA: Diagnosis not present

## 2019-01-25 ENCOUNTER — Other Ambulatory Visit: Payer: Medicare Other

## 2019-01-25 DIAGNOSIS — R7303 Prediabetes: Secondary | ICD-10-CM

## 2019-01-25 DIAGNOSIS — I10 Essential (primary) hypertension: Secondary | ICD-10-CM | POA: Diagnosis not present

## 2019-01-25 DIAGNOSIS — E782 Mixed hyperlipidemia: Secondary | ICD-10-CM

## 2019-01-25 DIAGNOSIS — C8599 Non-Hodgkin lymphoma, unspecified, extranodal and solid organ sites: Secondary | ICD-10-CM | POA: Diagnosis not present

## 2019-01-25 DIAGNOSIS — Z862 Personal history of diseases of the blood and blood-forming organs and certain disorders involving the immune mechanism: Secondary | ICD-10-CM | POA: Diagnosis not present

## 2019-01-26 LAB — CBC WITH DIFFERENTIAL/PLATELET
Absolute Monocytes: 576 cells/uL (ref 200–950)
Basophils Absolute: 69 cells/uL (ref 0–200)
Basophils Relative: 1.6 %
EOS ABS: 142 {cells}/uL (ref 15–500)
Eosinophils Relative: 3.3 %
HCT: 44.2 % (ref 38.5–50.0)
Hemoglobin: 14.2 g/dL (ref 13.2–17.1)
Lymphs Abs: 718 cells/uL — ABNORMAL LOW (ref 850–3900)
MCH: 27.5 pg (ref 27.0–33.0)
MCHC: 32.1 g/dL (ref 32.0–36.0)
MCV: 85.7 fL (ref 80.0–100.0)
MPV: 11.3 fL (ref 7.5–12.5)
Monocytes Relative: 13.4 %
NEUTROS PCT: 65 %
Neutro Abs: 2795 cells/uL (ref 1500–7800)
Platelets: 162 10*3/uL (ref 140–400)
RBC: 5.16 10*6/uL (ref 4.20–5.80)
RDW: 14.2 % (ref 11.0–15.0)
Total Lymphocyte: 16.7 %
WBC: 4.3 10*3/uL (ref 3.8–10.8)

## 2019-01-26 LAB — COMPLETE METABOLIC PANEL WITH GFR
AG Ratio: 1.7 (calc) (ref 1.0–2.5)
ALT: 12 U/L (ref 9–46)
AST: 19 U/L (ref 10–35)
Albumin: 4.5 g/dL (ref 3.6–5.1)
Alkaline phosphatase (APISO): 67 U/L (ref 35–144)
BUN: 25 mg/dL (ref 7–25)
CALCIUM: 9.9 mg/dL (ref 8.6–10.3)
CO2: 23 mmol/L (ref 20–32)
Chloride: 105 mmol/L (ref 98–110)
Creat: 0.98 mg/dL (ref 0.70–1.18)
GFR, Est African American: 88 mL/min/{1.73_m2} (ref >=60)
GFR, Est Non African American: 76 mL/min/{1.73_m2} (ref >=60)
Globulin: 2.6 g/dL (calc) (ref 1.9–3.7)
Glucose, Bld: 107 mg/dL — ABNORMAL HIGH (ref 65–99)
Potassium: 4.6 mmol/L (ref 3.5–5.3)
Sodium: 138 mmol/L (ref 135–146)
Total Bilirubin: 0.5 mg/dL (ref 0.2–1.2)
Total Protein: 7.1 g/dL (ref 6.1–8.1)

## 2019-01-26 LAB — LIPID PANEL
Cholesterol: 154 mg/dL (ref <200)
HDL: 48 mg/dL (ref >=40)
LDL Cholesterol (Calc): 85 mg/dL (calc)
Non-HDL Cholesterol (Calc): 106 mg/dL (calc) (ref <130)
Total CHOL/HDL Ratio: 3.2 (calc) (ref <5.0)
Triglycerides: 110 mg/dL (ref <150)

## 2019-01-26 LAB — HEMOGLOBIN A1C
Hgb A1c MFr Bld: 6.4 % of total Hgb — ABNORMAL HIGH (ref <5.7)
Mean Plasma Glucose: 137 (calc)
eAG (mmol/L): 7.6 (calc)

## 2019-02-01 ENCOUNTER — Encounter: Payer: Self-pay | Admitting: Family Medicine

## 2019-02-01 ENCOUNTER — Ambulatory Visit (INDEPENDENT_AMBULATORY_CARE_PROVIDER_SITE_OTHER): Payer: Medicare Other | Admitting: Family Medicine

## 2019-02-01 ENCOUNTER — Other Ambulatory Visit: Payer: Self-pay

## 2019-02-01 VITALS — BP 108/71 | HR 66 | Temp 98.1°F | Resp 16 | Ht 70.0 in | Wt 199.2 lb

## 2019-02-01 DIAGNOSIS — I700 Atherosclerosis of aorta: Secondary | ICD-10-CM | POA: Diagnosis not present

## 2019-02-01 DIAGNOSIS — R7303 Prediabetes: Secondary | ICD-10-CM | POA: Diagnosis not present

## 2019-02-01 DIAGNOSIS — C8599 Non-Hodgkin lymphoma, unspecified, extranodal and solid organ sites: Secondary | ICD-10-CM

## 2019-02-01 DIAGNOSIS — D696 Thrombocytopenia, unspecified: Secondary | ICD-10-CM | POA: Diagnosis not present

## 2019-02-01 DIAGNOSIS — E782 Mixed hyperlipidemia: Secondary | ICD-10-CM

## 2019-02-01 DIAGNOSIS — I10 Essential (primary) hypertension: Secondary | ICD-10-CM

## 2019-02-01 MED ORDER — METFORMIN HCL ER 500 MG PO TB24: 500.0000 mg | ORAL_TABLET | Freq: Every day | ORAL | 1 refills | Status: DC

## 2019-02-01 NOTE — Assessment & Plan Note (Signed)
Stable currently in remission, without evidence of recurrence Followed by [facility] Dr Brahmanday, s/p Chemotherapy Last PET done 2019 negative  Resolved thrombocytopenia / anemia 

## 2019-02-01 NOTE — Assessment & Plan Note (Signed)
Secondary vascular complication of Hyperlipidemia among other risk factors Known CAD  Improved lipids on intermittent statin and Zetia

## 2019-02-01 NOTE — Assessment & Plan Note (Signed)
Significantly elevated A1c up to 6.4 now, from previous 5.4, question accuracy of both test since such dramatic change despite still improved lifestyle and diet / exercise - he has remained on low dose metformin 500mg  daily still did not stop Concern with HTN, HLD, CAD  Plan:  1. SWITCH Metformin IR 500mg  daily over to XR 500mg  daily - 24 hr - new rx sent to mail order 2. Encourage improved lifestyle - keto / low carb diet, keep improving exercise 3. Follow-up 3 months w/ POC A1c for compare - then again 07/2019

## 2019-02-01 NOTE — Assessment & Plan Note (Addendum)
Resolved on last lab CBC 01/2019 Secondary to Lymphoma, now in remission Surveillance by Hematology

## 2019-02-01 NOTE — Progress Notes (Signed)
Subjective:    Patient ID: Corey Daniel, male    DOB: 1946-08-19, 73 y.o.   MRN: [id number]  Corey Daniel is a 73 y.o. male presenting on 02/01/2019 for Hypertension   HPI   Pre-Diabetes: Previous trend with A1c mid 6s up to 6.8. He has dramatically improved lifestyle down to A1c 5.4, now recent lab shows elevated again A1c up to 6.4, he had tapered down on Metformin from 500 BID to once daily still CBGs: Not checking CBG Meds: Metformin 500mg  daily Tolerating medicine  Currently on ACEi Lifestyle: - Weightup 1-2 lbs in past 3 months - Diet (Significantly improved to low carb vs keto diet) - Exercise (Improving exercise still, gradual improve again after exercise - walking 2 miles every day) Denies hypoglycemia, polyuria, visual changes, numbness or tingling.  CHRONIC HTN: Reports no concerns, not checking BP at home Current Meds - Lisinopril 2.5mg  daily, Carvedilol 3.125mg  BID Reports good compliance, took meds today. Tolerating well, w/o complaints. Denies CP, dyspnea, HA, edema, dizziness / lightheadedness  HYPERLIPIDEMIA: - Reports no concerns. Last lipid panel 01/2019 - significantly improved reduced LDL 80s now - Currently taking Rosuvastatin intermittent Mon-Weds-Fri and Zetia, tolerating well without side effects or myalgias  Follow-up Lymphoma Gastric (Diffuse Large B-Cell Lymphoma) / Thrombocytopenia Followed by Heme Onc Dr Rogue Bussing, last seen 10/2018, has been stable without recurrence, s/p chemo and radiation, on imaging. Had prior anemia improved also thrombocytopenia has improved on last CBC drawn 01/2019.  Health Maintenance: UTD Flu vaccine   Depression screen [facility] 2/9 02/01/2019 11/24/2018 10/27/2018  Decreased Interest 0 0 0  Down, Depressed, Hopeless 0 0 0  PHQ - 2 Score 0 0 0    Social History   Tobacco Use   Smoking status: Former Smoker    Packs/day: 0.50    Years: 30.00    Pack years: 15.00    Types: Cigarettes    Last attempt to quit:  1983    Years since quitting: 37.2   Smokeless tobacco: Former Systems developer  Substance Use Topics   Alcohol use: No   Drug use: No    Review of Systems Per HPI unless specifically indicated above     Objective:    BP 108/71    Pulse 66    Temp 98.1 F (36.7 C) (Oral)    Resp 16    Ht 5\' 10"  (1.778 m)    Wt 199 lb 3.2 oz (90.4 kg)    BMI 28.58 kg/m   Wt Readings from Last 3 Encounters:  02/01/19 199 lb 3.2 oz (90.4 kg)  11/24/18 198 lb 9.6 oz (90.1 kg)  10/27/18 197 lb (89.4 kg)    Physical Exam Vitals signs and nursing note reviewed.  Constitutional:      General: He is not in acute distress.    Appearance: He is well-developed. He is not diaphoretic.     Comments: Well-appearing, comfortable, cooperative  HENT:     Head: Normocephalic and atraumatic.  Eyes:     General:        Right eye: No discharge.        Left eye: No discharge.     Conjunctiva/sclera: Conjunctivae normal.  Cardiovascular:     Rate and Rhythm: Normal rate.  Pulmonary:     Effort: Pulmonary effort is normal.  Skin:    General: Skin is warm and dry.     Findings: No erythema or rash.  Neurological:     Mental Status: He is alert and oriented to  person, place, and time.  Psychiatric:        Behavior: Behavior normal.     Comments: Well groomed, good eye contact, normal speech and thoughts    Results for orders placed or performed in visit on 01/25/19  Lipid panel  Result Value Ref Range   Cholesterol 154 <200 mg/dL   HDL 48 > OR = 40 mg/dL   Triglycerides 110 <150 mg/dL   LDL Cholesterol (Calc) 85 mg/dL (calc)   Total CHOL/HDL Ratio 3.2 <5.0 (calc)   Non-HDL Cholesterol (Calc) 106 <130 mg/dL (calc)  COMPLETE METABOLIC PANEL WITH GFR  Result Value Ref Range   Glucose, Bld 107 (H) 65 - 99 mg/dL   BUN 25 7 - 25 mg/dL   Creat 0.98 0.70 - 1.18 mg/dL   GFR, Est Non African American 76 > OR = 60 mL/min/1.33m2   GFR, Est African American 88 > OR = 60 mL/min/1.12m2   BUN/Creatinine Ratio NOT  APPLICABLE 6 - 22 (calc)   Sodium 138 135 - 146 mmol/L   Potassium 4.6 3.5 - 5.3 mmol/L   Chloride 105 98 - 110 mmol/L   CO2 23 20 - 32 mmol/L   Calcium 9.9 8.6 - 10.3 mg/dL   Total Protein 7.1 6.1 - 8.1 g/dL   Albumin 4.5 3.6 - 5.1 g/dL   Globulin 2.6 1.9 - 3.7 g/dL (calc)   AG Ratio 1.7 1.0 - 2.5 (calc)   Total Bilirubin 0.5 0.2 - 1.2 mg/dL   Alkaline phosphatase (APISO) 67 35 - 144 U/L   AST 19 10 - 35 U/L   ALT 12 9 - 46 U/L  CBC with Differential/Platelet  Result Value Ref Range   WBC 4.3 3.8 - 10.8 Thousand/uL   RBC 5.16 4.20 - 5.80 Million/uL   Hemoglobin 14.2 13.2 - 17.1 g/dL   HCT 44.2 38.5 - 50.0 %   MCV 85.7 80.0 - 100.0 fL   MCH 27.5 27.0 - 33.0 pg   MCHC 32.1 32.0 - 36.0 g/dL   RDW 14.2 11.0 - 15.0 %   Platelets 162 140 - 400 Thousand/uL   MPV 11.3 7.5 - 12.5 fL   Neutro Abs 2,795 1,500 - 7,800 cells/uL   Lymphs Abs 718 (L) 850 - 3,900 cells/uL   Absolute Monocytes 576 200 - 950 cells/uL   Eosinophils Absolute 142 15 - 500 cells/uL   Basophils Absolute 69 0 - 200 cells/uL   Neutrophils Relative % 65 %   Total Lymphocyte 16.7 %   Monocytes Relative 13.4 %   Eosinophils Relative 3.3 %   Basophils Relative 1.6 %  Hemoglobin A1c  Result Value Ref Range   Hgb A1c MFr Bld 6.4 (H) <5.7 % of total Hgb   Mean Plasma Glucose 137 (calc)   eAG (mmol/L) 7.6 (calc)      Assessment & Plan:   Problem List Items Addressed This Visit    Atherosclerosis of aorta (HCC)    Secondary vascular complication of Hyperlipidemia among other risk factors Known CAD  Improved lipids on intermittent statin and Zetia      Essential hypertension    Controlled HTN on low dose meds Complicated by CAD    Plan:  1. Continue current BP regimen Coreg 3.125mg  BID, Lisinopril 2.5mg  2. Encourage improved lifestyle - low sodium diet, regular exercise 3. Continue monitor BP outside office, bring readings to next visit, if persistently >140/90 or new symptoms notify office sooner       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[place])  Stable currently in remission, without evidence of recurrence Followed by [facility] Dr Rogue Bussing, s/p Chemotherapy Last PET done 2019 negative  Resolved thrombocytopenia / anemia      Mixed hyperlipidemia    Controlled cholesterol on intermittent statin / zetia and lifestyle Last lipid panel 01/2019 With CAD  Plan: 1. Continue current meds - Rosuvastatin 5mg  - 3 times week - tolerating well, with Zetia 2. Continue ASA 81mg  for secondary ASCVD risk reduction 3. Encourage improved lifestyle - low carb/cholesterol, reduce portion size, continue improving regular exercise      Pre-diabetes - Primary    Significantly elevated A1c up to 6.4 now, from previous 5.4, question accuracy of both test since such dramatic change despite still improved lifestyle and diet / exercise - he has remained on low dose metformin 500mg  daily still did not stop Concern with HTN, HLD, CAD  Plan:  1. SWITCH Metformin IR 500mg  daily over to XR 500mg  daily - 24 hr - new rx sent to mail order 2. Encourage improved lifestyle - keto / low carb diet, keep improving exercise 3. Follow-up 3 months w/ POC A1c for compare - then again 07/2019      Relevant Medications   metFORMIN (GLUCOPHAGE-XR) 500 MG 24 hr tablet   Thrombocytopenia (HCC)    Resolved on last lab CBC 01/2019 Secondary to Lymphoma, now in remission Surveillance by Hematology         Meds ordered this encounter  Medications   metFORMIN (GLUCOPHAGE-XR) 500 MG 24 hr tablet    Sig: Take 1 tablet (500 mg total) by mouth daily with breakfast.    Dispense:  90 tablet    Refill:  1    Change from metformin IR to XR    Follow up plan: Return in about 3 months (around 05/04/2019) for PreDM A1c.  [name], [facility] 02/01/2019, 8:18 AM

## 2019-02-01 NOTE — Patient Instructions (Addendum)
Thank you for coming to the office today.  Recent Labs    07/23/18 0826 10/27/18 0845 01/25/19 0759  HGBA1C 6.8* 5.4 6.4*    SWITCH Metformin 500 over to the extended release version - Metformin XR - 500mg  once daily with food - call your [facility] pharmacy to confirm that they send you the right one, and that they delete the previous old Meftormin rx  Cholesterol is excellent, great job, medicine is working well. No changes.   Please schedule a Follow-up Appointment to: Return in about 3 months (around 05/04/2019) for PreDM A1c.  If you have any other questions or concerns, please feel free to call the office or send a message through [place]. You may also schedule an earlier appointment if necessary.  Additionally, you may be receiving a survey about your experience at our office within a few days to 1 week by e-mail or mail. We value your feedback.  [name], DO [place]

## 2019-02-01 NOTE — Assessment & Plan Note (Signed)
Controlled cholesterol on intermittent statin / zetia and lifestyle Last lipid panel 01/2019 With CAD  Plan: 1. Continue current meds - Rosuvastatin 5mg  - 3 times week - tolerating well, with Zetia 2. Continue ASA 81mg  for secondary ASCVD risk reduction 3. Encourage improved lifestyle - low carb/cholesterol, reduce portion size, continue improving regular exercise

## 2019-02-01 NOTE — Assessment & Plan Note (Signed)
Controlled HTN on low dose meds  Complicated by CAD    Plan:  1. Continue current BP regimen Coreg 3.125mg BID, Lisinopril 2.5mg 2. Encourage improved lifestyle - low sodium diet, regular exercise 3. Continue monitor BP outside office, bring readings to next visit, if persistently >140/90 or new symptoms notify office sooner 

## 2019-02-16 DIAGNOSIS — I2510 Atherosclerotic heart disease of native coronary artery without angina pectoris: Secondary | ICD-10-CM | POA: Diagnosis not present

## 2019-02-16 DIAGNOSIS — I10 Essential (primary) hypertension: Secondary | ICD-10-CM | POA: Diagnosis not present

## 2019-02-16 DIAGNOSIS — E119 Type 2 diabetes mellitus without complications: Secondary | ICD-10-CM | POA: Diagnosis not present

## 2019-04-13 ENCOUNTER — Other Ambulatory Visit: Payer: Self-pay | Admitting: Family Medicine

## 2019-04-13 DIAGNOSIS — I2510 Atherosclerotic heart disease of native coronary artery without angina pectoris: Secondary | ICD-10-CM

## 2019-04-13 DIAGNOSIS — I10 Essential (primary) hypertension: Secondary | ICD-10-CM

## 2019-04-18 ENCOUNTER — Other Ambulatory Visit: Payer: Self-pay | Admitting: *Deleted

## 2019-04-18 DIAGNOSIS — C8599 Non-Hodgkin lymphoma, unspecified, extranodal and solid organ sites: Secondary | ICD-10-CM

## 2019-04-19 ENCOUNTER — Other Ambulatory Visit: Payer: Self-pay

## 2019-04-20 ENCOUNTER — Inpatient Hospital Stay: Payer: Medicare Other | Attending: Internal Medicine | Admitting: Internal Medicine

## 2019-04-20 ENCOUNTER — Other Ambulatory Visit: Payer: Self-pay | Admitting: *Deleted

## 2019-04-20 ENCOUNTER — Inpatient Hospital Stay: Payer: Medicare Other

## 2019-04-20 ENCOUNTER — Other Ambulatory Visit: Payer: Self-pay

## 2019-04-20 DIAGNOSIS — R911 Solitary pulmonary nodule: Secondary | ICD-10-CM | POA: Diagnosis not present

## 2019-04-20 DIAGNOSIS — I2510 Atherosclerotic heart disease of native coronary artery without angina pectoris: Secondary | ICD-10-CM

## 2019-04-20 DIAGNOSIS — Z8572 Personal history of non-Hodgkin lymphomas: Secondary | ICD-10-CM | POA: Diagnosis not present

## 2019-04-20 DIAGNOSIS — C8599 Non-Hodgkin lymphoma, unspecified, extranodal and solid organ sites: Secondary | ICD-10-CM | POA: Diagnosis not present

## 2019-04-20 LAB — CBC WITH DIFFERENTIAL/PLATELET
Abs Immature Granulocytes: 0.03 10*3/uL (ref 0.00–0.07)
Basophils Absolute: 0.1 10*3/uL (ref 0.0–0.1)
Basophils Relative: 1 %
Eosinophils Absolute: 0.2 10*3/uL (ref 0.0–0.5)
Eosinophils Relative: 5 %
HCT: 38.6 % — ABNORMAL LOW (ref 39.0–52.0)
Hemoglobin: 13 g/dL (ref 13.0–17.0)
Immature Granulocytes: 1 %
Lymphocytes Relative: 21 %
Lymphs Abs: 0.9 10*3/uL (ref 0.7–4.0)
MCH: 29.9 pg (ref 26.0–34.0)
MCHC: 33.7 g/dL (ref 30.0–36.0)
MCV: 88.7 fL (ref 80.0–100.0)
Monocytes Absolute: 0.5 10*3/uL (ref 0.1–1.0)
Monocytes Relative: 12 %
Neutro Abs: 2.8 10*3/uL (ref 1.7–7.7)
Neutrophils Relative %: 60 %
Platelets: 137 10*3/uL — ABNORMAL LOW (ref 150–400)
RBC: 4.35 MIL/uL (ref 4.22–5.81)
RDW: 14.4 % (ref 11.5–15.5)
WBC: 4.5 10*3/uL (ref 4.0–10.5)
nRBC: 0 % (ref 0.0–0.2)

## 2019-04-20 LAB — COMPREHENSIVE METABOLIC PANEL
ALT: 16 U/L (ref 0–44)
AST: 21 U/L (ref 15–41)
Albumin: 4 g/dL (ref 3.5–5.0)
Alkaline Phosphatase: 67 U/L (ref 38–126)
Anion gap: 9 (ref 5–15)
BUN: 19 mg/dL (ref 8–23)
CO2: 22 mmol/L (ref 22–32)
Calcium: 8.6 mg/dL — ABNORMAL LOW (ref 8.9–10.3)
Chloride: 108 mmol/L (ref 98–111)
Creatinine, Ser: 1.08 mg/dL (ref 0.61–1.24)
GFR calc Af Amer: >60 mL/min (ref >60)
GFR calc non Af Amer: >60 mL/min (ref >60)
Glucose, Bld: 129 mg/dL — ABNORMAL HIGH (ref 70–99)
Potassium: 3.8 mmol/L (ref 3.5–5.1)
Sodium: 139 mmol/L (ref 135–145)
Total Bilirubin: 0.9 mg/dL (ref 0.3–1.2)
Total Protein: 6.9 g/dL (ref 6.5–8.1)

## 2019-04-20 LAB — LACTATE DEHYDROGENASE: LDH: 139 U/L (ref 98–192)

## 2019-04-20 NOTE — Progress Notes (Signed)
I connected with Corey Daniel on 04/20/19 at 11:30 AM EDT by video enabled telemedicine visit and verified that I am speaking with the correct person using two identifiers.  I discussed the limitations, risks, security and privacy concerns of performing an evaluation and management service by telemedicine and the availability of in-person appointments. I also discussed with the patient that there may be a patient responsible charge related to this service. The patient expressed understanding and agreed to proceed.    Other persons participating in the visit and their role in the encounter: RN/ medical reconcilation  Patient's location: Home  Provider's location: Office   Oncology History   # AUG 2018- DIFFUSE LARGE B CELL LYMPHOMA STOMACH-STAGE IE [BMBx-NEG]Fundus ;CD-20 pos;variable- bcl6/mum-?? GCB vs. ABC subtype;  [EGD for IDA; Dr.Hung; [place]]- NEG for FISH for;myc; bcl-6; bcl-2;11:14; MALT [8q21];NEG for CD-10; ki-67-70%; SEP PET- uptake in stomach. Hepatitis panel-NEG; BMBx-NEG  # SEP 25th 2018- R-CHOP x3 ; RT [until jan 3rd 2019]; PET Feb 2019- CR; s/p EGD- NEG [March 2019]  # AUG 2018- H.pylori positive/stomach body.   # IDA-resolved  # CAD [s/p CABG; Dr.fath];MUGA scan [sep 2018]- 56.5%; port explanted.  ---------------------------------------------------------------   DIAGNOSIS: [ aug 2018]- DLBCL OD STOMACH  STAGE: IE ; GOALS: curative  CURRENT/MOST RECENT THERAPY;  surveillance      Gastric lymphoma [facility])     Chief Complaint: gastric Lymphoma.     History of present illness:Corey Daniel 73 y.o.  male with history of gastric lymphoma is here for follow-up.  Patient denies any weight loss.  Denies any loss of appetite.  No nausea no vomiting.  No chest pain or shortness of the cough.  Continues to have chronic back pain.  Not any worse.  However he has been fairly active-contributing to his back pain.  Denies any tingling or numbness.  Denies any blood in stools or  black or stools.  Observation/objective:  Assessment and plan: 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[place]) #Diffuse large B-cell lymphoma of the stomach; stage I E.  Status post chemotherapy followed by radiation; February 2019 PET scan CR; upper endoscopy March 2019 normal as per patient.   #Clinically, no evidence of recurrence.  Continue surveillance every 6 months on a clinical basis without any imaging  #Mild anemia-currently hemoglobin 13.  Resolved.  #Chronic back pain s/p back surgery; stable.  Awaiting evaluation with his orthopedic doctor.  # Right lung 5 mm nodule [incidental]; Chest x-ray December 2019 -negative.  Repeat chest x-ray now.  # DISPOSITION:  # CXR in [place] # follow up in 6 months- MD-labs-cbc/cmp/ldh; Dr.B   Follow-up instructions:  I discussed the assessment and treatment plan with the patient.  The patient was provided an opportunity to ask questions and all were answered.  The patient agreed with the plan and demonstrated understanding of instructions.  The patient was advised to call back or seek an in person evaluation if the symptoms worsen or if the condition fails to improve as anticipated.  Dr. Charlaine Dalton [facility] at [facility] 04/20/2019 12:06 PM

## 2019-04-20 NOTE — Assessment & Plan Note (Addendum)
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  Status post chemotherapy followed by radiation; February 2019 PET scan CR; upper endoscopy March 2019 normal as per patient.   #Clinically, no evidence of recurrence.  Continue surveillance every 6 months on a clinical basis without any imaging  #Mild anemia-currently hemoglobin 13.  Resolved.  #Chronic back pain s/p back surgery; stable.  Awaiting evaluation with his orthopedic doctor.  # Right lung 5 mm nodule [incidental]; Chest x-ray December 2019 -negative.  Repeat chest x-ray now.  # DISPOSITION:  # CXR in [place] # follow up in 6 months- MD-labs-cbc/cmp/ldh; Dr.B

## 2019-04-21 DIAGNOSIS — Z981 Arthrodesis status: Secondary | ICD-10-CM | POA: Diagnosis not present

## 2019-04-21 DIAGNOSIS — M5136 Other intervertebral disc degeneration, lumbar region: Secondary | ICD-10-CM | POA: Diagnosis not present

## 2019-04-22 ENCOUNTER — Other Ambulatory Visit: Payer: Self-pay

## 2019-04-22 ENCOUNTER — Ambulatory Visit
Admission: RE | Admit: 2019-04-22 | Discharge: 2019-04-22 | Disposition: A | Discharge status: Home / Self Care | Payer: Medicare Other | Source: Ambulatory Visit | Attending: Internal Medicine | Admitting: Internal Medicine

## 2019-04-22 ENCOUNTER — Ambulatory Visit
Admission: RE | Admit: 2019-04-22 | Discharge: 2019-04-22 | Disposition: A | Discharge status: Home / Self Care | Payer: Medicare Other | Attending: Internal Medicine | Admitting: Internal Medicine

## 2019-04-22 DIAGNOSIS — R911 Solitary pulmonary nodule: Secondary | ICD-10-CM

## 2019-04-22 DIAGNOSIS — C8599 Non-Hodgkin lymphoma, unspecified, extranodal and solid organ sites: Secondary | ICD-10-CM | POA: Insufficient documentation

## 2019-04-30 ENCOUNTER — Other Ambulatory Visit: Payer: Self-pay | Admitting: Family Medicine

## 2019-04-30 DIAGNOSIS — I10 Essential (primary) hypertension: Secondary | ICD-10-CM

## 2019-05-09 ENCOUNTER — Other Ambulatory Visit: Payer: Self-pay | Admitting: Family Medicine

## 2019-05-09 DIAGNOSIS — R7303 Prediabetes: Secondary | ICD-10-CM

## 2019-05-09 DIAGNOSIS — E782 Mixed hyperlipidemia: Secondary | ICD-10-CM

## 2019-05-09 MED ORDER — ROSUVASTATIN CALCIUM 5 MG PO TABS: 5.0000 mg | ORAL_TABLET | ORAL | 3 refills | Status: DC

## 2019-05-09 MED ORDER — METFORMIN HCL ER 500 MG PO TB24: 500.0000 mg | ORAL_TABLET | Freq: Every day | ORAL | 3 refills | Status: DC

## 2019-05-09 NOTE — Telephone Encounter (Signed)
The pt stated that he's going to first call his pharmacy to verify if they switched manufacturers for the Metformin XR, because the letter stated that not all manufacturers were effected. He will call us back and notify us if he needs a refill or his medication changed over.

## 2019-05-09 NOTE — Addendum Note (Signed)
Addended by: Olin Hauser on: 05/09/2019 12:14 PM   Modules accepted: Orders

## 2019-05-09 NOTE — Telephone Encounter (Signed)
Pt called said that his metformin was recalled wanted another medication  Called in.  He requested that Dr.K  call him [phone]

## 2019-05-09 NOTE — Telephone Encounter (Signed)
Metformin XR extended release was recalled.  Recommended to switch to Metformin (immediate release) regular metformin.  I can send new rx of 500mg  once daily with largest meal of day if he agrees. If not strong enough may need to take 500mg  twice a day in future.  Let me know  [name], [facility] 05/09/2019, 10:37 AM

## 2019-05-09 NOTE — Telephone Encounter (Signed)
The pt would like to continue on the Metformin XR. He was informed that they switched manufacturer.

## 2019-05-16 ENCOUNTER — Other Ambulatory Visit: Payer: Self-pay | Admitting: Family Medicine

## 2019-05-16 ENCOUNTER — Ambulatory Visit (INDEPENDENT_AMBULATORY_CARE_PROVIDER_SITE_OTHER): Payer: Medicare Other | Admitting: Family Medicine

## 2019-05-16 ENCOUNTER — Encounter: Payer: Self-pay | Admitting: Family Medicine

## 2019-05-16 ENCOUNTER — Other Ambulatory Visit: Payer: Self-pay

## 2019-05-16 VITALS — BP 146/79 | HR 70 | Temp 98.1°F | Resp 16 | Ht 70.0 in | Wt 206.0 lb

## 2019-05-16 DIAGNOSIS — C8599 Non-Hodgkin lymphoma, unspecified, extranodal and solid organ sites: Secondary | ICD-10-CM

## 2019-05-16 DIAGNOSIS — R351 Nocturia: Secondary | ICD-10-CM

## 2019-05-16 DIAGNOSIS — R7303 Prediabetes: Secondary | ICD-10-CM | POA: Diagnosis not present

## 2019-05-16 DIAGNOSIS — E782 Mixed hyperlipidemia: Secondary | ICD-10-CM

## 2019-05-16 DIAGNOSIS — D696 Thrombocytopenia, unspecified: Secondary | ICD-10-CM

## 2019-05-16 DIAGNOSIS — I10 Essential (primary) hypertension: Secondary | ICD-10-CM

## 2019-05-16 LAB — POCT GLYCOSYLATED HEMOGLOBIN (HGB A1C): Hemoglobin A1C: 6.1 % — AB (ref 4.0–5.6)

## 2019-05-16 NOTE — Progress Notes (Signed)
Subjective:    Patient ID: Corey Daniel, male    DOB: 1946/08/23, 73 y.o.   MRN: [id number]  Dorrien Grunder is a 73 y.o. male presenting on 05/16/2019 for Pre-Diabetes   HPI   Pre-Diabetes: Previous trend with A1c ranging 5.4 to 6.4 in recent past, question if 6.4 was accurate, he has not majorly changed lifestyle. CBGs: Not checking CBG Meds:Metformin XR 500mg  daily - was recalled then manufacturer changed, so he is back on med Tolerating medicine Currently on ACEi Lifestyle: - Weightup 5 lbs since last visit - Diet (Significantly improved to low carb vs keto diet) - Exercise (Improving exercisestill, gradual improve again after exercise, walking) Denies hypoglycemia, polyuria, visual changes, numbness or tingling.   Depression screen [facility] 2/9 05/16/2019 02/01/2019 11/24/2018  Decreased Interest 0 0 0  Down, Depressed, Hopeless 0 0 0  PHQ - 2 Score 0 0 0    Social History   Tobacco Use  . Smoking status: Former Smoker    Packs/day: 0.50    Years: 30.00    Pack years: 15.00    Types: Cigarettes    Quit date: 1983    Years since quitting: 37.5  . Smokeless tobacco: Former Network engineer Use Topics  . Alcohol use: No  . Drug use: No    Review of Systems Per HPI unless specifically indicated above     Objective:    BP (!) 146/79   Pulse 70   Temp 98.1 F (36.7 C) (Oral)   Resp 16   Ht 5\' 10"  (1.778 m)   Wt 206 lb (93.4 kg)   BMI 29.56 kg/m   Wt Readings from Last 3 Encounters:  05/16/19 206 lb (93.4 kg)  02/01/19 199 lb 3.2 oz (90.4 kg)  11/24/18 198 lb 9.6 oz (90.1 kg)    Physical Exam Vitals signs and nursing note reviewed.  Constitutional:      General: He is not in acute distress.    Appearance: He is well-developed. He is not diaphoretic.     Comments: Well-appearing, comfortable, cooperative  HENT:     Head: Normocephalic and atraumatic.  Eyes:     General:        Right eye: No discharge.        Left eye: No discharge.   Conjunctiva/sclera: Conjunctivae normal.  Cardiovascular:     Rate and Rhythm: Normal rate.  Pulmonary:     Effort: Pulmonary effort is normal.  Skin:    General: Skin is warm and dry.     Findings: No erythema or rash.  Neurological:     Mental Status: He is alert and oriented to person, place, and time.  Psychiatric:        Behavior: Behavior normal.     Comments: Well groomed, good eye contact, normal speech and thoughts      Recent Labs    10/27/18 0845 01/25/19 0759 05/16/19 1126  HGBA1C 5.4 6.4* 6.1*    Results for orders placed or performed in visit on 05/16/19  POCT HgB A1C  Result Value Ref Range   Hemoglobin A1C 6.1 (A) 4.0 - 5.6 %   Recent Labs    10/27/18 0845 01/25/19 0759 05/16/19 1126  HGBA1C 5.4 6.4* 6.1*       Assessment & Plan:   Problem List Items Addressed This Visit    Pre-diabetes - Primary    Improved A1c to 6.1, seems range is around 6 currently based on last few readings. Limited lifestyle currently Concern  with HTN, HLD, CAD  Plan:  1. Continue Metformin XR 500mg  daily - 24 hr - new manufacturer  2. Encourage improved lifestyle - keto / low carb diet, keep improving exercise 3. Follow-up 3 months w/ annual phys      Relevant Orders   POCT HgB A1C (Completed)      No orders of the defined types were placed in this encounter.    Follow up plan: Return in about 3 months (around 08/16/2019) for Yearly Medicare Checkup.  Future labs ordered for 07/2019 MEDICARE check up labs  [name], [facility] 05/16/2019, 9:37 AM

## 2019-05-16 NOTE — Assessment & Plan Note (Signed)
Improved A1c to 6.1, seems range is around 6 currently based on last few readings. Limited lifestyle currently Concern with HTN, HLD, CAD  Plan:  1. Continue Metformin XR 500mg  daily - 24 hr - new manufacturer  2. Encourage improved lifestyle - keto / low carb diet, keep improving exercise 3. Follow-up 3 months w/ annual phys

## 2019-05-16 NOTE — Patient Instructions (Addendum)
Thank you for coming to the office today.  Keep on Metformin Extended release from new manufacturer   Will contact you on mychart with result of A1c. Compare to previous.  DUE for FASTING BLOOD WORK (no food or drink after midnight before the lab appointment, only water or coffee without cream/sugar on the morning of)  SCHEDULE "Lab Only" visit in the morning at the clinic for lab draw in 3 MONTHS   - Make sure Lab Only appointment is at about 1 week before your next appointment, so that results will be available  For Lab Results, once available within 2-3 days of blood draw, you can can log in to MyChart online to view your results and a brief explanation. Also, we can discuss results at next follow-up visit.    Please schedule a Follow-up Appointment to: Return in about 3 months (around 08/16/2019) for Yearly Medicare Checkup.  If you have any other questions or concerns, please feel free to call the office or send a message through [place]. You may also schedule an earlier appointment if necessary.  Additionally, you may be receiving a survey about your experience at our office within a few days to 1 week by e-mail or mail. We value your feedback.  [name], DO [place]

## 2019-06-03 ENCOUNTER — Encounter: Payer: Self-pay | Admitting: *Deleted

## 2019-06-18 ENCOUNTER — Other Ambulatory Visit: Payer: Self-pay | Admitting: Family Medicine

## 2019-06-18 DIAGNOSIS — E782 Mixed hyperlipidemia: Secondary | ICD-10-CM

## 2019-06-25 IMAGING — CR DG CHEST 2V
3 series · 3 of 3 positions shown · non-contrast
Comparison: Chest CT on 01/07/2018

CLINICAL DATA: Hx of lymphoma of the gastric fundus last year with
chemo and rad tx. Hx of most recent back surg with pre op chest
x-ray. X-ray showed lung nodule. Hx of open heart surg. NKI
Non-smoker

EXAM:
CHEST - 2 VIEW

[chest pa]
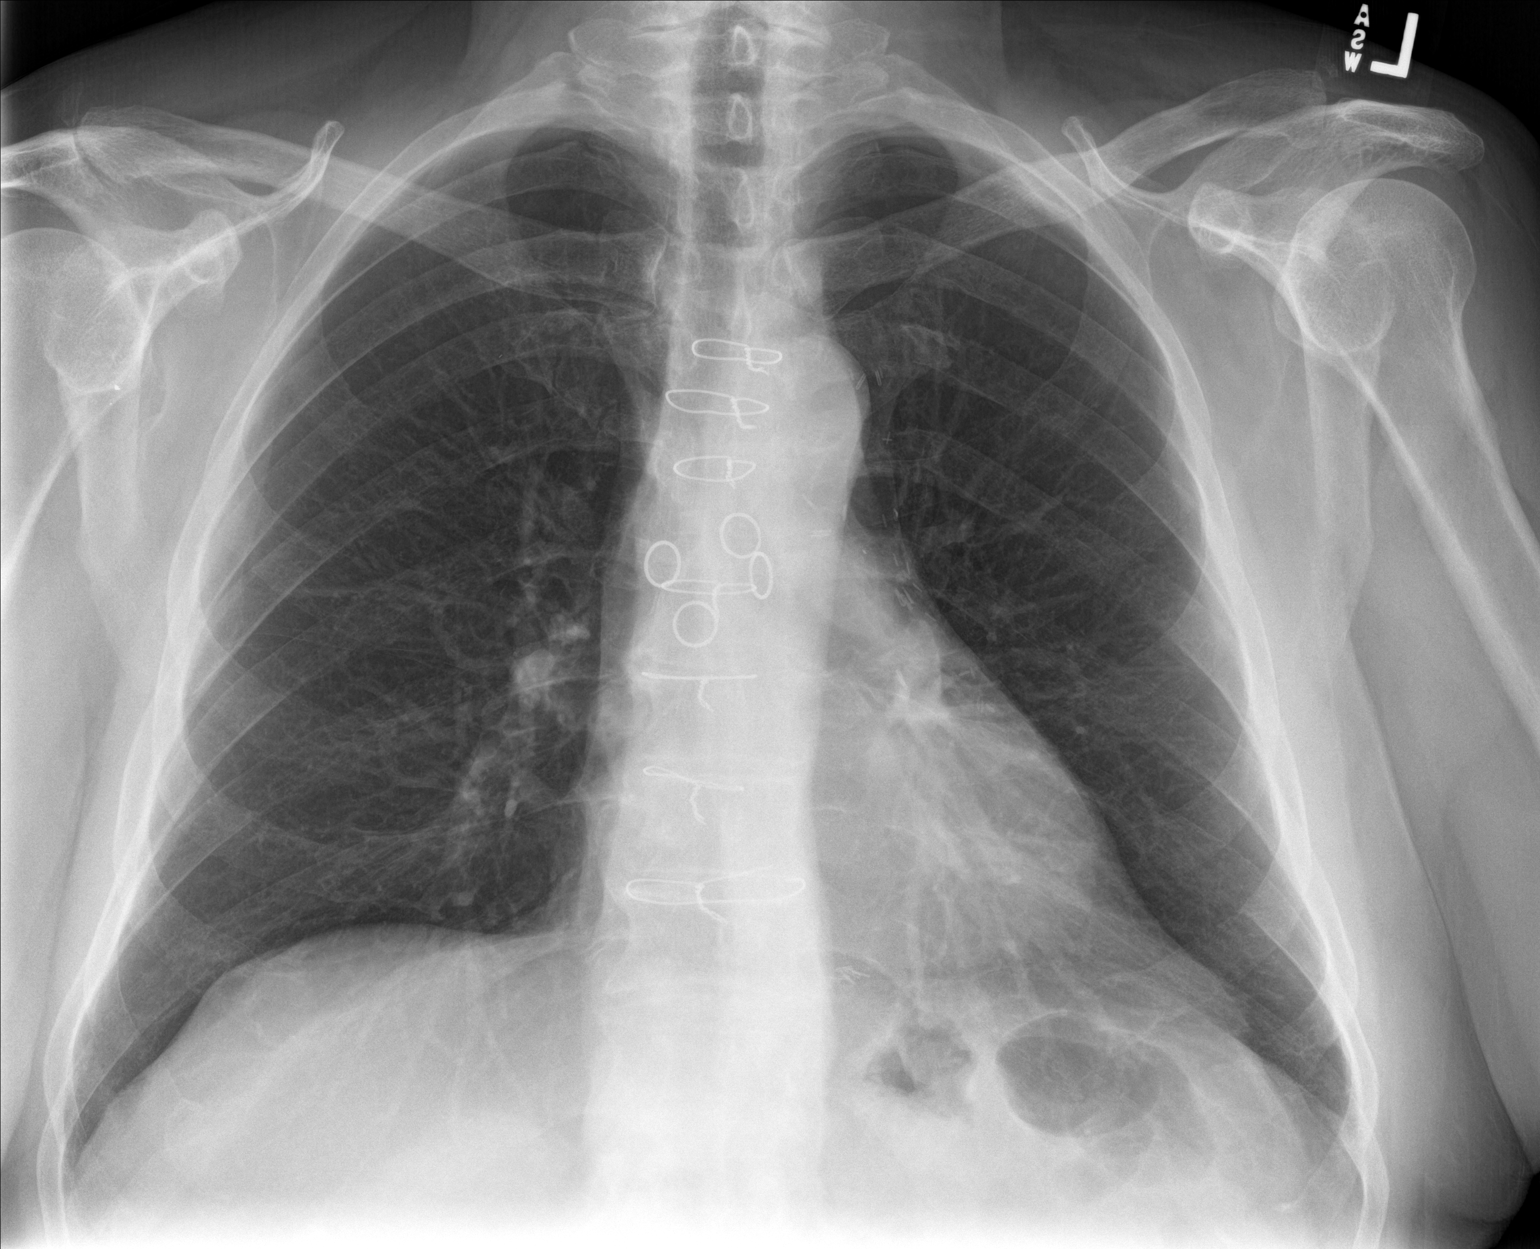

[chest lat (1 of 2)]
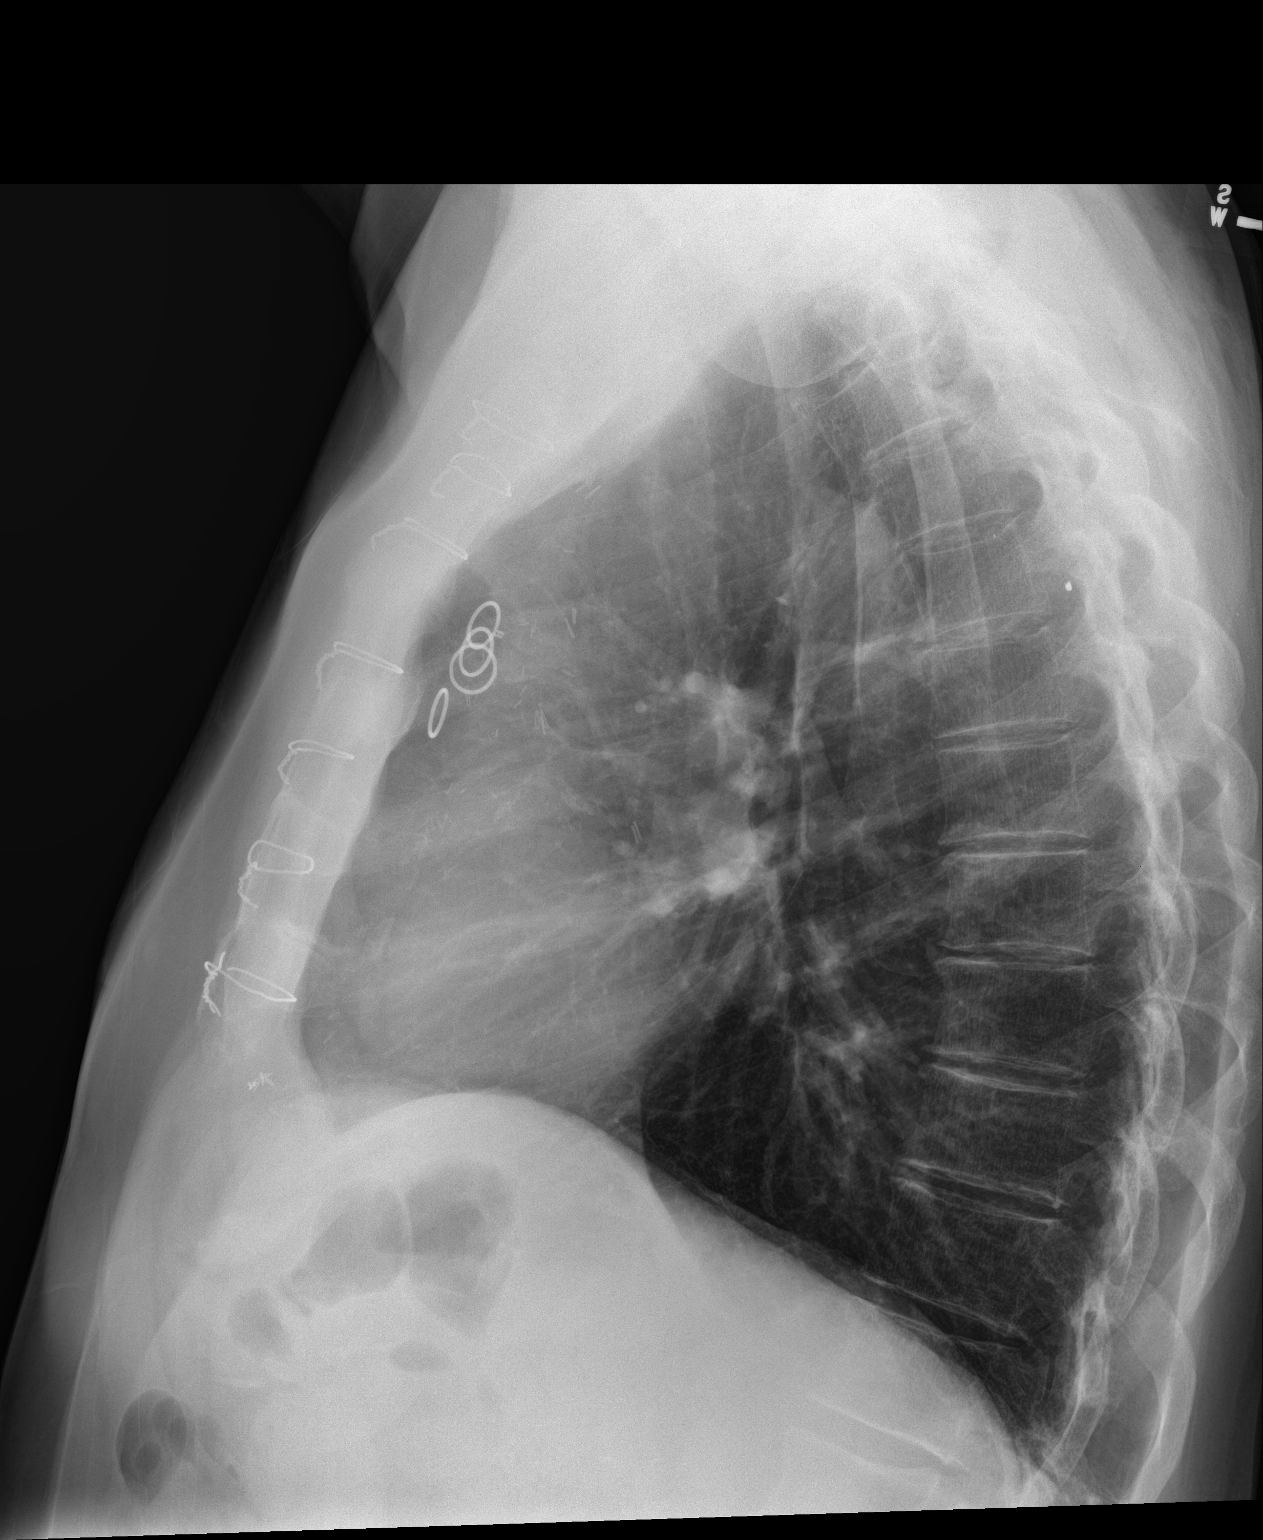

[chest lat (2 of 2)]
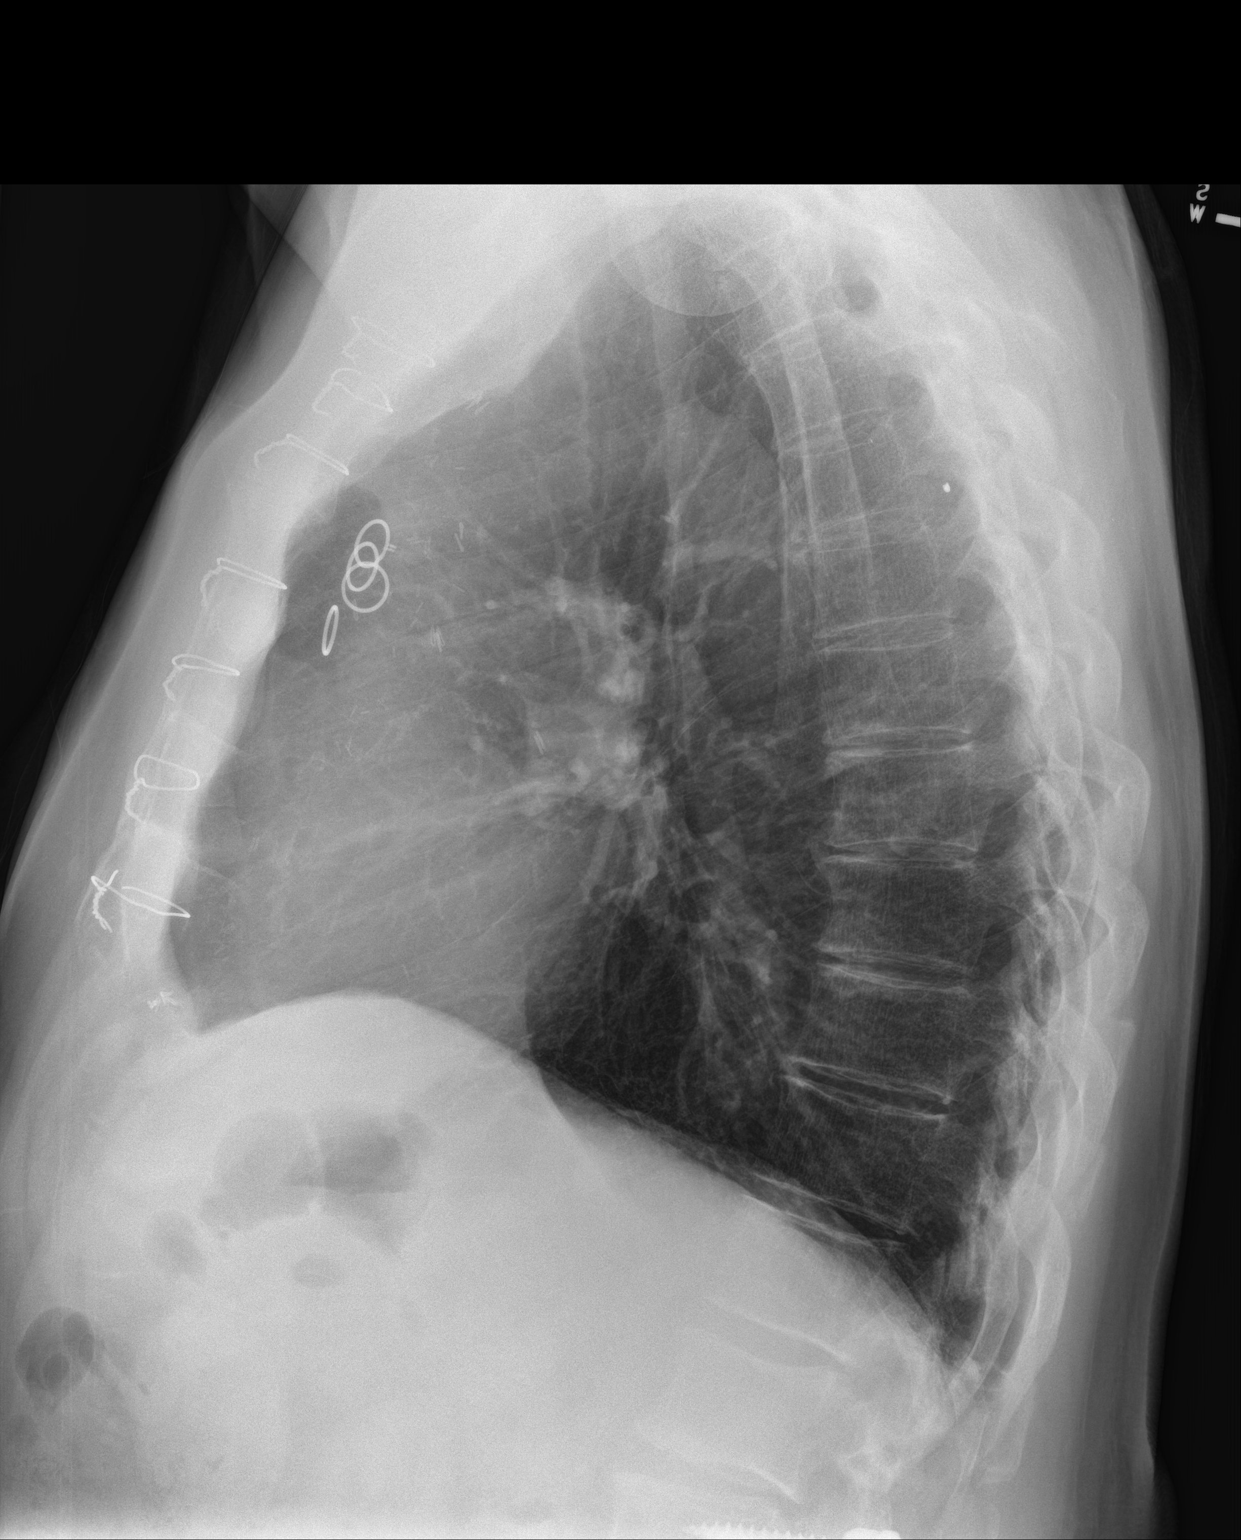

[3 of 3 positions shown; findings below may reference images not displayed]

FINDINGS: Median sternotomy and CABG. The heart size is normal. The lungs are
free of focal consolidations and pleural effusions. Surgical clips
are identified in the gastroesophageal junction region.
IMPRESSION: No evidence for acute cardiopulmonary abnormality.

## 2019-07-07 ENCOUNTER — Ambulatory Visit: Payer: Medicare Other | Admitting: Radiation Oncology

## 2019-07-11 DIAGNOSIS — I2510 Atherosclerotic heart disease of native coronary artery without angina pectoris: Secondary | ICD-10-CM | POA: Diagnosis not present

## 2019-07-27 ENCOUNTER — Other Ambulatory Visit: Payer: Self-pay

## 2019-07-28 ENCOUNTER — Ambulatory Visit
Admission: RE | Admit: 2019-07-28 | Discharge: 2019-07-28 | Disposition: A | Discharge status: Home / Self Care | Payer: Medicare Other | Source: Ambulatory Visit | Attending: Radiation Oncology | Admitting: Radiation Oncology

## 2019-07-28 ENCOUNTER — Other Ambulatory Visit: Payer: Self-pay

## 2019-07-28 ENCOUNTER — Encounter: Payer: Self-pay | Admitting: Radiation Oncology

## 2019-07-28 VITALS — BP 136/81 | HR 74 | Temp 98.1°F | Resp 18 | Wt 217.9 lb

## 2019-07-28 DIAGNOSIS — Z923 Personal history of irradiation: Secondary | ICD-10-CM | POA: Insufficient documentation

## 2019-07-28 DIAGNOSIS — Z8572 Personal history of non-Hodgkin lymphomas: Secondary | ICD-10-CM | POA: Insufficient documentation

## 2019-07-28 DIAGNOSIS — C8599 Non-Hodgkin lymphoma, unspecified, extranodal and solid organ sites: Secondary | ICD-10-CM

## 2019-07-28 NOTE — Progress Notes (Signed)
Radiation Oncology Follow up Note  Name: Corey Daniel   Date:   07/28/2019 MRN:  [id number] DOB: 1945/12/28    This 73 y.o. male presents to the clinic today for 5-month follow-up status post involved field radiation therapy first stage Ia large cell B lymphoma of the stomach.  REFERRING PROVIDER: [name] *  HPI: Patient is a 73 year old male now at 18 months having completed involved field radiation therapy to his stomach for stage I a large B-cell lymphoma status post R-CHOP x3.  Patient is seen today in routine follow-up and is doing well.  He specifically denies dyspepsia any new areas of nodal growth or dysphasia.Marland Kitchen  His last PET CT scan was back in February which I reviewed showed interval complete response to therapy with no evidence for residual or recurrent active disease.  COMPLICATIONS OF TREATMENT: none  FOLLOW UP COMPLIANCE: keeps appointments   PHYSICAL EXAM:  BP 136/81   Pulse 74   Temp 98.1 F (36.7 C)   Resp 18   Wt 217 lb 14.4 oz (98.8 kg)   BMI 31.27 kg/m  Well-developed well-nourished patient in NAD. HEENT reveals PERLA, EOMI, discs not visualized.  Oral cavity is clear. No oral mucosal lesions are identified. Neck is clear without evidence of cervical or supraclavicular adenopathy. Lungs are clear to A&P. Cardiac examination is essentially unremarkable with regular rate and rhythm without murmur rub or thrill. Abdomen is benign with no organomegaly or masses noted. Motor sensory and DTR levels are equal and symmetric in the upper and lower extremities. Cranial nerves II through XII are grossly intact. Proprioception is intact. No peripheral adenopathy or edema is identified. No motor or sensory levels are noted. Crude visual fields are within normal range.  RADIOLOGY RESULTS: PET CT scans reviewed compatible with above-stated findings  PLAN: Present time patient continues to do well with no evidence of disease.  I am pleased with his overall  progress.  I have asked to see him back in 1 year for follow-up.  Continues close follow-up care with medical oncology.  He has had some slight abnormality on chest x-ray which seems to have cleared over time.  No clinical concern.  Patient knows to call with any problems.  I would like to take this opportunity to thank you for allowing me to participate in the care of your patient.Noreene Filbert, MD

## 2019-08-11 ENCOUNTER — Other Ambulatory Visit: Payer: Medicare Other

## 2019-08-11 ENCOUNTER — Other Ambulatory Visit: Payer: Self-pay

## 2019-08-11 DIAGNOSIS — R351 Nocturia: Secondary | ICD-10-CM

## 2019-08-11 DIAGNOSIS — R7303 Prediabetes: Secondary | ICD-10-CM

## 2019-08-11 DIAGNOSIS — C8599 Non-Hodgkin lymphoma, unspecified, extranodal and solid organ sites: Secondary | ICD-10-CM | POA: Diagnosis not present

## 2019-08-11 DIAGNOSIS — E782 Mixed hyperlipidemia: Secondary | ICD-10-CM

## 2019-08-11 DIAGNOSIS — D696 Thrombocytopenia, unspecified: Secondary | ICD-10-CM | POA: Diagnosis not present

## 2019-08-11 DIAGNOSIS — I10 Essential (primary) hypertension: Secondary | ICD-10-CM

## 2019-08-12 LAB — COMPLETE METABOLIC PANEL WITH GFR
AG Ratio: 1.9 (calc) (ref 1.0–2.5)
ALT: 15 U/L (ref 9–46)
AST: 18 U/L (ref 10–35)
Albumin: 4.1 g/dL (ref 3.6–5.1)
Alkaline phosphatase (APISO): 60 U/L (ref 35–144)
BUN: 17 mg/dL (ref 7–25)
CO2: 26 mmol/L (ref 20–32)
Calcium: 9.3 mg/dL (ref 8.6–10.3)
Chloride: 104 mmol/L (ref 98–110)
Creat: 1.1 mg/dL (ref 0.70–1.18)
GFR, Est African American: 77 mL/min/{1.73_m2} (ref >=60)
GFR, Est Non African American: 66 mL/min/{1.73_m2} (ref >=60)
Globulin: 2.2 g/dL (calc) (ref 1.9–3.7)
Glucose, Bld: 130 mg/dL — ABNORMAL HIGH (ref 65–99)
Potassium: 4.9 mmol/L (ref 3.5–5.3)
Sodium: 139 mmol/L (ref 135–146)
Total Bilirubin: 0.7 mg/dL (ref 0.2–1.2)
Total Protein: 6.3 g/dL (ref 6.1–8.1)

## 2019-08-12 LAB — CBC WITH DIFFERENTIAL/PLATELET
Absolute Monocytes: 714 cells/uL (ref 200–950)
Basophils Absolute: 82 cells/uL (ref 0–200)
Basophils Relative: 1.6 %
Eosinophils Absolute: 306 cells/uL (ref 15–500)
Eosinophils Relative: 6 %
HCT: 41.5 % (ref 38.5–50.0)
Hemoglobin: 14.1 g/dL (ref 13.2–17.1)
Lymphs Abs: 984 cells/uL (ref 850–3900)
MCH: 30.5 pg (ref 27.0–33.0)
MCHC: 34 g/dL (ref 32.0–36.0)
MCV: 89.8 fL (ref 80.0–100.0)
MPV: 11.2 fL (ref 7.5–12.5)
Monocytes Relative: 14 %
Neutro Abs: 3014 cells/uL (ref 1500–7800)
Neutrophils Relative %: 59.1 %
Platelets: 151 10*3/uL (ref 140–400)
RBC: 4.62 10*6/uL (ref 4.20–5.80)
RDW: 13.9 % (ref 11.0–15.0)
Total Lymphocyte: 19.3 %
WBC: 5.1 10*3/uL (ref 3.8–10.8)

## 2019-08-12 LAB — LIPID PANEL
Cholesterol: 159 mg/dL (ref <200)
HDL: 52 mg/dL (ref >=40)
LDL Cholesterol (Calc): 84 mg/dL (calc)
Non-HDL Cholesterol (Calc): 107 mg/dL (calc) (ref <130)
Total CHOL/HDL Ratio: 3.1 (calc) (ref <5.0)
Triglycerides: 133 mg/dL (ref <150)

## 2019-08-12 LAB — HEMOGLOBIN A1C
Hgb A1c MFr Bld: 6.7 % of total Hgb — ABNORMAL HIGH (ref <5.7)
Mean Plasma Glucose: 146 (calc)
eAG (mmol/L): 8.1 (calc)

## 2019-08-12 LAB — PSA: PSA: 2.4 ng/mL (ref <=4.0)

## 2019-08-16 ENCOUNTER — Ambulatory Visit (INDEPENDENT_AMBULATORY_CARE_PROVIDER_SITE_OTHER): Payer: Medicare Other | Admitting: Family Medicine

## 2019-08-16 ENCOUNTER — Other Ambulatory Visit: Payer: Self-pay

## 2019-08-16 VITALS — BP 122/80 | HR 71 | Temp 98.7°F | Resp 16 | Ht 70.0 in | Wt 216.4 lb

## 2019-08-16 DIAGNOSIS — R7303 Prediabetes: Secondary | ICD-10-CM | POA: Diagnosis not present

## 2019-08-16 DIAGNOSIS — I10 Essential (primary) hypertension: Secondary | ICD-10-CM | POA: Diagnosis not present

## 2019-08-16 DIAGNOSIS — E782 Mixed hyperlipidemia: Secondary | ICD-10-CM | POA: Diagnosis not present

## 2019-08-16 DIAGNOSIS — Z23 Encounter for immunization: Secondary | ICD-10-CM | POA: Diagnosis not present

## 2019-08-16 NOTE — Assessment & Plan Note (Signed)
Controlled HTN on low dose meds  Complicated by CAD    Plan:  1. Continue current BP regimen Coreg 3.125mg BID, Lisinopril 2.5mg 2. Encourage improved lifestyle - low sodium diet, regular exercise 3. Continue monitor BP outside office, bring readings to next visit, if persistently >140/90 or new symptoms notify office sooner 

## 2019-08-16 NOTE — Patient Instructions (Addendum)
Thank you for coming to the office today.  Recent Labs    01/25/19 0759 05/16/19 1126 08/11/19 0805  HGBA1C 6.4* 6.1* 6.7*   Try to improve diet and exercise as discussed.  Increase Metformin from 1 up to 2 at night.  If sugar is >6.5 next time considered new diagnosis of diabetes.  Flu shot today.  Please schedule a Follow-up Appointment to: Return in about 4 months (around 12/16/2019) for 4 months PreDM A1c.  If you have any other questions or concerns, please feel free to call the office or send a message through [place]. You may also schedule an earlier appointment if necessary.  Additionally, you may be receiving a survey about your experience at our office within a few days to 1 week by e-mail or mail. We value your feedback.  [name], DO [place]

## 2019-08-16 NOTE — Assessment & Plan Note (Signed)
Controlled cholesterol on intermittent statin / zetia and lifestyle Last lipid panel 07/2019 With CAD  Plan: 1. Continue current meds - Rosuvastatin 5mg  - 3 times week - tolerating well, with Zetia 2. Continue ASA 81mg  for secondary ASCVD risk reduction 3. Encourage improved lifestyle - low carb/cholesterol, reduce portion size, continue improving regular exercise

## 2019-08-16 NOTE — Progress Notes (Signed)
Subjective:    Patient ID: Corey Daniel, male    DOB: 1946-08-17, 73 y.o.   MRN: [id number]  Corey Daniel is a 73 y.o. male presenting on 08/16/2019 for Follow-up, Hypertension, and Diabetes   HPI   Pre-Diabetes: Previous trend with A1c ranging 5.4 to 6.4 in recent past. Now recent result A1c 6.7 concerning CBGs: Not checking CBG Meds:Metformin XR 500mg daily in PM - he says he forgets to take if it was in AM Currently on ACEi Lifestyle: Poor lifestyle - Weightup 10 lbs in 3 months - Diet (not always adhering to healthy low carb diet) - Exercise (lately no regular walking exercise - plans to resume this walking at mall) Denies hypoglycemia, polyuria, visual changes, numbness or tingling.  CHRONIC HTN: Reportsno concerns, not checking BP at home Current Meds -Lisinopril 2.5mg  daily, Carvedilol 3.125mg  BID Reports good compliance, took meds today. Tolerating well, w/o complaints. Denies CP, dyspnea, HA, edema, dizziness / lightheadedness  HYPERLIPIDEMIA: - Reports no concerns. Last lipid 07/2019 Improved overall - LDL controlled - Currently takingRosuvastatin intermittent Mon-Weds-Fri and Zetia, tolerating well without side effects or myalgias  PMH - Lymphoma Gastric (Diffuse Large B-Cell Lymphoma) / Thrombocytopenia   Health Maintenance:  Due for Flu Shot, will receive today  Prostate Cancer Screening: PSA trend in past 1-3, now result is 2.4 (2020). Asymptomatic.   Depression screen [facility] 2/9 05/16/2019 02/01/2019 11/24/2018  Decreased Interest 0 0 0  Down, Depressed, Hopeless 0 0 0  PHQ - 2 Score 0 0 0    Social History   Tobacco Use  . Smoking status: Former Smoker    Packs/day: 0.50    Years: 30.00    Pack years: 15.00    Types: Cigarettes    Quit date: 1983    Years since quitting: 37.7  . Smokeless tobacco: Former Network engineer Use Topics  . Alcohol use: No  . Drug use: No    Review of Systems Per HPI unless specifically indicated above    Objective:    BP 122/80 (BP Location: Right Arm, Patient Position: Sitting, Cuff Size: Normal)   Pulse 71   Temp 98.7 F (37.1 C) (Oral)   Resp 16   Ht 5\' 10"  (1.778 m)   Wt 216 lb 6.4 oz (98.2 kg)   BMI 31.05 kg/m   Wt Readings from Last 3 Encounters:  08/16/19 216 lb 6.4 oz (98.2 kg)  07/28/19 217 lb 14.4 oz (98.8 kg)  05/16/19 206 lb (93.4 kg)    Physical Exam Vitals signs and nursing note reviewed.  Constitutional:      General: He is not in acute distress.    Appearance: He is well-developed. He is not diaphoretic.     Comments: Well-appearing, comfortable, cooperative  HENT:     Head: Normocephalic and atraumatic.  Eyes:     General:        Right eye: No discharge.        Left eye: No discharge.     Conjunctiva/sclera: Conjunctivae normal.  Cardiovascular:     Rate and Rhythm: Normal rate.  Pulmonary:     Effort: Pulmonary effort is normal.  Skin:    General: Skin is warm and dry.     Findings: No erythema or rash.  Neurological:     Mental Status: He is alert and oriented to person, place, and time.  Psychiatric:        Behavior: Behavior normal.     Comments: Well groomed, good eye contact, normal speech  and thoughts        Results for orders placed or performed in visit on 08/11/19  PSA  Result Value Ref Range   PSA 2.4 < OR = 4.0 ng/mL  Lipid panel  Result Value Ref Range   Cholesterol 159 <200 mg/dL   HDL 52 > OR = 40 mg/dL   Triglycerides 133 <150 mg/dL   LDL Cholesterol (Calc) 84 mg/dL (calc)   Total CHOL/HDL Ratio 3.1 <5.0 (calc)   Non-HDL Cholesterol (Calc) 107 <130 mg/dL (calc)  COMPLETE METABOLIC PANEL WITH GFR  Result Value Ref Range   Glucose, Bld 130 (H) 65 - 99 mg/dL   BUN 17 7 - 25 mg/dL   Creat 1.10 0.70 - 1.18 mg/dL   GFR, Est Non African American 66 > OR = 60 mL/min/1.11m2   GFR, Est African American 77 > OR = 60 mL/min/1.20m2   BUN/Creatinine Ratio NOT APPLICABLE 6 - 22 (calc)   Sodium 139 135 - 146 mmol/L   Potassium 4.9  3.5 - 5.3 mmol/L   Chloride 104 98 - 110 mmol/L   CO2 26 20 - 32 mmol/L   Calcium 9.3 8.6 - 10.3 mg/dL   Total Protein 6.3 6.1 - 8.1 g/dL   Albumin 4.1 3.6 - 5.1 g/dL   Globulin 2.2 1.9 - 3.7 g/dL (calc)   AG Ratio 1.9 1.0 - 2.5 (calc)   Total Bilirubin 0.7 0.2 - 1.2 mg/dL   Alkaline phosphatase (APISO) 60 35 - 144 U/L   AST 18 10 - 35 U/L   ALT 15 9 - 46 U/L  CBC with Differential/Platelet  Result Value Ref Range   WBC 5.1 3.8 - 10.8 Thousand/uL   RBC 4.62 4.20 - 5.80 Million/uL   Hemoglobin 14.1 13.2 - 17.1 g/dL   HCT 41.5 38.5 - 50.0 %   MCV 89.8 80.0 - 100.0 fL   MCH 30.5 27.0 - 33.0 pg   MCHC 34.0 32.0 - 36.0 g/dL   RDW 13.9 11.0 - 15.0 %   Platelets 151 140 - 400 Thousand/uL   MPV 11.2 7.5 - 12.5 fL   Neutro Abs 3,014 1,500 - 7,800 cells/uL   Lymphs Abs 984 850 - 3,900 cells/uL   Absolute Monocytes 714 200 - 950 cells/uL   Eosinophils Absolute 306 15 - 500 cells/uL   Basophils Absolute 82 0 - 200 cells/uL   Neutrophils Relative % 59.1 %   Total Lymphocyte 19.3 %   Monocytes Relative 14.0 %   Eosinophils Relative 6.0 %   Basophils Relative 1.6 %  Hemoglobin A1c  Result Value Ref Range   Hgb A1c MFr Bld 6.7 (H) <5.7 % of total Hgb   Mean Plasma Glucose 146 (calc)   eAG (mmol/L) 8.1 (calc)      Assessment & Plan:   Problem List Items Addressed This Visit    Essential hypertension    Controlled HTN on low dose meds  Complicated by CAD    Plan:  1. Continue current BP regimen Coreg 3.125mg  BID, Lisinopril 2.5mg  2. Encourage improved lifestyle - low sodium diet, regular exercise 3. Continue monitor BP outside office, bring readings to next visit, if persistently >140/90 or new symptoms notify office sooner      Mixed hyperlipidemia    Controlled cholesterol on intermittent statin / zetia and lifestyle Last lipid panel 07/2019 With CAD  Plan: 1. Continue current meds - Rosuvastatin 5mg  - 3 times week - tolerating well, with Zetia 2. Continue ASA 81mg  for  secondary  ASCVD risk reduction 3. Encourage improved lifestyle - low carb/cholesterol, reduce portion size, continue improving regular exercise      Pre-diabetes - Primary    Significant worsening elevated A1c now up to 6.7, due to poor lifestyle limited exercise lately Limited lifestyle currently Concern with HTN, HLD, CAD  Plan:  1. Increase metformin XR 500 up to 1000mg  per dose PM only due to forgetting AM dosage 2. Encourage improved lifestyle - keto / low carb diet, keep improving exercise 3. Follow-up 3-4 months PreDM A1c - caution if still A1c >6.5 then new dx DM       Other Visit Diagnoses    Needs flu shot       Relevant Orders   Flu Vaccine QUAD High Dose(Fluad) (Completed)      No orders of the defined types were placed in this encounter.   Follow up plan: Return in about 4 months (around 12/16/2019) for 4 months PreDM A1c.   [name], [facility] 08/16/2019, 8:39 AM

## 2019-08-16 NOTE — Assessment & Plan Note (Signed)
Significant worsening elevated A1c now up to 6.7, due to poor lifestyle limited exercise lately Limited lifestyle currently Concern with HTN, HLD, CAD  Plan:  1. Increase metformin XR 500 up to 1000mg  per dose PM only due to forgetting AM dosage 2. Encourage improved lifestyle - keto / low carb diet, keep improving exercise 3. Follow-up 3-4 months PreDM A1c - caution if still A1c >6.5 then new dx DM

## 2019-08-18 DIAGNOSIS — M5136 Other intervertebral disc degeneration, lumbar region: Secondary | ICD-10-CM | POA: Diagnosis not present

## 2019-08-18 DIAGNOSIS — Z981 Arthrodesis status: Secondary | ICD-10-CM | POA: Diagnosis not present

## 2019-08-18 DIAGNOSIS — M47816 Spondylosis without myelopathy or radiculopathy, lumbar region: Secondary | ICD-10-CM | POA: Diagnosis not present

## 2019-08-18 DIAGNOSIS — M5134 Other intervertebral disc degeneration, thoracic region: Secondary | ICD-10-CM | POA: Diagnosis not present

## 2019-08-18 DIAGNOSIS — M4316 Spondylolisthesis, lumbar region: Secondary | ICD-10-CM | POA: Diagnosis not present

## 2019-10-10 ENCOUNTER — Telehealth: Payer: Self-pay | Admitting: Family Medicine

## 2019-10-10 NOTE — Chronic Care Management (AMB) (Signed)
°  Chronic Care Management   Outreach Note  10/10/2019 Name: Corey Daniel MRN: [id number] DOB: 02-17-1946  Referred by: Olin Hauser, DO Reason for referral : Chronic Care Management (Initial CCM outreach was unsuccessful. )   An unsuccessful telephone outreach was attempted today. The patient was referred to the case management team by for assistance with care management and care coordination.   Follow Up Plan: A HIPPA compliant phone message was left for the patient providing contact information and requesting a return call.  The care management team will reach out to the patient again over the next 7 days.  If patient returns call to provider office, please advise to call [place] [facility] [place] [place] [facility]  [place], [place] [address] Direct Dial: [email or web address]  Website: [email or web address]

## 2019-10-19 ENCOUNTER — Other Ambulatory Visit: Payer: Self-pay

## 2019-10-19 NOTE — Progress Notes (Signed)
Patient pre screened for office appointment, no questions or concerns today. Patient reminded of upcoming appointment time and date. 

## 2019-10-20 ENCOUNTER — Inpatient Hospital Stay (HOSPITAL_BASED_OUTPATIENT_CLINIC_OR_DEPARTMENT_OTHER): Payer: Medicare Other | Admitting: Internal Medicine

## 2019-10-20 ENCOUNTER — Inpatient Hospital Stay: Payer: Medicare Other | Attending: Internal Medicine

## 2019-10-20 ENCOUNTER — Other Ambulatory Visit: Payer: Self-pay

## 2019-10-20 DIAGNOSIS — Z8572 Personal history of non-Hodgkin lymphomas: Secondary | ICD-10-CM | POA: Diagnosis not present

## 2019-10-20 DIAGNOSIS — I252 Old myocardial infarction: Secondary | ICD-10-CM | POA: Insufficient documentation

## 2019-10-20 DIAGNOSIS — Z87891 Personal history of nicotine dependence: Secondary | ICD-10-CM | POA: Diagnosis not present

## 2019-10-20 DIAGNOSIS — M549 Dorsalgia, unspecified: Secondary | ICD-10-CM | POA: Diagnosis not present

## 2019-10-20 DIAGNOSIS — G8929 Other chronic pain: Secondary | ICD-10-CM | POA: Diagnosis not present

## 2019-10-20 DIAGNOSIS — R911 Solitary pulmonary nodule: Secondary | ICD-10-CM | POA: Diagnosis not present

## 2019-10-20 DIAGNOSIS — I2510 Atherosclerotic heart disease of native coronary artery without angina pectoris: Secondary | ICD-10-CM

## 2019-10-20 DIAGNOSIS — C8599 Non-Hodgkin lymphoma, unspecified, extranodal and solid organ sites: Secondary | ICD-10-CM

## 2019-10-20 DIAGNOSIS — Z7982 Long term (current) use of aspirin: Secondary | ICD-10-CM | POA: Insufficient documentation

## 2019-10-20 DIAGNOSIS — Z7984 Long term (current) use of oral hypoglycemic drugs: Secondary | ICD-10-CM | POA: Diagnosis not present

## 2019-10-20 DIAGNOSIS — Z79899 Other long term (current) drug therapy: Secondary | ICD-10-CM | POA: Insufficient documentation

## 2019-10-20 DIAGNOSIS — I10 Essential (primary) hypertension: Secondary | ICD-10-CM | POA: Insufficient documentation

## 2019-10-20 DIAGNOSIS — M129 Arthropathy, unspecified: Secondary | ICD-10-CM | POA: Insufficient documentation

## 2019-10-20 DIAGNOSIS — E785 Hyperlipidemia, unspecified: Secondary | ICD-10-CM | POA: Diagnosis not present

## 2019-10-20 LAB — CBC WITH DIFFERENTIAL/PLATELET
Abs Immature Granulocytes: 0.02 10*3/uL (ref 0.00–0.07)
Basophils Absolute: 0.1 10*3/uL (ref 0.0–0.1)
Basophils Relative: 1 %
Eosinophils Absolute: 0.2 10*3/uL (ref 0.0–0.5)
Eosinophils Relative: 4 %
HCT: 41.8 % (ref 39.0–52.0)
Hemoglobin: 14.2 g/dL (ref 13.0–17.0)
Immature Granulocytes: 0 %
Lymphocytes Relative: 23 %
Lymphs Abs: 1.3 10*3/uL (ref 0.7–4.0)
MCH: 31.1 pg (ref 26.0–34.0)
MCHC: 34 g/dL (ref 30.0–36.0)
MCV: 91.7 fL (ref 80.0–100.0)
Monocytes Absolute: 0.6 10*3/uL (ref 0.1–1.0)
Monocytes Relative: 11 %
Neutro Abs: 3.3 10*3/uL (ref 1.7–7.7)
Neutrophils Relative %: 61 %
Platelets: 162 10*3/uL (ref 150–400)
RBC: 4.56 MIL/uL (ref 4.22–5.81)
RDW: 13 % (ref 11.5–15.5)
WBC: 5.5 10*3/uL (ref 4.0–10.5)
nRBC: 0 % (ref 0.0–0.2)

## 2019-10-20 LAB — COMPREHENSIVE METABOLIC PANEL
ALT: 25 U/L (ref 0–44)
AST: 26 U/L (ref 15–41)
Albumin: 4.4 g/dL (ref 3.5–5.0)
Alkaline Phosphatase: 59 U/L (ref 38–126)
Anion gap: 7 (ref 5–15)
BUN: 20 mg/dL (ref 8–23)
CO2: 27 mmol/L (ref 22–32)
Calcium: 9.3 mg/dL (ref 8.9–10.3)
Chloride: 102 mmol/L (ref 98–111)
Creatinine, Ser: 1.01 mg/dL (ref 0.61–1.24)
GFR calc Af Amer: >60 mL/min (ref >60)
GFR calc non Af Amer: >60 mL/min (ref >60)
Glucose, Bld: 108 mg/dL — ABNORMAL HIGH (ref 70–99)
Potassium: 4.2 mmol/L (ref 3.5–5.1)
Sodium: 136 mmol/L (ref 135–145)
Total Bilirubin: 0.7 mg/dL (ref 0.3–1.2)
Total Protein: 7.4 g/dL (ref 6.5–8.1)

## 2019-10-20 LAB — LACTATE DEHYDROGENASE: LDH: 142 U/L (ref 98–192)

## 2019-10-20 NOTE — Progress Notes (Signed)
[facility] OFFICE PROGRESS NOTE  Patient Care Team: Olin Hauser, DO as PCP - General (Family Medicine)  Cancer Staging No matching staging information was found for the patient.   Oncology History Overview Note  # AUG 2018- DIFFUSE LARGE B CELL LYMPHOMA STOMACH-STAGE IE [BMBx-NEG]Fundus ;CD-20 pos;variable- bcl6/mum-?? GCB vs. ABC subtype;  [EGD for IDA; Dr.Hung; [place]]- NEG for FISH for;myc; bcl-6; bcl-2;11:14; MALT [8q21];NEG for CD-10; ki-67-70%; SEP PET- uptake in stomach. Hepatitis panel-NEG; BMBx-NEG  # SEP 25th 2018- R-CHOP x3 ; RT [until jan 3rd 2019]; PET Feb 2019- CR; s/p EGD- NEG [March 2019]  # AUG 2018- H.pylori positive/stomach body.   # IDA-resolved  # CAD [s/p CABG; Dr.fath];MUGA scan [sep 2018]- 56.5%; port explanted.  ---------------------------------------------------------------   DIAGNOSIS: [ aug 2018]- DLBCL OD STOMACH  STAGE: IE ; GOALS: curative  CURRENT/MOST RECENT THERAPY;  surveillance    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[place])      INTERVAL HISTORY:  Corey Daniel 73 y.o.  male pleasant patient above history of diffuse large B-cell lymphoma of the stomach is here for follow-up.  Patient continues to have back discomfort post his surgery at [facility].  Otherwise denies any new shortness of breath or cough.  No nausea no vomiting.  Review of Systems  Constitutional: Negative for chills, diaphoresis, fever, malaise/fatigue and weight loss.  HENT: Negative for nosebleeds and sore throat.   Eyes: Negative for double vision.  Respiratory: Negative for cough, hemoptysis, sputum production, shortness of breath and wheezing.   Cardiovascular: Negative for chest pain, palpitations, orthopnea and leg swelling.  Gastrointestinal: Negative for abdominal pain, blood in stool, constipation, diarrhea, heartburn, melena, nausea and vomiting.  Genitourinary: Negative for dysuria, frequency and urgency.  Musculoskeletal: Positive for back pain. Negative  for joint pain.  Skin: Negative.  Negative for itching and rash.  Neurological: Negative for dizziness, tingling, focal weakness, weakness and headaches.  Endo/Heme/Allergies: Does not bruise/bleed easily.  Psychiatric/Behavioral: Negative for depression. The patient is not nervous/anxious and does not have insomnia.       PAST MEDICAL HISTORY :  Past Medical History:  Diagnosis Date  . Arthritis   . ASCVD (arteriosclerotic cardiovascular disease)   . Back pain    with radiation  . Cancer ([place])    lymphoma  . Coronary artery disease   . Hyperlipidemia   . Hypertension   . IDA (iron deficiency anemia)   . Lower leg pain   . Myocardial infarction ([place])   . Prediabetes     PAST SURGICAL HISTORY :   Past Surgical History:  Procedure Laterality Date  . AUTOGRAFT STRUCTURAL ICBG OBTAINED SEPARATE INCISION FOR SPINE SURGERY   Bilateral 07/15/2013  . BACK SURGERY    . CATARACT EXTRACTION  2010  . COLONOSCOPY  06/2017  . CORONARY ARTERY BYPASS GRAFT    . IR IMAGING GUIDED PORT INSERTION  07/31/2017  . IR REMOVAL TUN ACCESS W/ PORT W/O FL MOD SED  02/12/2018  . LAMINECTOMY LUMBAR EXCISON/EVACUATION EXTRADURAL INTRASPINAL LESION Bilateral 07/15/2013     . LAMINOTOMY REEXPLORATION POSTERIOR LUMBAR W/NERVE DECOMP & DISCECTOMY Bilateral 07/15/2013     . UPPER GI ENDOSCOPY  06/2017    FAMILY HISTORY :   Family History  Problem Relation Age of Onset  . Heart disease Mother   . Coarctation of the aorta Brother     SOCIAL HISTORY:   Social History   Tobacco Use  . Smoking status: Former Smoker    Packs/day: 0.50    Years: 30.00  Pack years: 15.00    Types: Cigarettes    Quit date: 61    Years since quitting: 37.9  . Smokeless tobacco: Former Network engineer Use Topics  . Alcohol use: No  . Drug use: No    ALLERGIES:  is allergic to tramadol.  MEDICATIONS:  Current Outpatient Medications  Medication Sig Dispense Refill  . aspirin 81 MG chewable tablet Chew 81 mg by  mouth daily.     . carvedilol (COREG) 3.125 MG tablet TAKE 1 TABLET TWICE A DAY WITH MEALS 180 tablet 3  . Cholecalciferol (VITAMIN D3) 2000 UNITS capsule Take 2,000 Units by mouth daily.     . Coenzyme Q10 100 MG capsule Take by mouth.    . ezetimibe (ZETIA) 10 MG tablet TAKE 1 TABLET AT BEDTIME 90 tablet 3  . Fish Oil-Cholecalciferol (FISH OIL + D3) 1000-1000 MG-UNIT CAPS Take by mouth.    . fluticasone (FLONASE) 50 MCG/ACT nasal spray Place 1 spray into both nostrils daily.     Marland Kitchen lisinopril (ZESTRIL) 2.5 MG tablet TAKE 1 TABLET DAILY 90 tablet 3  . metFORMIN (GLUCOPHAGE-XR) 500 MG 24 hr tablet Take 1 tablet (500 mg total) by mouth daily with breakfast. 90 tablet 3  . omeprazole (PRILOSEC) 40 MG capsule Take 40 mg by mouth daily.     . rosuvastatin (CRESTOR) 5 MG tablet Take 1 tablet (5 mg total) by mouth 3 (three) times a week. 36 tablet 3   No current facility-administered medications for this visit.     PHYSICAL EXAMINATION: ECOG PERFORMANCE STATUS: 0 - Asymptomatic  BP 128/78 (BP Location: Left Arm, Patient Position: Sitting)   Pulse 66   Temp (!) 96.4 F (35.8 C) (Tympanic)   Resp 16   Wt 217 lb 3.2 oz (98.5 kg)   SpO2 97%   BMI 31.16 kg/m   Filed Weights   10/20/19 1125  Weight: 217 lb 3.2 oz (98.5 kg)   Physical Exam  Constitutional: He is oriented to person, place, and time and well-developed, well-nourished, and in no distress.  HENT:  Head: Normocephalic and atraumatic.  Mouth/Throat: Oropharynx is clear and moist. No oropharyngeal exudate.  Eyes: Pupils are equal, round, and reactive to light.  Neck: Normal range of motion. Neck supple.  Cardiovascular: Normal rate and regular rhythm.  Pulmonary/Chest: No respiratory distress. He has no wheezes.  Abdominal: Soft. Bowel sounds are normal. He exhibits no distension and no mass. There is no abdominal tenderness. There is no rebound and no guarding.  Musculoskeletal: Normal range of motion.        General: No  tenderness or edema.  Neurological: He is alert and oriented to person, place, and time.  Skin: Skin is warm.  Psychiatric: Affect normal.       LABORATORY DATA:  I have reviewed the data as listed    Component Value Date/Time   NA 136 10/20/2019 1047   NA 139 02/06/2016 0806   K 4.2 10/20/2019 1047   CL 102 10/20/2019 1047   CO2 27 10/20/2019 1047   GLUCOSE 108 (H) 10/20/2019 1047   BUN 20 10/20/2019 1047   BUN 18 02/06/2016 0806   CREATININE 1.01 10/20/2019 1047   CREATININE 1.10 08/11/2019 0805   CALCIUM 9.3 10/20/2019 1047   PROT 7.4 10/20/2019 1047   PROT 7.1 02/06/2016 0806   ALBUMIN 4.4 10/20/2019 1047   ALBUMIN 4.7 02/06/2016 0806   AST 26 10/20/2019 1047   ALT 25 10/20/2019 1047   ALKPHOS 59 10/20/2019  1047   BILITOT 0.7 10/20/2019 1047   BILITOT 0.5 02/06/2016 0806   GFRNONAA >60 10/20/2019 1047   GFRNONAA 66 08/11/2019 0805   GFRAA >60 10/20/2019 1047   GFRAA 77 08/11/2019 0805    No results found for: SPEP, UPEP  Lab Results  Component Value Date   WBC 5.5 10/20/2019   NEUTROABS 3.3 10/20/2019   HGB 14.2 10/20/2019   HCT 41.8 10/20/2019   MCV 91.7 10/20/2019   PLT 162 10/20/2019      Chemistry      Component Value Date/Time   NA 136 10/20/2019 1047   NA 139 02/06/2016 0806   K 4.2 10/20/2019 1047   CL 102 10/20/2019 1047   CO2 27 10/20/2019 1047   BUN 20 10/20/2019 1047   BUN 18 02/06/2016 0806   CREATININE 1.01 10/20/2019 1047   CREATININE 1.10 08/11/2019 0805      Component Value Date/Time   CALCIUM 9.3 10/20/2019 1047   ALKPHOS 59 10/20/2019 1047   AST 26 10/20/2019 1047   ALT 25 10/20/2019 1047   BILITOT 0.7 10/20/2019 1047   BILITOT 0.5 02/06/2016 0806       RADIOGRAPHIC STUDIES: I have personally reviewed the radiological images as listed and agreed with the findings in the report. No results found.   ASSESSMENT & PLAN:  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[place]) #Diffuse large B-cell lymphoma of the stomach; stage I E.  Status post  chemotherapy followed by radiation; February 2019 PET scan CR; upper endoscopy March 2019 normal as per patient.  Stable.  #Clinically, no evidence of recurrence.  Continue surveillance every 6 months on a clinical basis without any imaging  #Chronic back pain s/p back surgery; STABLE.  # Right lung 5 mm nodule [incidental]; Chest x-ray December 2019 -negative.  CXR- June 2020-WNL.   #Counseled the patient regarding-Covid precautions/restrictions given his plans for travel.  # DISPOSITION: print copy of labs # follow up in 6 months- MD-labs-cbc/cmp/ldh; Dr.B     No orders of the defined types were placed in this encounter.  All questions were answered. The patient knows to call the clinic with any problems, questions or concerns.      Cammie Sickle, MD 10/21/2019 6:46 AM

## 2019-10-20 NOTE — Assessment & Plan Note (Addendum)
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  Status post chemotherapy followed by radiation; February 2019 PET scan CR; upper endoscopy March 2019 normal as per patient.  Stable.  #Clinically, no evidence of recurrence.  Continue surveillance every 6 months on a clinical basis without any imaging  #Chronic back pain s/p back surgery; STABLE.  # Right lung 5 mm nodule [incidental]; Chest x-ray December 2019 -negative.  CXR- June 2020-WNL.   #Counseled the patient regarding-Covid precautions/restrictions given his plans for travel.  # DISPOSITION: print copy of labs # follow up in 6 months- MD-labs-cbc/cmp/ldh; Dr.B

## 2019-10-23 DIAGNOSIS — Z20828 Contact with and (suspected) exposure to other viral communicable diseases: Secondary | ICD-10-CM | POA: Diagnosis not present

## 2019-10-25 NOTE — Chronic Care Management (AMB) (Signed)
°  Chronic Care Management   Outreach Note  10/25/2019 Name: Corey Daniel MRN: [id number] DOB: 02-02-1946  Referred by: Olin Hauser, DO Reason for referral : Chronic Care Management (Initial CCM outreach was unsuccessful. ) and Chronic Care Management (Second CCM outreach was unsuccessful. )   A second unsuccessful telephone outreach was attempted today. The patient was referred to the case management team for assistance with care management and care coordination.   Follow Up Plan: A HIPPA compliant phone message was left for the patient providing contact information and requesting a return call.  The care management team will reach out to the patient again over the next 7 days.  If patient returns call to provider office, please advise to call [place] [facility] [place], [place] [facility]  [place], [place] [address] Direct Dial: [email or web address]  Website: [email or web address]

## 2019-11-02 NOTE — Chronic Care Management (AMB) (Signed)
  Chronic Care Management   Outreach Note  11/02/2019 Name: Corey Daniel MRN: [id number] DOB: 1946-09-09  Referred by: Olin Hauser, DO Reason for referral : Chronic Care Management (Initial CCM outreach was unsuccessful. ), Chronic Care Management (Second CCM outreach was unsuccessful. ), and Chronic Care Management (Third CCM outreach was unsuccessful.)   Third unsuccessful telephone outreach was attempted today. The patient was referred to the case management team for assistance with care management and care coordination. The patient's primary care provider has been notified of our unsuccessful attempts to make or maintain contact with the patient. The care management team is pleased to engage with this patient at any time in the future should he/she be interested in assistance from the care management team.   Follow Up Plan: The care management team is available to follow up with the patient after provider conversation with the patient regarding recommendation for care management engagement and subsequent re-referral to the care management team.   [place], [place] [facility]  [place], [place] [address] Direct Dial: [email or web address]  Website: [email or web address]

## 2019-11-15 DIAGNOSIS — Z20828 Contact with and (suspected) exposure to other viral communicable diseases: Secondary | ICD-10-CM | POA: Diagnosis not present

## 2019-11-29 ENCOUNTER — Ambulatory Visit (INDEPENDENT_AMBULATORY_CARE_PROVIDER_SITE_OTHER): Payer: Medicare Other

## 2019-11-29 VITALS — Temp 97.4°F | Ht 71.0 in | Wt 210.0 lb

## 2019-11-29 DIAGNOSIS — Z0000 Encounter for general adult medical examination without abnormal findings: Secondary | ICD-10-CM | POA: Diagnosis not present

## 2019-11-29 NOTE — Progress Notes (Signed)
Subjective:   Corey Daniel is a 74 y.o. male who presents for Medicare Annual/Subsequent preventive examination.  This visit is being conducted via phone call  - after an attmept to do on video chat - due to the COVID-19 pandemic. This patient has given me verbal consent via phone to conduct this visit, patient states they are participating from their home address. Some vital signs may be absent or patient reported.   Patient identification: identified by name, DOB, and current address.    Review of Systems:   Cardiac Risk Factors include: advanced age (>30men, >35 women);male gender;dyslipidemia;hypertension     Objective:    Vitals: Temp (!) 97.4 F (36.3 C)   Ht 5\' 11"  (1.803 m)   Wt 210 lb (95.3 kg)   BMI 29.29 kg/m   Body mass index is 29.29 kg/m.  Advanced Directives 11/29/2019 10/19/2019 07/28/2019 04/20/2019 11/24/2018 10/19/2018 07/08/2018  Does Patient Have a Medical Advance Directive? Yes Yes Yes No No No Yes  Type of Advance Directive Living will;Healthcare Power of [place];Living will [place];Living will - - - [place];Living will  Does patient want to make changes to medical advance directive? - No - Patient declined - No - Patient declined Yes (MAU/Ambulatory/Procedural Areas - Information given) No - Patient declined No - Patient declined  Copy of [place] in Chart? No - copy requested No - copy requested - - - - No - copy requested  Would patient like information on creating a medical advance directive? - No - Patient declined - - - - No - Patient declined    Tobacco Social History   Tobacco Use  Smoking Status Former Smoker  . Packs/day: 0.50  . Years: 30.00  . Pack years: 15.00  . Types: Cigarettes  . Quit date: 90  . Years since quitting: 38.0  Smokeless Tobacco Never Used     Counseling given: Not Answered   Clinical Intake:  Pre-visit preparation completed:  Yes  Pain : No/denies pain     Nutritional Status: BMI 25 -29 Overweight Nutritional Risks: None Diabetes: No  How often do you need to have someone help you when you read instructions, pamphlets, or other written materials from your doctor or pharmacy?: 1 - Never  Interpreter Needed?: No  Information entered by :: Ayman Brull,LPN  Past Medical History:  Diagnosis Date  . Arthritis   . ASCVD (arteriosclerotic cardiovascular disease)   . Back pain    with radiation  . Cancer ([place])    lymphoma  . Coronary artery disease   . Hyperlipidemia   . Hypertension   . IDA (iron deficiency anemia)   . Lower leg pain   . Myocardial infarction ([place])   . Prediabetes    Past Surgical History:  Procedure Laterality Date  . AUTOGRAFT STRUCTURAL ICBG OBTAINED SEPARATE INCISION FOR SPINE SURGERY   Bilateral 07/15/2013  . BACK SURGERY    . CATARACT EXTRACTION  2010  . COLONOSCOPY  06/2017  . CORONARY ARTERY BYPASS GRAFT    . IR IMAGING GUIDED PORT INSERTION  07/31/2017  . IR REMOVAL TUN ACCESS W/ PORT W/O FL MOD SED  02/12/2018  . LAMINECTOMY LUMBAR EXCISON/EVACUATION EXTRADURAL INTRASPINAL LESION Bilateral 07/15/2013     . LAMINOTOMY REEXPLORATION POSTERIOR LUMBAR W/NERVE DECOMP & DISCECTOMY Bilateral 07/15/2013     . UPPER GI ENDOSCOPY  06/2017   Family History  Problem Relation Age of Onset  . Heart disease  Mother   . Coarctation of the aorta Brother    Social History   Socioeconomic History  . Marital status: Married    Spouse name: Not on file  . Number of children: 1  . Years of education: Not on file  . Highest education level: High school graduate  Occupational History  . Occupation: retired  Tobacco Use  . Smoking status: Former Smoker    Packs/day: 0.50    Years: 30.00    Pack years: 15.00    Types: Cigarettes    Quit date: 1983    Years since quitting: 38.0  . Smokeless tobacco: Never Used  Substance and Sexual Activity  . Alcohol use: No  . Drug use: No  .  Sexual activity: Yes  Other Topics Concern  . Not on file  Social History Narrative  . Not on file   Social Determinants of Health   Financial Resource Strain:   . Difficulty of Paying Living Expenses: Not on file  Food Insecurity:   . Worried About Charity fundraiser in the Last Year: Not on file  . Ran Out of Food in the Last Year: Not on file  Transportation Needs:   . Lack of Transportation (Medical): Not on file  . Lack of Transportation (Non-Medical): Not on file  Physical Activity:   . Days of Exercise per Week: Not on file  . Minutes of Exercise per Session: Not on file  Stress:   . Feeling of Stress : Not on file  Social Connections:   . Frequency of Communication with Friends and Family: Not on file  . Frequency of Social Gatherings with Friends and Family: Not on file  . Attends Religious Services: Not on file  . Active Member of Clubs or Organizations: Not on file  . Attends Archivist Meetings: Not on file  . Marital Status: Not on file    Outpatient Encounter Medications as of 11/29/2019  Medication Sig  . aspirin 81 MG chewable tablet Chew 81 mg by mouth daily.   . carvedilol (COREG) 3.125 MG tablet TAKE 1 TABLET TWICE A DAY WITH MEALS  . Cholecalciferol (VITAMIN D3) 2000 UNITS capsule Take 2,000 Units by mouth daily.   . Coenzyme Q10 100 MG capsule Take by mouth.  . ezetimibe (ZETIA) 10 MG tablet TAKE 1 TABLET AT BEDTIME  . Fish Oil-Cholecalciferol (FISH OIL + D3) 1000-1000 MG-UNIT CAPS Take by mouth.  Marland Kitchen lisinopril (ZESTRIL) 2.5 MG tablet TAKE 1 TABLET DAILY  . metFORMIN (GLUCOPHAGE-XR) 500 MG 24 hr tablet Take 1 tablet (500 mg total) by mouth daily with breakfast.  . rosuvastatin (CRESTOR) 5 MG tablet Take 1 tablet (5 mg total) by mouth 3 (three) times a week.  . [DISCONTINUED] fluticasone (FLONASE) 50 MCG/ACT nasal spray Place 1 spray into both nostrils daily.   . [DISCONTINUED] omeprazole (PRILOSEC) 40 MG capsule Take 40 mg by mouth daily.     No facility-administered encounter medications on file as of 11/29/2019.    Activities of Daily Living In your present state of health, do you have any difficulty performing the following activities: 11/29/2019  Hearing? Y  Comment hearing aids  Vision? N  Comment no eye dr, reading glasses  Difficulty concentrating or making decisions? N  Walking or climbing stairs? N  Dressing or bathing? N  Doing errands, shopping? N  Preparing Food and eating ? N  Using the Toilet? N  In the past six months, have you accidently leaked urine? N  Do you have problems with loss of bowel control? N  Managing your Medications? N  Managing your Finances? N  Housekeeping or managing your Housekeeping? N  Some recent data might be hidden    Patient Care Team: Olin Hauser, DO as PCP - General (Family Medicine)   Assessment:   This is a routine wellness examination for [place].  Exercise Activities and Dietary recommendations Current Exercise Habits: Home exercise routine, Type of exercise: walking, Time (Minutes): 30(1-2 miles), Frequency (Times/Week): 7, Weekly Exercise (Minutes/Week): 210, Intensity: Mild, Exercise limited by: None identified  Goals    . DIET - INCREASE WATER INTAKE     Recommend drinking at least 7-8 glasses of water a day     . Increase physical activity     Recommend increasing physical activity to 150 minutes per week        Fall Risk: Fall Risk  11/29/2019 05/16/2019 02/01/2019 11/24/2018 10/27/2018  Falls in the past year? 0 0 0 0 0  Number falls in past yr: 0 - - 1 -  Comment - - - one fall after back surgery -  Injury with Fall? 0 - - 0 -  Risk for fall due to : - - - History of fall(s) -  Follow up - Falls evaluation completed Falls evaluation completed Falls prevention discussed Falls evaluation completed    [place]:  Any stairs in or around the home? No  If so, are there any without handrails? No   Home free  of loose throw rugs in walkways, pet beds, electrical cords, etc? No  Adequate lighting in your home to reduce risk of falls? No   ASSISTIVE DEVICES UTILIZED TO PREVENT FALLS:  Life alert? No  Use of a cane, walker or w/c? No  Grab bars in the bathroom? No  Shower chair or bench in shower? No  Elevated toilet seat or a handicapped toilet? No   TIMED UP AND GO:  Unable to perform   Depression Screen PHQ 2/9 Scores 11/29/2019 05/16/2019 02/01/2019 11/24/2018  PHQ - 2 Score 0 0 0 0    Cognitive Function     6CIT Screen 11/24/2018 10/06/2017  What Year? 0 points 0 points  What month? 0 points 0 points  What time? 0 points 0 points  Count back from 20 0 points 0 points  Months in reverse 0 points 0 points  Repeat phrase 0 points 0 points  Total Score 0 0    Immunization History  Administered Date(s) Administered  . Fluad Quad(high Dose 65+) 08/16/2019  . Hepatitis B 03/17/2004  . Influenza, High Dose Seasonal PF 08/08/2015, 08/07/2016, 07/22/2017, 10/27/2018  . Influenza-Unspecified 08/29/2013, 09/28/2014, 08/08/2015, 08/07/2016  . Pneumococcal Conjugate-13 09/28/2014  . Pneumococcal Polysaccharide-23 08/07/2016  . Tdap 04/17/2014    Qualifies for Shingles Vaccine? Yes  Zostavax completed n/a. Due for Shingrix. Education has been provided regarding the importance of this vaccine. Pt has been advised to call insurance company to determine out of pocket expense. Advised may also receive vaccine at local pharmacy or Health Dept. Verbalized acceptance and understanding.  Tdap: up to date   Flu Vaccine: up to date   Pneumococcal Vaccine: up to date   Screening Tests Health Maintenance  Topic Date Due  . COLONOSCOPY  06/23/2020  . TETANUS/TDAP  04/17/2024  . INFLUENZA VACCINE  Completed  . Hepatitis C Screening  Completed  . PNA vac Low Risk Adult  Completed   Cancer Screenings:  Colorectal Screening: Completed 06/23/2017. Repeat every 3 years  Lung Cancer Screening: (Low  Dose CT Chest recommended if Age 73-80 years, 30 pack-year currently smoking OR have quit w/in 15years.) does not qualify.   Additional Screening:  Hepatitis C Screening: does qualify; Completed 2018  Vision Screening: Recommended annual ophthalmology exams for early detection of glaucoma and other disorders of the eye. Is the patient up to date with their annual eye exam?  no   Dental Screening: Recommended annual dental exams for proper oral hygiene  Community Resource Referral:  CRR required this visit?  No        Plan:  I have personally reviewed and addressed the Medicare Annual Wellness questionnaire and have noted the following in the patient's chart:  A. Medical and social history B. Use of alcohol, tobacco or illicit drugs  C. Current medications and supplements D. Functional ability and status E.  Nutritional status F.  Physical activity G. Advance directives H. List of other physicians I.  Hospitalizations, surgeries, and ER visits in previous 12 months J.  [place] such as hearing and vision if needed, cognitive and depression L. Referrals and appointments   In addition, I have reviewed and discussed with patient certain preventive protocols, quality metrics, and best practice recommendations. A written personalized care plan for preventive services as well as general preventive health recommendations were provided to patient.   Signed,   Bevelyn Ngo, LPN  075-GRM Nurse Health Advisor   Nurse Notes:  None

## 2019-11-29 NOTE — Patient Instructions (Addendum)
Mr. Corey Daniel , Thank you for taking time to come for your Medicare Wellness Visit. I appreciate your ongoing commitment to your health goals. Please review the following plan we discussed and let me know if I can assist you in the future.   Screening recommendations/referrals: Colonoscopy: completed 2018 Recommended yearly ophthalmology/optometry visit for glaucoma screening and checkup Recommended yearly dental visit for hygiene and checkup  Vaccinations: Influenza vaccine: up to date Pneumococcal vaccine: up to date Tdap vaccine: up to date  Shingles vaccine: shingrix eligible     Advanced directives: Please bring a copy of your health care power of attorney and living will to the office at your convenience.  Conditions/risks identified: none   Next appointment: Follow up in one year for your annual wellness visit   Preventive Care 65 Years and Older, Male Preventive care refers to lifestyle choices and visits with your health care provider that can promote health and wellness. What does preventive care include?  A yearly physical exam. This is also called an annual well check.  Dental exams once or twice a year.  Routine eye exams. Ask your health care provider how often you should have your eyes checked.  Personal lifestyle choices, including:  Daily care of your teeth and gums.  Regular physical activity.  Eating a healthy diet.  Avoiding tobacco and drug use.  Limiting alcohol use.  Practicing safe sex.  Taking low doses of aspirin every day.  Taking vitamin and mineral supplements as recommended by your health care provider. What happens during an annual well check? The services and screenings done by your health care provider during your annual well check will depend on your age, overall health, lifestyle risk factors, and family history of disease. Counseling  Your health care provider may ask you questions about your:  Alcohol use.  Tobacco  use.  Drug use.  Emotional well-being.  Home and relationship well-being.  Sexual activity.  Eating habits.  History of falls.  Memory and ability to understand (cognition).  Work and work Statistician. Screening  You may have the following tests or measurements:  Height, weight, and BMI.  Blood pressure.  Lipid and cholesterol levels. These may be checked every 5 years, or more frequently if you are over 46 years old.  Skin check.  Lung cancer screening. You may have this screening every year starting at age 72 if you have a 30-pack-year history of smoking and currently smoke or have quit within the past 15 years.  Fecal occult blood test (FOBT) of the stool. You may have this test every year starting at age 33.  Flexible sigmoidoscopy or colonoscopy. You may have a sigmoidoscopy every 5 years or a colonoscopy every 10 years starting at age 49.  Prostate cancer screening. Recommendations will vary depending on your family history and other risks.  Hepatitis C blood test.  Hepatitis B blood test.  Sexually transmitted disease (STD) testing.  Diabetes screening. This is done by checking your blood sugar (glucose) after you have not eaten for a while (fasting). You may have this done every 1-3 years.  Abdominal aortic aneurysm (AAA) screening. You may need this if you are a current or former smoker.  Osteoporosis. You may be screened starting at age 55 if you are at high risk. Talk with your health care provider about your test results, treatment options, and if necessary, the need for more tests. Vaccines  Your health care provider may recommend certain vaccines, such as:  Influenza vaccine.  This is recommended every year.  Tetanus, diphtheria, and acellular pertussis (Tdap, Td) vaccine. You may need a Td booster every 10 years.  Zoster vaccine. You may need this after age 67.  Pneumococcal 13-valent conjugate (PCV13) vaccine. One dose is recommended after age  71.  Pneumococcal polysaccharide (PPSV23) vaccine. One dose is recommended after age 86. Talk to your health care provider about which screenings and vaccines you need and how often you need them. This information is not intended to replace advice given to you by your health care provider. Make sure you discuss any questions you have with your health care provider. Document Released: 11/30/2015 Document Revised: 07/23/2016 Document Reviewed: 09/04/2015 Elsevier Interactive Patient Education  2017 [employer] Prevention in the Home Falls can cause injuries. They can happen to people of all ages. There are many things you can do to make your home safe and to help prevent falls. What can I do on the outside of my home?  Regularly fix the edges of walkways and driveways and fix any cracks.  Remove anything that might make you trip as you walk through a door, such as a raised step or threshold.  Trim any bushes or trees on the path to your home.  Use bright outdoor lighting.  Clear any walking paths of anything that might make someone trip, such as rocks or tools.  Regularly check to see if handrails are loose or broken. Make sure that both sides of any steps have handrails.  Any raised decks and porches should have guardrails on the edges.  Have any leaves, snow, or ice cleared regularly.  Use sand or salt on walking paths during winter.  Clean up any spills in your garage right away. This includes oil or grease spills. What can I do in the bathroom?  Use night lights.  Install grab bars by the toilet and in the tub and shower. Do not use towel bars as grab bars.  Use non-skid mats or decals in the tub or shower.  If you need to sit down in the shower, use a plastic, non-slip stool.  Keep the floor dry. Clean up any water that spills on the floor as soon as it happens.  Remove soap buildup in the tub or shower regularly.  Attach bath mats securely with double-sided  non-slip rug tape.  Do not have throw rugs and other things on the floor that can make you trip. What can I do in the bedroom?  Use night lights.  Make sure that you have a light by your bed that is easy to reach.  Do not use any sheets or blankets that are too big for your bed. They should not hang down onto the floor.  Have a firm chair that has side arms. You can use this for support while you get dressed.  Do not have throw rugs and other things on the floor that can make you trip. What can I do in the kitchen?  Clean up any spills right away.  Avoid walking on wet floors.  Keep items that you use a lot in easy-to-reach places.  If you need to reach something above you, use a strong step stool that has a grab bar.  Keep electrical cords out of the way.  Do not use floor polish or wax that makes floors slippery. If you must use wax, use non-skid floor wax.  Do not have throw rugs and other things on the floor that can make  you trip. What can I do with my stairs?  Do not leave any items on the stairs.  Make sure that there are handrails on both sides of the stairs and use them. Fix handrails that are broken or loose. Make sure that handrails are as long as the stairways.  Check any carpeting to make sure that it is firmly attached to the stairs. Fix any carpet that is loose or worn.  Avoid having throw rugs at the top or bottom of the stairs. If you do have throw rugs, attach them to the floor with carpet tape.  Make sure that you have a light switch at the top of the stairs and the bottom of the stairs. If you do not have them, ask someone to add them for you. What else can I do to help prevent falls?  Wear shoes that:  Do not have high heels.  Have rubber bottoms.  Are comfortable and fit you well.  Are closed at the toe. Do not wear sandals.  If you use a stepladder:  Make sure that it is fully opened. Do not climb a closed stepladder.  Make sure that both  sides of the stepladder are locked into place.  Ask someone to hold it for you, if possible.  Clearly mark and make sure that you can see:  Any grab bars or handrails.  First and last steps.  Where the edge of each step is.  Use tools that help you move around (mobility aids) if they are needed. These include:  Canes.  Walkers.  Scooters.  Crutches.  Turn on the lights when you go into a dark area. Replace any light bulbs as soon as they burn out.  Set up your furniture so you have a clear path. Avoid moving your furniture around.  If any of your floors are uneven, fix them.  If there are any pets around you, be aware of where they are.  Review your medicines with your doctor. Some medicines can make you feel dizzy. This can increase your chance of falling. Ask your doctor what other things that you can do to help prevent falls. This information is not intended to replace advice given to you by your health care provider. Make sure you discuss any questions you have with your health care provider. Document Released: 08/30/2009 Document Revised: 04/10/2016 Document Reviewed: 12/08/2014 Elsevier Interactive Patient Education  2017 [employer].

## 2019-11-30 ENCOUNTER — Telehealth: Payer: Self-pay

## 2019-11-30 NOTE — Telephone Encounter (Signed)
While completed annual wellness visit with patient on 11/29/2019, patient discussed needing assistance with disability from [place]. He was having a hard time getting better hearing aids due to his disability %. Information provided to patient with local [employer] number in [place] [phone] Ludger Nutting) to hopefully help patient with this information going forward. Patient very appreciative of information and will give him a call.

## 2019-12-13 DIAGNOSIS — L600 Ingrowing nail: Secondary | ICD-10-CM | POA: Diagnosis not present

## 2019-12-13 DIAGNOSIS — Z85828 Personal history of other malignant neoplasm of skin: Secondary | ICD-10-CM | POA: Diagnosis not present

## 2019-12-13 DIAGNOSIS — L538 Other specified erythematous conditions: Secondary | ICD-10-CM | POA: Diagnosis not present

## 2019-12-13 DIAGNOSIS — D225 Melanocytic nevi of trunk: Secondary | ICD-10-CM | POA: Diagnosis not present

## 2019-12-13 DIAGNOSIS — D2271 Melanocytic nevi of right lower limb, including hip: Secondary | ICD-10-CM | POA: Diagnosis not present

## 2019-12-13 DIAGNOSIS — D2262 Melanocytic nevi of left upper limb, including shoulder: Secondary | ICD-10-CM | POA: Diagnosis not present

## 2019-12-13 DIAGNOSIS — L820 Inflamed seborrheic keratosis: Secondary | ICD-10-CM | POA: Diagnosis not present

## 2019-12-19 ENCOUNTER — Other Ambulatory Visit: Payer: Self-pay

## 2019-12-19 ENCOUNTER — Other Ambulatory Visit: Payer: Self-pay | Admitting: Family Medicine

## 2019-12-19 ENCOUNTER — Ambulatory Visit (INDEPENDENT_AMBULATORY_CARE_PROVIDER_SITE_OTHER): Payer: Medicare Other | Admitting: Family Medicine

## 2019-12-19 ENCOUNTER — Encounter: Payer: Self-pay | Admitting: Family Medicine

## 2019-12-19 VITALS — BP 138/82 | HR 66 | Temp 97.8°F | Resp 16 | Ht 70.0 in | Wt 213.6 lb

## 2019-12-19 DIAGNOSIS — E782 Mixed hyperlipidemia: Secondary | ICD-10-CM

## 2019-12-19 DIAGNOSIS — R351 Nocturia: Secondary | ICD-10-CM

## 2019-12-19 DIAGNOSIS — Z862 Personal history of diseases of the blood and blood-forming organs and certain disorders involving the immune mechanism: Secondary | ICD-10-CM

## 2019-12-19 DIAGNOSIS — R7303 Prediabetes: Secondary | ICD-10-CM

## 2019-12-19 DIAGNOSIS — I10 Essential (primary) hypertension: Secondary | ICD-10-CM

## 2019-12-19 LAB — POCT GLYCOSYLATED HEMOGLOBIN (HGB A1C): Hemoglobin A1C: 6.6 % — AB (ref 4.0–5.6)

## 2019-12-19 MED ORDER — ROSUVASTATIN CALCIUM 5 MG PO TABS: 5.0000 mg | ORAL_TABLET | ORAL | 1 refills | Status: DC

## 2019-12-19 NOTE — Assessment & Plan Note (Signed)
Controlled HTN on low dose meds  Complicated by CAD    Plan:  1. Continue current BP regimen Coreg 3.125mg BID, Lisinopril 2.5mg 2. Encourage improved lifestyle - low sodium diet, regular exercise 3. Continue monitor BP outside office, bring readings to next visit, if persistently >140/90 or new symptoms notify office sooner 

## 2019-12-19 NOTE — Progress Notes (Signed)
Subjective:    Patient ID: [name], male    DOB: [birth year], 74 y.o.   MRN: [id number]  Corey Daniel is a 74 y.o. male presenting on 12/19/2019 for Pre-Diabetes   HPI   Pre-Diabetes: Resulst have increased trend with A1c 6.8 prior, due today for A1c CBGs: Not checking CBG Meds:MetforminXR500mg daily Currently on ACEi Lifestyle: Poor lifestyle - Weight down 4 lbs since December 2020 - Diet (improved diet overall) - Exercise (lately no regular walking exercise) Denies hypoglycemia, polyuria, visual changes, numbness or tingling.  CHRONIC HTN: Reportsno concerns, no recent additional BP readings. Current Meds -Lisinopril 2.5mg  daily, Carvedilol 3.125mg  BID Reports good compliance, took meds today. Tolerating well, w/o complaints. Denies CP, dyspnea, HA, edema, dizziness / lightheadedness  HYPERLIPIDEMIA: - Last lipid 07/2019 Improved overall - LDL controlled - Currently takingRosuvastatin intermittentMon-Weds-Friand Zetia - Now he reports having persistent myalgia and muscle joint aches, he attributes to the Statin medicine still on x 3 weekly.  PMH - LymphomaGastric (Diffuse Large B-Cell Lymphoma) / Thrombocytopenia    Depression screen [facility] 2/9 12/19/2019 11/29/2019 05/16/2019  Decreased Interest 0 0 0  Down, Depressed, Hopeless 0 0 0  PHQ - 2 Score 0 0 0    Social History   Tobacco Use  . Smoking status: Former Smoker    Packs/day: 0.50    Years: 30.00    Pack years: 15.00    Types: Cigarettes    Quit date: 1983    Years since quitting: 38.1  . Smokeless tobacco: Never Used  Substance Use Topics  . Alcohol use: No  . Drug use: No    Review of Systems Per HPI unless specifically indicated above     Objective:    BP 138/82   Pulse 66   Temp 97.8 F (36.6 C) (Oral)   Resp 16   Ht 5\' 10"  (1.778 m)   Wt 213 lb 9.6 oz (96.9 kg)   BMI 30.65 kg/m   Wt Readings from Last 3 Encounters:  12/19/19 213 lb 9.6 oz (96.9 kg)  11/29/19 210 lb  (95.3 kg)  10/20/19 217 lb 3.2 oz (98.5 kg)    Physical Exam Vitals and nursing note reviewed.  Constitutional:      General: He is not in acute distress.    Appearance: He is well-developed. He is not diaphoretic.     Comments: Well-appearing, comfortable, cooperative  HENT:     Head: Normocephalic and atraumatic.  Eyes:     General:        Right eye: No discharge.        Left eye: No discharge.     Conjunctiva/sclera: Conjunctivae normal.  Cardiovascular:     Rate and Rhythm: Normal rate.  Pulmonary:     Effort: Pulmonary effort is normal.  Skin:    General: Skin is warm and dry.     Findings: No erythema or rash.  Neurological:     Mental Status: He is alert and oriented to person, place, and time.  Psychiatric:        Behavior: Behavior normal.     Comments: Well groomed, good eye contact, normal speech and thoughts       Recent Labs    05/16/19 1126 08/11/19 0805 12/19/19 0829  HGBA1C 6.1* 6.7* 6.6*     Results for orders placed or performed in visit on 12/19/19  POCT HgB A1C  Result Value Ref Range   Hemoglobin A1C 6.6 (A) 4.0 - 5.6 %  Assessment & Plan:   Problem List Items Addressed This Visit    Pre-diabetes - Primary    Slightly improved but still significantly elevated A1c at 6.6 goal to improve lifestyle Limited lifestyle currently Concern with HTN, HLD, CAD  Plan:  1. Take Metformin XR 500mg  1-2 x daily 2. Encourage improved lifestyle - keto / low carb diet, keep improving exercise  F/u 6 months A1c trend, discussion on diagnosis for Type 2 Diabetes, since improved and nearly 6.5 will defer until next reading      Relevant Orders   POCT HgB A1C (Completed)   Mixed hyperlipidemia    Controlled cholesterol on intermittent statin / zetia and lifestyle Now with statin myalgias again Last lipid panel 07/2019 With CAD  Plan: 1. REDUCE Rosuvastatin from 5mg  TID down to ONCE WEEKLY new rx sent - Continue Zetia 10mg  2. Continue ASA 81mg   for secondary ASCVD risk reduction 3. Encourage improved lifestyle - low carb/cholesterol, reduce portion size, continue improving regular exercise      Relevant Medications   rosuvastatin (CRESTOR) 5 MG tablet   Essential hypertension    Controlled HTN on low dose meds  Complicated by CAD    Plan:  1. Continue current BP regimen Coreg 3.125mg  BID, Lisinopril 2.5mg  2. Encourage improved lifestyle - low sodium diet, regular exercise 3. Continue monitor BP outside office, bring readings to next visit, if persistently >140/90 or new symptoms notify office sooner      Relevant Medications   rosuvastatin (CRESTOR) 5 MG tablet      Meds ordered this encounter  Medications  . rosuvastatin (CRESTOR) 5 MG tablet    Sig: Take 1 tablet (5 mg total) by mouth once a week.    Dispense:  12 tablet    Refill:  1    Please change rx instructions to 1 x weekly now, instead of 3 x week. Do not fill until patient requests/ready for new fill.      Follow up plan: Return in about 7 months (around 07/18/2020) for 7 month Yearly Medicare Checkup.  Future labs ordered for 07/24/20  [name], [facility] 12/19/2019, 8:13 AM

## 2019-12-19 NOTE — Patient Instructions (Addendum)
Thank you for coming to the office today.  STOP Rosuvastatin 5mg  for 2 weeks, let body rest, then RESTART ONCE WEEKLY.  Continue Zetia.  Continue Metformin XR  Recent Labs    05/16/19 1126 08/11/19 0805 12/19/19 0829  HGBA1C 6.1* 6.7* 6.6*   Goal to get A1c < 6.5  DUE for FASTING BLOOD WORK (no food or drink after midnight before the lab appointment, only water or coffee without cream/sugar on the morning of)  SCHEDULE "Lab Only" visit in the morning at the clinic for lab draw in 7 MONTHS   - Make sure Lab Only appointment is at about 1 week before your next appointment, so that results will be available  For Lab Results, once available within 2-3 days of blood draw, you can can log in to MyChart online to view your results and a brief explanation. Also, we can discuss results at next follow-up visit.    Please schedule a Follow-up Appointment to: Return in about 7 months (around 07/18/2020) for 7 month Yearly Medicare Checkup.  If you have any other questions or concerns, please feel free to call the office or send a message through [facility]. You may also schedule an earlier appointment if necessary.  Additionally, you may be receiving a survey about your experience at our office within a few days to 1 week by e-mail or mail. We value your feedback.  [name], DO [place]

## 2019-12-19 NOTE — Assessment & Plan Note (Signed)
Controlled cholesterol on intermittent statin / zetia and lifestyle Now with statin myalgias again Last lipid panel 07/2019 With CAD  Plan: 1. REDUCE Rosuvastatin from 5mg  TID down to ONCE WEEKLY new rx sent - Continue Zetia 10mg  2. Continue ASA 81mg  for secondary ASCVD risk reduction 3. Encourage improved lifestyle - low carb/cholesterol, reduce portion size, continue improving regular exercise

## 2019-12-19 NOTE — Assessment & Plan Note (Signed)
Slightly improved but still significantly elevated A1c at 6.6 goal to improve lifestyle Limited lifestyle currently Concern with HTN, HLD, CAD  Plan:  1. Take Metformin XR 500mg  1-2 x daily 2. Encourage improved lifestyle - keto / low carb diet, keep improving exercise  F/u 6 months A1c trend, discussion on diagnosis for Type 2 Diabetes, since improved and nearly 6.5 will defer until next reading

## 2019-12-27 IMAGING — CR CHEST - 2 VIEW
2 series · 2 of 2 positions shown · non-contrast
Comparison: Radiographs October 19, 2018.

CLINICAL DATA: Right lung nodule.

EXAM:
CHEST - 2 VIEW

[chest pa]
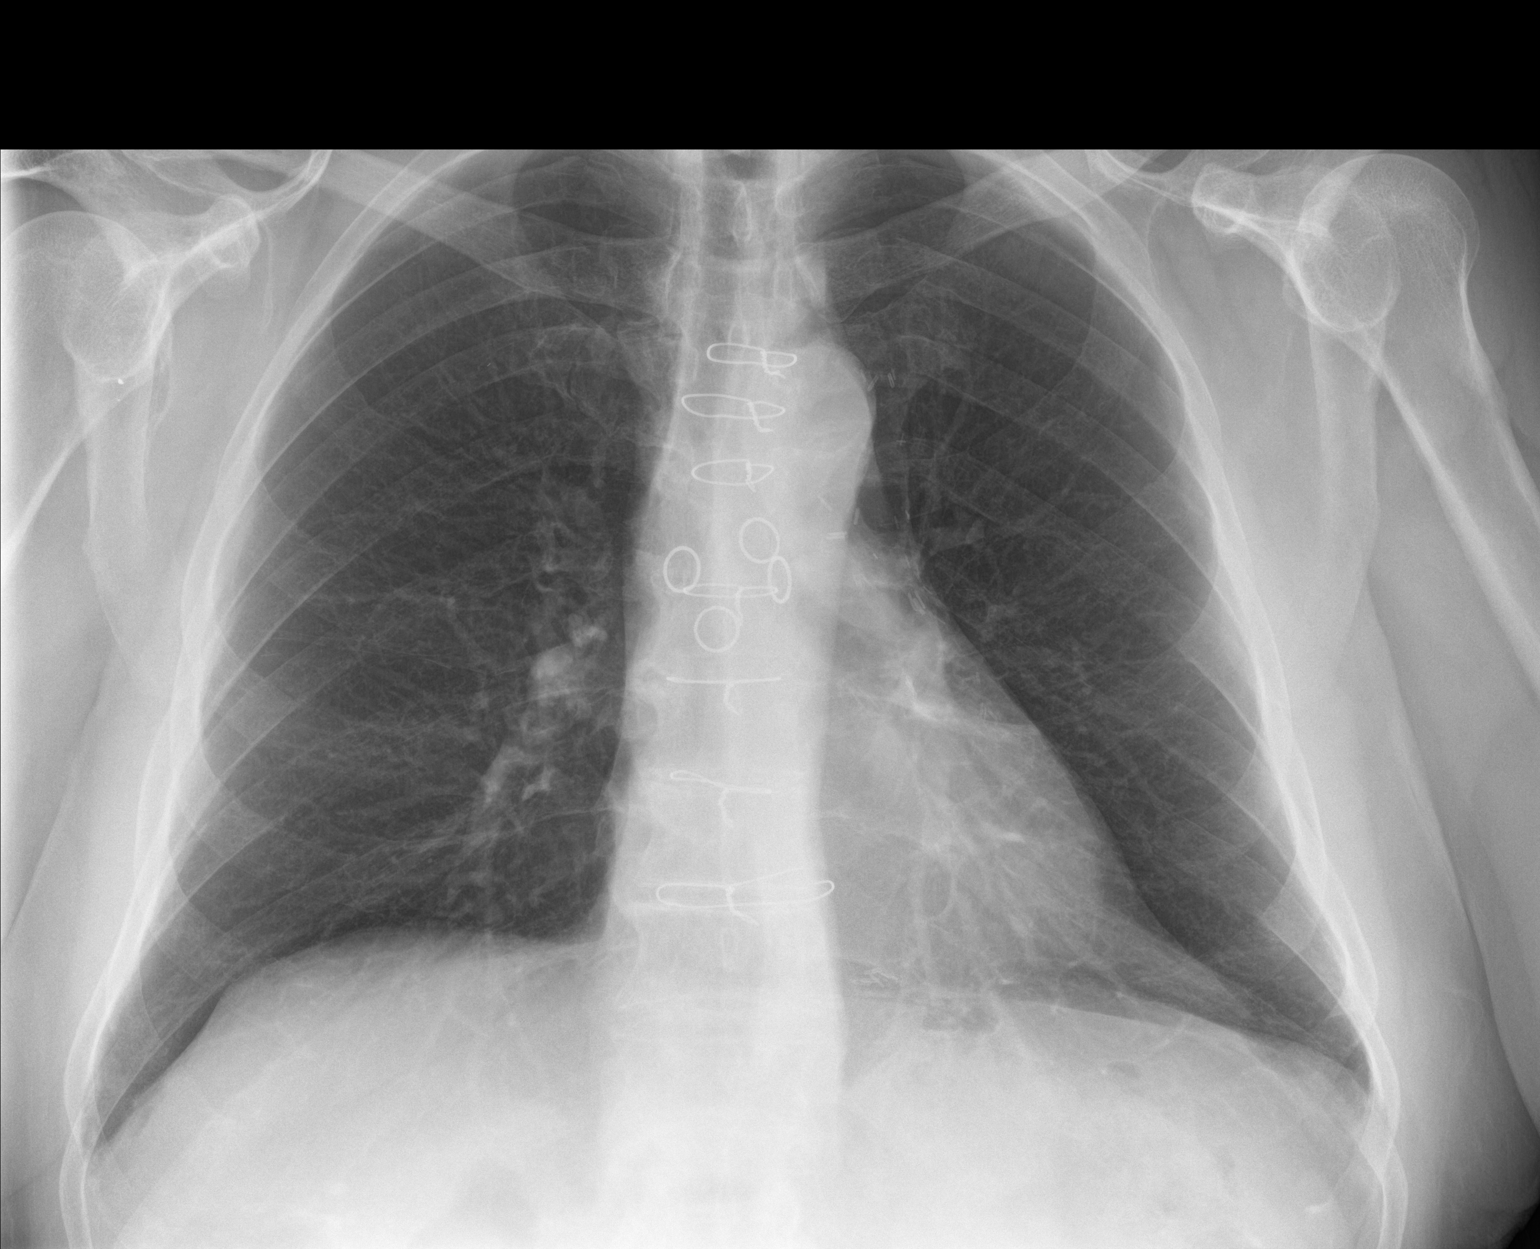

[chest lat]
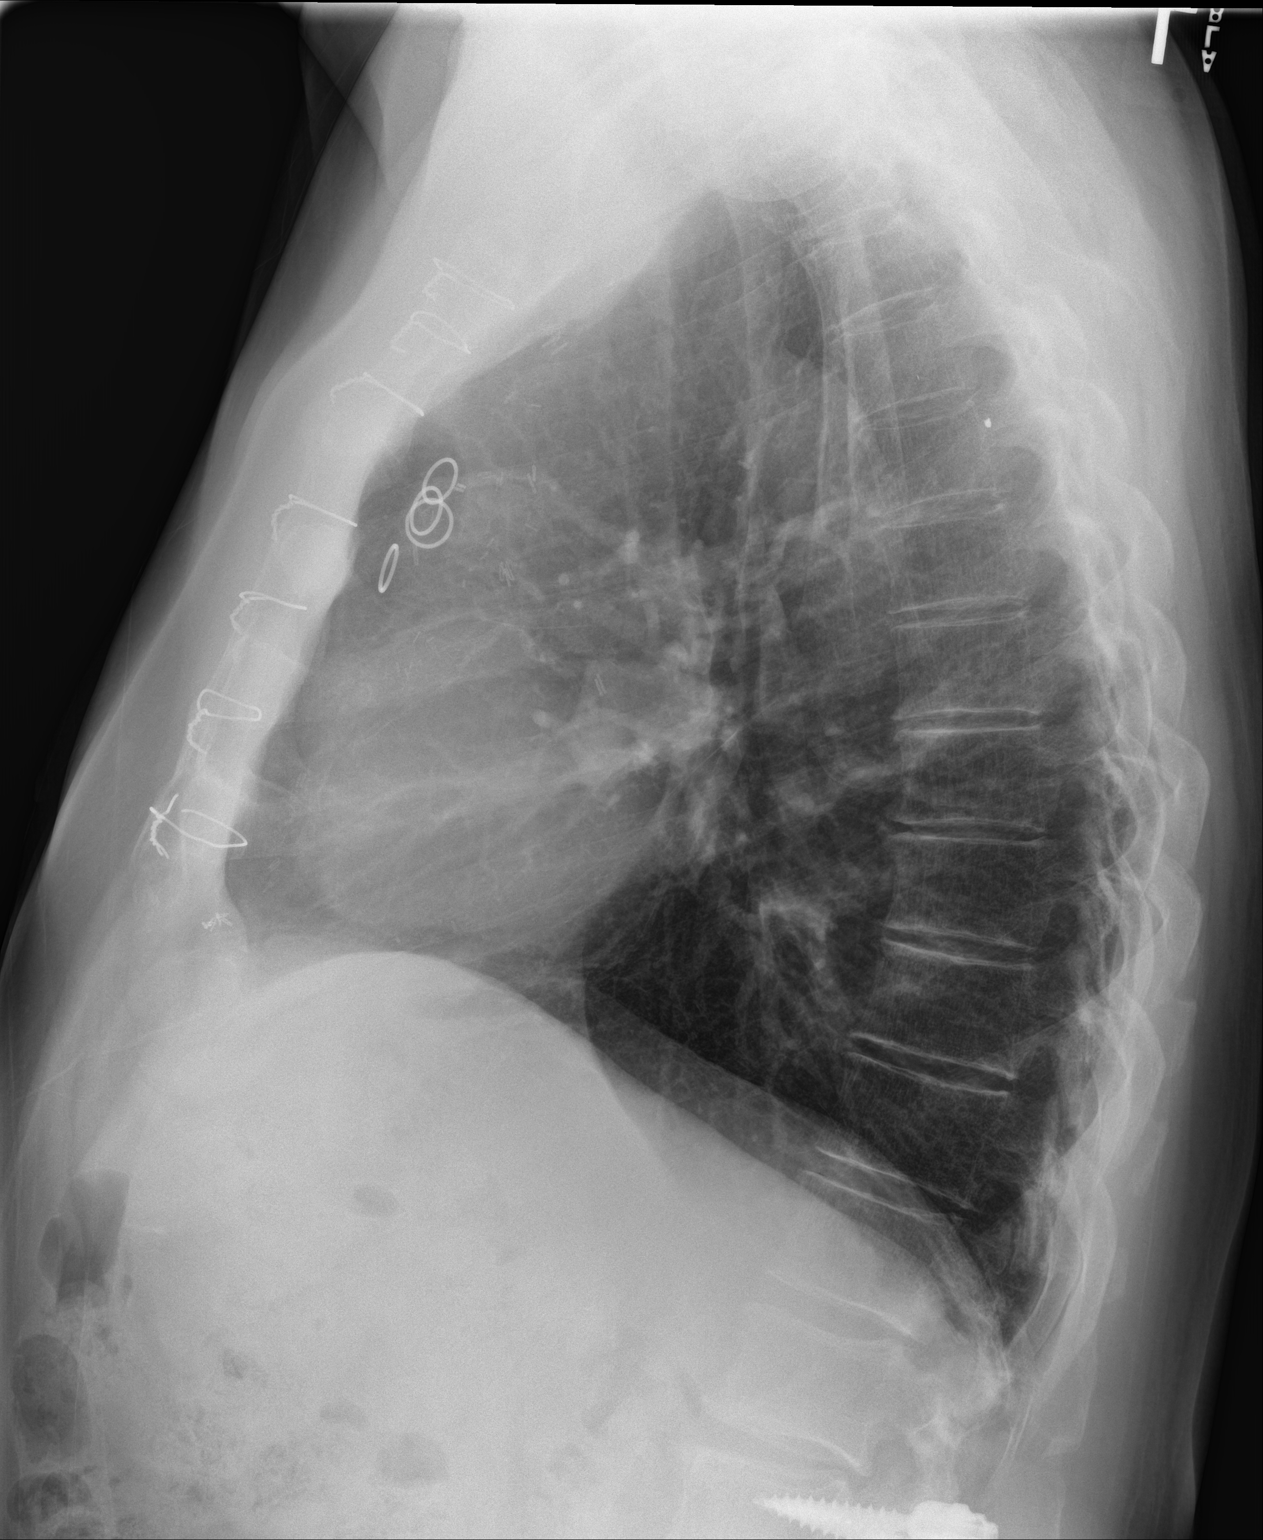

[2 of 2 positions shown; findings below may reference images not displayed]

FINDINGS: The heart size and mediastinal contours are within normal limits.
Both lungs are clear. No pneumothorax or pleural effusion is noted.
Status post coronary artery bypass graft. The visualized skeletal
structures are unremarkable.
IMPRESSION: No active cardiopulmonary disease.

## 2020-01-04 DIAGNOSIS — E782 Mixed hyperlipidemia: Secondary | ICD-10-CM | POA: Diagnosis not present

## 2020-01-04 DIAGNOSIS — I10 Essential (primary) hypertension: Secondary | ICD-10-CM | POA: Diagnosis not present

## 2020-01-04 DIAGNOSIS — E119 Type 2 diabetes mellitus without complications: Secondary | ICD-10-CM | POA: Diagnosis not present

## 2020-01-04 DIAGNOSIS — I2510 Atherosclerotic heart disease of native coronary artery without angina pectoris: Secondary | ICD-10-CM | POA: Diagnosis not present

## 2020-04-07 ENCOUNTER — Other Ambulatory Visit: Payer: Self-pay | Admitting: Family Medicine

## 2020-04-07 DIAGNOSIS — I2510 Atherosclerotic heart disease of native coronary artery without angina pectoris: Secondary | ICD-10-CM

## 2020-04-07 DIAGNOSIS — I10 Essential (primary) hypertension: Secondary | ICD-10-CM

## 2020-04-19 ENCOUNTER — Other Ambulatory Visit: Payer: Self-pay

## 2020-04-19 ENCOUNTER — Other Ambulatory Visit: Payer: Self-pay | Admitting: *Deleted

## 2020-04-19 ENCOUNTER — Encounter: Payer: Self-pay | Admitting: Internal Medicine

## 2020-04-19 ENCOUNTER — Inpatient Hospital Stay: Payer: Medicare Other | Attending: Internal Medicine

## 2020-04-19 ENCOUNTER — Inpatient Hospital Stay (HOSPITAL_BASED_OUTPATIENT_CLINIC_OR_DEPARTMENT_OTHER): Payer: Medicare Other | Admitting: Internal Medicine

## 2020-04-19 DIAGNOSIS — Z7984 Long term (current) use of oral hypoglycemic drugs: Secondary | ICD-10-CM | POA: Insufficient documentation

## 2020-04-19 DIAGNOSIS — Z8572 Personal history of non-Hodgkin lymphomas: Secondary | ICD-10-CM | POA: Diagnosis not present

## 2020-04-19 DIAGNOSIS — M129 Arthropathy, unspecified: Secondary | ICD-10-CM | POA: Diagnosis not present

## 2020-04-19 DIAGNOSIS — I252 Old myocardial infarction: Secondary | ICD-10-CM | POA: Insufficient documentation

## 2020-04-19 DIAGNOSIS — I10 Essential (primary) hypertension: Secondary | ICD-10-CM | POA: Diagnosis not present

## 2020-04-19 DIAGNOSIS — Z7982 Long term (current) use of aspirin: Secondary | ICD-10-CM | POA: Diagnosis not present

## 2020-04-19 DIAGNOSIS — Z87891 Personal history of nicotine dependence: Secondary | ICD-10-CM | POA: Insufficient documentation

## 2020-04-19 DIAGNOSIS — I2510 Atherosclerotic heart disease of native coronary artery without angina pectoris: Secondary | ICD-10-CM

## 2020-04-19 DIAGNOSIS — C8599 Non-Hodgkin lymphoma, unspecified, extranodal and solid organ sites: Secondary | ICD-10-CM

## 2020-04-19 DIAGNOSIS — G8929 Other chronic pain: Secondary | ICD-10-CM | POA: Diagnosis not present

## 2020-04-19 DIAGNOSIS — M549 Dorsalgia, unspecified: Secondary | ICD-10-CM | POA: Diagnosis not present

## 2020-04-19 DIAGNOSIS — Z79899 Other long term (current) drug therapy: Secondary | ICD-10-CM | POA: Diagnosis not present

## 2020-04-19 DIAGNOSIS — E785 Hyperlipidemia, unspecified: Secondary | ICD-10-CM | POA: Insufficient documentation

## 2020-04-19 LAB — COMPREHENSIVE METABOLIC PANEL
ALT: 25 U/L (ref 0–44)
AST: 24 U/L (ref 15–41)
Albumin: 4.3 g/dL (ref 3.5–5.0)
Alkaline Phosphatase: 48 U/L (ref 38–126)
Anion gap: 10 (ref 5–15)
BUN: 24 mg/dL — ABNORMAL HIGH (ref 8–23)
CO2: 25 mmol/L (ref 22–32)
Calcium: 9.3 mg/dL (ref 8.9–10.3)
Chloride: 104 mmol/L (ref 98–111)
Creatinine, Ser: 1.06 mg/dL (ref 0.61–1.24)
GFR calc Af Amer: >60 mL/min (ref >60)
GFR calc non Af Amer: >60 mL/min (ref >60)
Glucose, Bld: 115 mg/dL — ABNORMAL HIGH (ref 70–99)
Potassium: 4.3 mmol/L (ref 3.5–5.1)
Sodium: 139 mmol/L (ref 135–145)
Total Bilirubin: 1 mg/dL (ref 0.3–1.2)
Total Protein: 7.1 g/dL (ref 6.5–8.1)

## 2020-04-19 LAB — CBC WITH DIFFERENTIAL/PLATELET
Abs Immature Granulocytes: 0.02 10*3/uL (ref 0.00–0.07)
Basophils Absolute: 0.1 10*3/uL (ref 0.0–0.1)
Basophils Relative: 1 %
Eosinophils Absolute: 0.2 10*3/uL (ref 0.0–0.5)
Eosinophils Relative: 4 %
HCT: 39.6 % (ref 39.0–52.0)
Hemoglobin: 13.7 g/dL (ref 13.0–17.0)
Immature Granulocytes: 0 %
Lymphocytes Relative: 21 %
Lymphs Abs: 1.1 10*3/uL (ref 0.7–4.0)
MCH: 31.4 pg (ref 26.0–34.0)
MCHC: 34.6 g/dL (ref 30.0–36.0)
MCV: 90.8 fL (ref 80.0–100.0)
Monocytes Absolute: 0.6 10*3/uL (ref 0.1–1.0)
Monocytes Relative: 13 %
Neutro Abs: 3.2 10*3/uL (ref 1.7–7.7)
Neutrophils Relative %: 61 %
Platelets: 148 10*3/uL — ABNORMAL LOW (ref 150–400)
RBC: 4.36 MIL/uL (ref 4.22–5.81)
RDW: 13.6 % (ref 11.5–15.5)
WBC: 5.1 10*3/uL (ref 4.0–10.5)
nRBC: 0 % (ref 0.0–0.2)

## 2020-04-19 LAB — LACTATE DEHYDROGENASE: LDH: 134 U/L (ref 98–192)

## 2020-04-19 NOTE — Assessment & Plan Note (Addendum)
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  Status post chemotherapy followed by radiation; February 2019 PET scan CR; upper endoscopy March 2019 normal as per patient.STABLE.   # Clinically, no evidence of recurrence.  Continue surveillance every 6 months on a clinical basis without any imaging  #Chronic back pain s/p back surgery- STABLE.    # DISPOSITION: print copy of labs # follow up in 6 months- MD-labs-cbc/cmp/ldh; Dr.B

## 2020-04-19 NOTE — Progress Notes (Signed)
[facility] OFFICE PROGRESS NOTE  Patient Care Team: Olin Hauser, DO as PCP - General (Family Medicine)  Cancer Staging No matching staging information was found for the patient.   Oncology History Overview Note  # AUG 2018- DIFFUSE LARGE B CELL LYMPHOMA STOMACH-STAGE IE [BMBx-NEG]Fundus ;CD-20 pos;variable- bcl6/mum-?? GCB vs. ABC subtype;  [EGD for IDA; Dr.Hung; [place]]- NEG for FISH for;myc; bcl-6; bcl-2;11:14; MALT [8q21];NEG for CD-10; ki-67-70%; SEP PET- uptake in stomach. Hepatitis panel-NEG; BMBx-NEG  # SEP 25th 2018- R-CHOP x3 ; RT [until jan 3rd 2019]; PET Feb 2019- CR; s/p EGD- NEG [March 2019]  # AUG 2018- H.pylori positive/stomach body.   # IDA-resolved  # CAD [s/p CABG; Dr.fath];MUGA scan [sep 2018]- 56.5%; port explanted.  ---------------------------------------------------------------   DIAGNOSIS: [ aug 2018]- DLBCL OD STOMACH  STAGE: IE ; GOALS: curative  CURRENT/MOST RECENT THERAPY;  surveillance    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[place])      INTERVAL HISTORY:  Corey Daniel 74 y.o.  male pleasant patient above history of diffuse large B-cell lymphoma of the stomach is here for follow-up.  Patient denies any abdominal pain nausea vomiting but denies any blood in stools or black or stools.  Continues to have chronic back pain post surgery.  Not getting any worse.  Review of Systems  Constitutional: Negative for chills, diaphoresis, fever, malaise/fatigue and weight loss.  HENT: Negative for nosebleeds and sore throat.   Eyes: Negative for double vision.  Respiratory: Negative for cough, hemoptysis, sputum production, shortness of breath and wheezing.   Cardiovascular: Negative for chest pain, palpitations, orthopnea and leg swelling.  Gastrointestinal: Negative for abdominal pain, blood in stool, constipation, diarrhea, heartburn, melena, nausea and vomiting.  Genitourinary: Negative for dysuria, frequency and urgency.  Musculoskeletal:  Positive for back pain. Negative for joint pain.  Skin: Negative.  Negative for itching and rash.  Neurological: Negative for dizziness, tingling, focal weakness, weakness and headaches.  Endo/Heme/Allergies: Does not bruise/bleed easily.  Psychiatric/Behavioral: Negative for depression. The patient is not nervous/anxious and does not have insomnia.       PAST MEDICAL HISTORY :  Past Medical History:  Diagnosis Date  . Arthritis   . ASCVD (arteriosclerotic cardiovascular disease)   . Back pain    with radiation  . Cancer ([place])    lymphoma  . Coronary artery disease   . Hyperlipidemia   . Hypertension   . IDA (iron deficiency anemia)   . Lower leg pain   . Myocardial infarction ([place])   . Prediabetes     PAST SURGICAL HISTORY :   Past Surgical History:  Procedure Laterality Date  . AUTOGRAFT STRUCTURAL ICBG OBTAINED SEPARATE INCISION FOR SPINE SURGERY   Bilateral 07/15/2013  . BACK SURGERY    . CATARACT EXTRACTION  2010  . COLONOSCOPY  06/2017  . CORONARY ARTERY BYPASS GRAFT    . IR IMAGING GUIDED PORT INSERTION  07/31/2017  . IR REMOVAL TUN ACCESS W/ PORT W/O FL MOD SED  02/12/2018  . LAMINECTOMY LUMBAR EXCISON/EVACUATION EXTRADURAL INTRASPINAL LESION Bilateral 07/15/2013     . LAMINOTOMY REEXPLORATION POSTERIOR LUMBAR W/NERVE DECOMP & DISCECTOMY Bilateral 07/15/2013     . UPPER GI ENDOSCOPY  06/2017    FAMILY HISTORY :   Family History  Problem Relation Age of Onset  . Heart disease Mother   . Coarctation of the aorta Brother     SOCIAL HISTORY:   Social History   Tobacco Use  . Smoking status: Former Smoker    Packs/day: 0.50  Years: 30.00    Pack years: 15.00    Types: Cigarettes    Quit date: 5    Years since quitting: 38.4  . Smokeless tobacco: Never Used  Substance Use Topics  . Alcohol use: No  . Drug use: No    ALLERGIES:  is allergic to tramadol.  MEDICATIONS:  Current Outpatient Medications  Medication Sig Dispense Refill  . aspirin 81 MG  chewable tablet Chew 81 mg by mouth daily.     . carvedilol (COREG) 3.125 MG tablet TAKE 1 TABLET TWICE A DAY WITH MEALS 180 tablet 0  . Cholecalciferol (VITAMIN D3) 2000 UNITS capsule Take 2,000 Units by mouth daily.     . Coenzyme Q10 100 MG capsule Take by mouth.    . ezetimibe (ZETIA) 10 MG tablet TAKE 1 TABLET AT BEDTIME 90 tablet 3  . Fish Oil-Cholecalciferol (FISH OIL + D3) 1000-1000 MG-UNIT CAPS Take by mouth.    Marland Kitchen lisinopril (ZESTRIL) 2.5 MG tablet TAKE 1 TABLET DAILY 90 tablet 3  . metFORMIN (GLUCOPHAGE-XR) 500 MG 24 hr tablet Take 1 tablet (500 mg total) by mouth daily with breakfast. 90 tablet 3  . rosuvastatin (CRESTOR) 5 MG tablet Take 1 tablet (5 mg total) by mouth once a week. 12 tablet 1   No current facility-administered medications for this visit.    PHYSICAL EXAMINATION: ECOG PERFORMANCE STATUS: 0 - Asymptomatic  BP 134/83 (BP Location: Left Arm, Patient Position: Sitting)   Pulse 64   Temp (!) 97.2 F (36.2 C) (Tympanic)   Resp 18   Wt 206 lb 6.4 oz (93.6 kg)   BMI 29.62 kg/m   Filed Weights   04/19/20 1109  Weight: 206 lb 6.4 oz (93.6 kg)   Physical Exam  Constitutional: He is oriented to person, place, and time and well-developed, well-nourished, and in no distress.  HENT:  Head: Normocephalic and atraumatic.  Mouth/Throat: Oropharynx is clear and moist. No oropharyngeal exudate.  Eyes: Pupils are equal, round, and reactive to light.  Cardiovascular: Normal rate and regular rhythm.  Pulmonary/Chest: No respiratory distress. He has no wheezes.  Abdominal: Soft. Bowel sounds are normal. He exhibits no distension and no mass. There is no abdominal tenderness. There is no rebound and no guarding.  Musculoskeletal:        General: No tenderness or edema. Normal range of motion.     Cervical back: Normal range of motion and neck supple.  Neurological: He is alert and oriented to person, place, and time.  Skin: Skin is warm.  Psychiatric: Affect normal.        LABORATORY DATA:  I have reviewed the data as listed    Component Value Date/Time   NA 139 04/19/2020 1034   NA 139 02/06/2016 0806   K 4.3 04/19/2020 1034   CL 104 04/19/2020 1034   CO2 25 04/19/2020 1034   GLUCOSE 115 (H) 04/19/2020 1034   BUN 24 (H) 04/19/2020 1034   BUN 18 02/06/2016 0806   CREATININE 1.06 04/19/2020 1034   CREATININE 1.10 08/11/2019 0805   CALCIUM 9.3 04/19/2020 1034   PROT 7.1 04/19/2020 1034   PROT 7.1 02/06/2016 0806   ALBUMIN 4.3 04/19/2020 1034   ALBUMIN 4.7 02/06/2016 0806   AST 24 04/19/2020 1034   ALT 25 04/19/2020 1034   ALKPHOS 48 04/19/2020 1034   BILITOT 1.0 04/19/2020 1034   BILITOT 0.5 02/06/2016 0806   GFRNONAA >60 04/19/2020 1034   GFRNONAA 66 08/11/2019 0805   GFRAA >60 04/19/2020  [place] 08/11/2019 0805    No results found for: SPEP, UPEP  Lab Results  Component Value Date   WBC 5.1 04/19/2020   NEUTROABS 3.2 04/19/2020   HGB 13.7 04/19/2020   HCT 39.6 04/19/2020   MCV 90.8 04/19/2020   PLT 148 (L) 04/19/2020      Chemistry      Component Value Date/Time   NA 139 04/19/2020 1034   NA 139 02/06/2016 0806   K 4.3 04/19/2020 1034   CL 104 04/19/2020 1034   CO2 25 04/19/2020 1034   BUN 24 (H) 04/19/2020 1034   BUN 18 02/06/2016 0806   CREATININE 1.06 04/19/2020 1034   CREATININE 1.10 08/11/2019 0805      Component Value Date/Time   CALCIUM 9.3 04/19/2020 1034   ALKPHOS 48 04/19/2020 1034   AST 24 04/19/2020 1034   ALT 25 04/19/2020 1034   BILITOT 1.0 04/19/2020 1034   BILITOT 0.5 02/06/2016 0806       RADIOGRAPHIC STUDIES: I have personally reviewed the radiological images as listed and agreed with the findings in the report. No results found.   ASSESSMENT & PLAN:  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[place]) #Diffuse large B-cell lymphoma of the stomach; stage I E.  Status post chemotherapy followed by radiation; February 2019 PET scan CR; upper endoscopy March 2019 normal as per patient.STABLE.   #  Clinically, no evidence of recurrence.  Continue surveillance every 6 months on a clinical basis without any imaging  #Chronic back pain s/p back surgery- STABLE.    # DISPOSITION: print copy of labs # follow up in 6 months- MD-labs-cbc/cmp/ldh; Dr.B     No orders of the defined types were placed in this encounter.  All questions were answered. The patient knows to call the clinic with any problems, questions or concerns.      Cammie Sickle, MD 04/19/2020 11:26 AM

## 2020-04-19 NOTE — Progress Notes (Signed)
Pt in for follow up, denies any concerns. Pt reports going to travel [place] country in motor home for the next 6 weeks.

## 2020-04-24 ENCOUNTER — Other Ambulatory Visit: Payer: Self-pay | Admitting: Family Medicine

## 2020-04-24 DIAGNOSIS — I10 Essential (primary) hypertension: Secondary | ICD-10-CM

## 2020-04-24 NOTE — Telephone Encounter (Signed)
Requested Prescriptions  Pending Prescriptions Disp Refills  . lisinopril (ZESTRIL) 2.5 MG tablet [Pharmacy Med Name: LISINOPRIL TABS 2.5MG ] 90 tablet 0    Sig: TAKE 1 TABLET DAILY     Cardiovascular:  ACE Inhibitors Failed - 04/24/2020  3:28 AM      Failed - Valid encounter within last 6 months    Recent Outpatient Visits          4 months ago Pre-diabetes   [facility] [place], Devonne Doughty, DO   8 months ago Pre-diabetes   [facility] Olin Hauser, DO   11 months ago Pre-diabetes   [facility] [place], Devonne Doughty, DO   1 year ago [place], Devonne Doughty, DO   1 year ago [place], DO             Passed - Cr in normal range and within 180 days    Creat  Date Value Ref Range Status  08/11/2019 1.10 0.70 - 1.18 mg/dL Final    Comment:    For patients >56 years of age, the reference limit for Creatinine is approximately 13% higher for people identified as African-American. .    Creatinine, Ser  Date Value Ref Range Status  04/19/2020 1.06 0.61 - 1.24 mg/dL Final         Passed - K in normal range and within 180 days    Potassium  Date Value Ref Range Status  04/19/2020 4.3 3.5 - 5.1 mmol/L Final         Passed - Patient is not pregnant      Passed - Last BP in normal range    BP Readings from Last 1 Encounters:  04/19/20 134/83         Valid encounter 4 months ago.

## 2020-04-29 ENCOUNTER — Other Ambulatory Visit: Payer: Self-pay | Admitting: Family Medicine

## 2020-04-29 DIAGNOSIS — R7303 Prediabetes: Secondary | ICD-10-CM

## 2020-04-29 NOTE — Telephone Encounter (Signed)
Requested Prescriptions  Pending Prescriptions Disp Refills  . metFORMIN (GLUCOPHAGE-XR) 500 MG 24 hr tablet [Pharmacy Med Name: METFORMIN HCL ER TABS 500MG] 90 tablet 3    Sig: TAKE 1 TABLET DAILY WITH BREAKFAST     Endocrinology:  Diabetes - Biguanides Failed - 04/29/2020  5:46 AM      Failed - Valid encounter within last 6 months    Recent Outpatient Visits          4 months ago Pre-diabetes   [facility] [place], Devonne Doughty, DO   8 months ago Pre-diabetes   [facility] Olin Hauser, DO   11 months ago Pre-diabetes   [facility] [place], Devonne Doughty, DO   1 year ago [place], Devonne Doughty, DO   1 year ago [place], DO             Passed - Cr in normal range and within 360 days    Creat  Date Value Ref Range Status  08/11/2019 1.10 0.70 - 1.18 mg/dL Final    Comment:    For patients >56 years of age, the reference limit for Creatinine is approximately 13% higher for people identified as African-American. .    Creatinine, Ser  Date Value Ref Range Status  04/19/2020 1.06 0.61 - 1.24 mg/dL Final         Passed - HBA1C is between 0 and 7.9 and within 180 days    Hemoglobin A1C  Date Value Ref Range Status  12/19/2019 6.6 (A) 4.0 - 5.6 % Final   Hgb A1c MFr Bld  Date Value Ref Range Status  08/11/2019 6.7 (H) <5.7 % of total Hgb Final    Comment:    For someone without known diabetes, a hemoglobin A1c value of 6.5% or greater indicates that they may have  diabetes and this should be confirmed with a follow-up  test. . For someone with known diabetes, a value <7% indicates  that their diabetes is well controlled and a value  greater than or equal to 7% indicates suboptimal  control. A1c targets should be individualized based on  duration of diabetes, age, comorbid conditions, and  other  considerations. . Currently, no consensus exists regarding use of hemoglobin A1c for diagnosis of diabetes for children. .          Passed - eGFR in normal range and within 360 days    GFR, Est African American  Date Value Ref Range Status  08/11/2019 77 > OR = 60 mL/min/1.52m Final   GFR calc Af Amer  Date Value Ref Range Status  04/19/2020 >60 >60 mL/min Final   GFR, Est Non African American  Date Value Ref Range Status  08/11/2019 66 > OR = 60 mL/min/1.749mFinal   GFR calc non Af Amer  Date Value Ref Range Status  04/19/2020 >60 >60 mL/min Final         Valid encounter 4 months ago.

## 2020-06-12 ENCOUNTER — Other Ambulatory Visit: Payer: Self-pay | Admitting: Family Medicine

## 2020-06-12 DIAGNOSIS — E782 Mixed hyperlipidemia: Secondary | ICD-10-CM

## 2020-06-12 NOTE — Telephone Encounter (Signed)
Requested Prescriptions  Pending Prescriptions Disp Refills   ezetimibe (ZETIA) 10 MG tablet [Pharmacy Med Name: EZETIMIBE TABS 10MG ] 90 tablet 0    Sig: TAKE 1 TABLET AT BEDTIME     Cardiovascular:  Antilipid - Sterol Transport Inhibitors Passed - 06/12/2020  3:21 AM      Passed - Total Cholesterol in normal range and within 360 days    Cholesterol, Total  Date Value Ref Range Status  02/06/2016 218 (H) 100 - 199 mg/dL Final   Cholesterol  Date Value Ref Range Status  08/11/2019 159 <200 mg/dL Final         Passed - LDL in normal range and within 360 days    LDL Cholesterol (Calc)  Date Value Ref Range Status  08/11/2019 84 mg/dL (calc) Final    Comment:    Reference range: <100 . Desirable range <100 mg/dL for primary prevention;   <70 mg/dL for patients with CHD or diabetic patients  with > or = 2 CHD risk factors. Marland Kitchen LDL-C is now calculated using the Martin-[place]  calculation, which is a validated novel method providing  better accuracy than the Friedewald equation in the  estimation of LDL-C.  Cresenciano Genre et al. Annamaria Helling. 6063;016(01): 2061-2068  (http://education.[employer].com/faq/FAQ164)          Passed - HDL in normal range and within 360 days    HDL  Date Value Ref Range Status  08/11/2019 52 > OR = 40 mg/dL Final  02/06/2016 40 >39 mg/dL Final         Passed - Triglycerides in normal range and within 360 days    Triglycerides  Date Value Ref Range Status  08/11/2019 133 <150 mg/dL Final         Passed - Valid encounter within last 12 months    Recent Outpatient Visits          5 months ago Pre-diabetes   [facility] [place], Devonne Doughty, DO   10 months ago Pre-diabetes   [place], Devonne Doughty, DO   1 year ago [place], Devonne Doughty, DO   1 year ago [place], Devonne Doughty, DO   1 year ago [place], Devonne Doughty, DO      Future Appointments            In 1 month [place], [facility], [facility]

## 2020-07-07 ENCOUNTER — Other Ambulatory Visit: Payer: Self-pay | Admitting: Family Medicine

## 2020-07-07 DIAGNOSIS — I10 Essential (primary) hypertension: Secondary | ICD-10-CM

## 2020-07-07 DIAGNOSIS — I2510 Atherosclerotic heart disease of native coronary artery without angina pectoris: Secondary | ICD-10-CM

## 2020-07-07 NOTE — Telephone Encounter (Signed)
Requested Prescriptions  Pending Prescriptions Disp Refills  . carvedilol (COREG) 3.125 MG tablet [Pharmacy Med Name: CARVEDILOL TABS 3.125MG ] 180 tablet 0    Sig: TAKE 1 TABLET TWICE A DAY WITH MEALS     Cardiovascular:  Beta Blockers Failed - 07/07/2020  3:17 AM      Failed - Valid encounter within last 6 months    Recent Outpatient Visits          6 months ago Pre-diabetes   [facility] Olin Hauser, DO   10 months ago Pre-diabetes   [place], Devonne Doughty, DO   1 year ago [place], Devonne Doughty, DO   1 year ago [place], [place], DO   1 year ago [place], DO      Future Appointments            In 3 weeks [place], Devonne Doughty, DO [facility], [place] BP in normal range    BP Readings from Last 1 Encounters:  04/19/20 134/83         Passed - Last Heart Rate in normal range    Pulse Readings from Last 1 Encounters:  04/19/20 64         courtesy refill

## 2020-07-17 DIAGNOSIS — I10 Essential (primary) hypertension: Secondary | ICD-10-CM | POA: Diagnosis not present

## 2020-07-17 DIAGNOSIS — I2510 Atherosclerotic heart disease of native coronary artery without angina pectoris: Secondary | ICD-10-CM | POA: Diagnosis not present

## 2020-07-17 DIAGNOSIS — E782 Mixed hyperlipidemia: Secondary | ICD-10-CM | POA: Diagnosis not present

## 2020-07-17 DIAGNOSIS — E119 Type 2 diabetes mellitus without complications: Secondary | ICD-10-CM | POA: Diagnosis not present

## 2020-07-20 ENCOUNTER — Other Ambulatory Visit: Payer: Self-pay | Admitting: *Deleted

## 2020-07-20 DIAGNOSIS — I10 Essential (primary) hypertension: Secondary | ICD-10-CM

## 2020-07-20 DIAGNOSIS — Z862 Personal history of diseases of the blood and blood-forming organs and certain disorders involving the immune mechanism: Secondary | ICD-10-CM

## 2020-07-20 DIAGNOSIS — R7303 Prediabetes: Secondary | ICD-10-CM

## 2020-07-20 DIAGNOSIS — E782 Mixed hyperlipidemia: Secondary | ICD-10-CM

## 2020-07-20 DIAGNOSIS — R351 Nocturia: Secondary | ICD-10-CM

## 2020-07-23 ENCOUNTER — Other Ambulatory Visit: Payer: Self-pay | Admitting: Family Medicine

## 2020-07-23 DIAGNOSIS — I10 Essential (primary) hypertension: Secondary | ICD-10-CM

## 2020-07-24 ENCOUNTER — Other Ambulatory Visit: Payer: Medicare Other

## 2020-07-24 DIAGNOSIS — I10 Essential (primary) hypertension: Secondary | ICD-10-CM | POA: Diagnosis not present

## 2020-07-24 DIAGNOSIS — Z862 Personal history of diseases of the blood and blood-forming organs and certain disorders involving the immune mechanism: Secondary | ICD-10-CM | POA: Diagnosis not present

## 2020-07-24 DIAGNOSIS — R351 Nocturia: Secondary | ICD-10-CM | POA: Diagnosis not present

## 2020-07-24 DIAGNOSIS — E782 Mixed hyperlipidemia: Secondary | ICD-10-CM | POA: Diagnosis not present

## 2020-07-24 DIAGNOSIS — R7303 Prediabetes: Secondary | ICD-10-CM | POA: Diagnosis not present

## 2020-07-25 LAB — LIPID PANEL
Cholesterol: 171 mg/dL (ref <200)
HDL: 46 mg/dL (ref >=40)
LDL Cholesterol (Calc): 104 mg/dL (calc) — ABNORMAL HIGH
Non-HDL Cholesterol (Calc): 125 mg/dL (calc) (ref <130)
Total CHOL/HDL Ratio: 3.7 (calc) (ref <5.0)
Triglycerides: 111 mg/dL (ref <150)

## 2020-07-25 LAB — CBC WITH DIFFERENTIAL/PLATELET
Absolute Monocytes: 578 cells/uL (ref 200–950)
Basophils Absolute: 80 cells/uL (ref 0–200)
Basophils Relative: 1.7 %
Eosinophils Absolute: 197 cells/uL (ref 15–500)
Eosinophils Relative: 4.2 %
HCT: 44.2 % (ref 38.5–50.0)
Hemoglobin: 14.6 g/dL (ref 13.2–17.1)
Lymphs Abs: 1095 cells/uL (ref 850–3900)
MCH: 31.1 pg (ref 27.0–33.0)
MCHC: 33 g/dL (ref 32.0–36.0)
MCV: 94 fL (ref 80.0–100.0)
MPV: 11.2 fL (ref 7.5–12.5)
Monocytes Relative: 12.3 %
Neutro Abs: 2750 cells/uL (ref 1500–7800)
Neutrophils Relative %: 58.5 %
Platelets: 156 10*3/uL (ref 140–400)
RBC: 4.7 10*6/uL (ref 4.20–5.80)
RDW: 12.7 % (ref 11.0–15.0)
Total Lymphocyte: 23.3 %
WBC: 4.7 10*3/uL (ref 3.8–10.8)

## 2020-07-25 LAB — COMPLETE METABOLIC PANEL WITH GFR
AG Ratio: 1.9 (calc) (ref 1.0–2.5)
ALT: 16 U/L (ref 9–46)
AST: 26 U/L (ref 10–35)
Albumin: 4.6 g/dL (ref 3.6–5.1)
Alkaline phosphatase (APISO): 58 U/L (ref 35–144)
BUN: 19 mg/dL (ref 7–25)
CO2: 26 mmol/L (ref 20–32)
Calcium: 9.8 mg/dL (ref 8.6–10.3)
Chloride: 102 mmol/L (ref 98–110)
Creat: 1.11 mg/dL (ref 0.70–1.18)
GFR, Est African American: 75 mL/min/{1.73_m2} (ref >=60)
GFR, Est Non African American: 65 mL/min/{1.73_m2} (ref >=60)
Globulin: 2.4 g/dL (calc) (ref 1.9–3.7)
Glucose, Bld: 103 mg/dL — ABNORMAL HIGH (ref 65–99)
Potassium: 4.4 mmol/L (ref 3.5–5.3)
Sodium: 139 mmol/L (ref 135–146)
Total Bilirubin: 0.6 mg/dL (ref 0.2–1.2)
Total Protein: 7 g/dL (ref 6.1–8.1)

## 2020-07-25 LAB — PSA: PSA: 2.5 ng/mL (ref <=4.0)

## 2020-07-25 LAB — HEMOGLOBIN A1C
Hgb A1c MFr Bld: 6.2 % of total Hgb — ABNORMAL HIGH (ref <5.7)
Mean Plasma Glucose: 131 (calc)
eAG (mmol/L): 7.3 (calc)

## 2020-07-28 ENCOUNTER — Other Ambulatory Visit: Payer: Self-pay | Admitting: Family Medicine

## 2020-07-28 DIAGNOSIS — R7303 Prediabetes: Secondary | ICD-10-CM

## 2020-07-30 ENCOUNTER — Other Ambulatory Visit: Payer: Self-pay | Admitting: Family Medicine

## 2020-07-30 ENCOUNTER — Other Ambulatory Visit: Payer: Self-pay

## 2020-07-30 ENCOUNTER — Encounter: Payer: Self-pay | Admitting: Family Medicine

## 2020-07-30 ENCOUNTER — Ambulatory Visit (INDEPENDENT_AMBULATORY_CARE_PROVIDER_SITE_OTHER): Payer: Medicare Other | Admitting: Family Medicine

## 2020-07-30 VITALS — BP 107/63 | HR 70 | Temp 97.3°F | Resp 16 | Ht 70.0 in | Wt 199.0 lb

## 2020-07-30 DIAGNOSIS — I700 Atherosclerosis of aorta: Secondary | ICD-10-CM

## 2020-07-30 DIAGNOSIS — E782 Mixed hyperlipidemia: Secondary | ICD-10-CM

## 2020-07-30 DIAGNOSIS — C8599 Non-Hodgkin lymphoma, unspecified, extranodal and solid organ sites: Secondary | ICD-10-CM | POA: Diagnosis not present

## 2020-07-30 DIAGNOSIS — I2510 Atherosclerotic heart disease of native coronary artery without angina pectoris: Secondary | ICD-10-CM | POA: Diagnosis not present

## 2020-07-30 DIAGNOSIS — R7303 Prediabetes: Secondary | ICD-10-CM | POA: Diagnosis not present

## 2020-07-30 DIAGNOSIS — I10 Essential (primary) hypertension: Secondary | ICD-10-CM

## 2020-07-30 MED ORDER — ROSUVASTATIN CALCIUM 5 MG PO TABS: 5.0000 mg | ORAL_TABLET | ORAL | 3 refills | Status: DC

## 2020-07-30 MED ORDER — EZETIMIBE 10 MG PO TABS: 10.0000 mg | ORAL_TABLET | Freq: Every day | ORAL | 3 refills | Status: DC

## 2020-07-30 MED ORDER — LISINOPRIL 2.5 MG PO TABS: 2.5000 mg | ORAL_TABLET | Freq: Every day | ORAL | 3 refills | Status: DC

## 2020-07-30 MED ORDER — CARVEDILOL 3.125 MG PO TABS: 3.1250 mg | ORAL_TABLET | Freq: Two times a day (BID) | ORAL | 3 refills | Status: DC

## 2020-07-30 MED ORDER — METFORMIN HCL ER 500 MG PO TB24: 500.0000 mg | ORAL_TABLET | Freq: Every day | ORAL | 3 refills | Status: DC

## 2020-07-30 NOTE — Assessment & Plan Note (Signed)
Stable currently in remission, without evidence of recurrence Followed by [facility] Dr Rogue Bussing, s/p Chemotherapy Last PET done 2019 negative  Resolved thrombocytopenia / anemia

## 2020-07-30 NOTE — Assessment & Plan Note (Signed)
Controlled cholesterol on intermittent statin / zetia and lifestyle On lower statin to weekly now (not TIW) Last lipid panel 07/2020 With CAD  Plan: 1. Gradually increase statin - Rosuvastatin from 5mg  weekly up to x 2 = 10mg  WEEKLY OR can do 5mg  twice a week - notify us when ready for new rx if trial is successful - Check Lipid again in 6 months  - Continue Zetia 10mg  2. Continue ASA 81mg  for secondary ASCVD risk reduction 3. Encourage improved lifestyle - low carb/cholesterol, reduce portion size, continue improving regular exercise

## 2020-07-30 NOTE — Assessment & Plan Note (Signed)
Controlled HTN on low dose meds  Complicated by CAD    Plan:  1. Continue current BP regimen Coreg 3.125mg  BID, Lisinopril 2.5mg  2. Encourage improved lifestyle - low sodium diet, regular exercise 3. Continue monitor BP outside office, bring readings to next visit, if persistently >140/90 or new symptoms notify office sooner

## 2020-07-30 NOTE — Patient Instructions (Addendum)
Thank you for coming to the office today.  Please schedule and return for a NURSE ONLY VISIT for VACCINE - Approximately 1-2 months - Need High Dose Flu Vaccine  Adjust Rosuvastatin dose from 1 tab to 2 tabs at ONCE week, (essentially double dose) OR you can do 1 pill TWICE a week   DUE for FASTING BLOOD WORK (no food or drink after midnight before the lab appointment, only water or coffee without cream/sugar on the morning of)  SCHEDULE "Lab Only" visit in the morning at the clinic for lab draw in 6 MONTHS   - Make sure Lab Only appointment is at about 1 week before your next appointment, so that results will be available  For Lab Results, once available within 2-3 days of blood draw, you can can log in to MyChart online to view your results and a brief explanation. Also, we can discuss results at next follow-up visit.    Please schedule a Follow-up Appointment to: Return in about 6 months (around 01/27/2021) for 6 month fasting lab only then 1 week later - Followup PreDM and Lipid results.  If you have any other questions or concerns, please feel free to call the office or send a message through [facility]. You may also schedule an earlier appointment if necessary.  Additionally, you may be receiving a survey about your experience at our office within a few days to 1 week by e-mail or mail. We value your feedback.  [name], DO [place]

## 2020-07-30 NOTE — Assessment & Plan Note (Signed)
Secondary vascular complication of Hyperlipidemia among other risk factors Known CAD On ASA ,Statin 

## 2020-07-30 NOTE — Assessment & Plan Note (Signed)
Significantly improved A1c down to 6.2 from prior range >6.4 Limited lifestyle currently Concern with HTN, HLD, CAD  Plan:  1. Take Metformin XR 500mg  daily 2. Encourage improved lifestyle - keto / low carb diet, keep improving exercise  F/u 6 months A1c trend

## 2020-07-30 NOTE — Progress Notes (Signed)
Subjective:    Patient ID: Corey Daniel, male    DOB: 09/10/1946, 74 y.o.   MRN: [id number]  Corey Daniel is a 74 y.o. male presenting on 07/30/2020 for Hypertension and Hyperlipidemia   HPI   Pre-Diabetes / Overweight BMI >28 Prior elevated A1c in past. Now A1c improved to 6.2 He is improved diet CBGs: Not checking CBG Meds:MetforminXR500mg daily Currently on ACEi Lifestyle: Weight down 14 lbs in 7 months - Diet (improved diet overall - on Atkins diet) - Exercise (regular walking exercise Denies hypoglycemia, polyuria, visual changes, numbness or tingling.  CHRONIC HTN: Reportsno concerns, no recent additional BP readings. Current Meds -Lisinopril 2.5mg  daily, Carvedilol 3.125mg  BID Reports good compliance, took meds today. Tolerating well, w/o complaints. Denies CP, dyspnea, HA, edema, dizziness / lightheadedness  HYPERLIPIDEMIA CAD, s/p CABG x 5 in 1997 Aortic Atherosclerosis Followed by Dr Ubaldo Glassing [facility], he has done stress test nuclear yearly Most recent stress test on 07/11/19 revealed good exercise tolerance with a fixed inferoapical defect with no evidence of reversible ischemia. LV systolic function was normal at 56%. - Last lipid9/2021 LDL up to 104, total chol up to 180 - Currently takingRosuvastatin WEEKLY 5mg  dose and Zetia daily. - Previously has myalgia on daily or 3 times weekly dosing statin in past. - on ASA 81mg  daily  PMH -LymphomaGastric (Diffuse Large B-Cell Lymphoma) / Thrombocytopenia Followed by Dr Rogue Bussing in 04/2020 and Dr Baruch Gouty  Health Maintenance:  Due for Flu Shot, return later in season.  Prostate Cancer Screening: PSA trend in past 1-3 range, now result is 2.5 (2021). Asymptomatic.  COVID19 vaccine not up date.  Colon CA Screening - Last procedure Colonoscopy 06/23/17 with [facility] (Endoscopy) Dr Carol Ada, multiple polyps, next return was within 3 years for repeat colonoscopy. He is anticipating repeat  Endoscopy upper as well, he has history of gastric cancer, see above.   Depression screen [facility] 2/9 12/19/2019 11/29/2019 05/16/2019  Decreased Interest 0 0 0  Down, Depressed, Hopeless 0 0 0  PHQ - 2 Score 0 0 0    Past Medical History:  Diagnosis Date  . Arthritis   . ASCVD (arteriosclerotic cardiovascular disease)   . Back pain    with radiation  . Cancer ([place])    lymphoma  . Coronary artery disease   . Hyperlipidemia   . Hypertension   . IDA (iron deficiency anemia)   . Lower leg pain   . Myocardial infarction ([place])   . Prediabetes    Past Surgical History:  Procedure Laterality Date  . AUTOGRAFT STRUCTURAL ICBG OBTAINED SEPARATE INCISION FOR SPINE SURGERY   Bilateral 07/15/2013  . BACK SURGERY    . CATARACT EXTRACTION  2010  . COLONOSCOPY  06/2017  . CORONARY ARTERY BYPASS GRAFT    . IR IMAGING GUIDED PORT INSERTION  07/31/2017  . IR REMOVAL TUN ACCESS W/ PORT W/O FL MOD SED  02/12/2018  . LAMINECTOMY LUMBAR EXCISON/EVACUATION EXTRADURAL INTRASPINAL LESION Bilateral 07/15/2013     . LAMINOTOMY REEXPLORATION POSTERIOR LUMBAR W/NERVE DECOMP & DISCECTOMY Bilateral 07/15/2013     . UPPER GI ENDOSCOPY  06/2017   Social History   Socioeconomic History  . Marital status: Married    Spouse name: Not on file  . Number of children: 1  . Years of education: Not on file  . Highest education level: High school graduate  Occupational History  . Occupation: retired  Tobacco Use  . Smoking status: Former Smoker    Packs/day: 0.50  Years: 30.00    Pack years: 15.00    Types: Cigarettes    Quit date: 2    Years since quitting: 38.7  . Smokeless tobacco: Never Used  Vaping Use  . Vaping Use: Never used  Substance and Sexual Activity  . Alcohol use: No  . Drug use: No  . Sexual activity: Yes  Other Topics Concern  . Not on file  Social History Narrative  . Not on file   Social Determinants of Health   Financial Resource Strain:   . Difficulty of Paying Living  Expenses: Not on file  Food Insecurity:   . Worried About Charity fundraiser in the Last Year: Not on file  . Ran Out of Food in the Last Year: Not on file  Transportation Needs:   . Lack of Transportation (Medical): Not on file  . Lack of Transportation (Non-Medical): Not on file  Physical Activity:   . Days of Exercise per Week: Not on file  . Minutes of Exercise per Session: Not on file  Stress:   . Feeling of Stress : Not on file  Social Connections:   . Frequency of Communication with Friends and Family: Not on file  . Frequency of Social Gatherings with Friends and Family: Not on file  . Attends Religious Services: Not on file  . Active Member of Clubs or Organizations: Not on file  . Attends Archivist Meetings: Not on file  . Marital Status: Not on file  Intimate Partner Violence:   . Fear of Current or Ex-Partner: Not on file  . Emotionally Abused: Not on file  . Physically Abused: Not on file  . Sexually Abused: Not on file   Family History  Problem Relation Age of Onset  . Heart disease Mother   . Coarctation of the aorta Brother    Current Outpatient Medications on File Prior to Visit  Medication Sig  . aspirin 81 MG chewable tablet Chew 81 mg by mouth daily.   . Cholecalciferol (VITAMIN D3) 2000 UNITS capsule Take 2,000 Units by mouth daily.   . Coenzyme Q10 100 MG capsule Take by mouth.  . Fish Oil-Cholecalciferol (FISH OIL + D3) 1000-1000 MG-UNIT CAPS Take by mouth.   No current facility-administered medications on file prior to visit.    Review of Systems  Constitutional: Negative for activity change, appetite change, chills, diaphoresis, fatigue and fever.  HENT: Negative for congestion and hearing loss.   Eyes: Negative for visual disturbance.  Respiratory: Negative for apnea, cough, chest tightness, shortness of breath and wheezing.   Cardiovascular: Negative for chest pain, palpitations and leg swelling.  Gastrointestinal: Negative for  abdominal pain, anal bleeding, blood in stool, constipation, diarrhea, nausea and vomiting.  Endocrine: Negative for cold intolerance and polyuria.  Genitourinary: Negative for difficulty urinating, dysuria, frequency and hematuria.  Musculoskeletal: Negative for arthralgias, back pain and neck pain.  Skin: Negative for rash.  Allergic/Immunologic: Negative for environmental allergies.  Neurological: Negative for dizziness, weakness, light-headedness, numbness and headaches.  Hematological: Negative for adenopathy.  Psychiatric/Behavioral: Negative for behavioral problems, dysphoric mood and sleep disturbance. The patient is not nervous/anxious.    Per HPI unless specifically indicated above     Objective:    BP 107/63   Pulse 70   Temp (!) 97.3 F (36.3 C) (Temporal)   Resp 16   Ht 5\' 10"  (1.778 m)   Wt 199 lb (90.3 kg)   SpO2 96%   BMI 28.55 kg/m  Wt Readings from Last 3 Encounters:  07/30/20 199 lb (90.3 kg)  04/19/20 206 lb 6.4 oz (93.6 kg)  12/19/19 213 lb 9.6 oz (96.9 kg)    Physical Exam Vitals and nursing note reviewed.  Constitutional:      General: He is not in acute distress.    Appearance: He is well-developed. He is not diaphoretic.     Comments: Well-appearing, comfortable, cooperative  HENT:     Head: Normocephalic and atraumatic.  Eyes:     General:        Right eye: No discharge.        Left eye: No discharge.     Conjunctiva/sclera: Conjunctivae normal.     Pupils: Pupils are equal, round, and reactive to light.  Neck:     Thyroid: No thyromegaly.     Vascular: No carotid bruit.  Cardiovascular:     Rate and Rhythm: Normal rate and regular rhythm.     Heart sounds: Normal heart sounds. No murmur heard.   Pulmonary:     Effort: Pulmonary effort is normal. No respiratory distress.     Breath sounds: Normal breath sounds. No wheezing or rales.  Abdominal:     General: Bowel sounds are normal. There is no distension.     Palpations: Abdomen is  soft. There is no mass.     Tenderness: There is no abdominal tenderness.  Musculoskeletal:        General: No tenderness. Normal range of motion.     Cervical back: Normal range of motion and neck supple.     Right lower leg: No edema.     Left lower leg: No edema.     Comments: Upper / Lower Extremities: - Normal muscle tone, strength bilateral upper extremities 5/5, lower extremities 5/5  Lymphadenopathy:     Cervical: No cervical adenopathy.  Skin:    General: Skin is warm and dry.     Findings: No erythema or rash.  Neurological:     Mental Status: He is alert and oriented to person, place, and time.     Comments: Distal sensation intact to light touch all extremities  Psychiatric:        Behavior: Behavior normal.     Comments: Well groomed, good eye contact, normal speech and thoughts        Results for orders placed or performed in visit on 07/20/20  PSA  Result Value Ref Range   PSA 2.5 < OR = 4.0 ng/mL  Lipid panel  Result Value Ref Range   Cholesterol 171 <200 mg/dL   HDL 46 > OR = 40 mg/dL   Triglycerides 111 <150 mg/dL   LDL Cholesterol (Calc) 104 (H) mg/dL (calc)   Total CHOL/HDL Ratio 3.7 <5.0 (calc)   Non-HDL Cholesterol (Calc) 125 <130 mg/dL (calc)  COMPLETE METABOLIC PANEL WITH GFR  Result Value Ref Range   Glucose, Bld 103 (H) 65 - 99 mg/dL   BUN 19 7 - 25 mg/dL   Creat 1.11 0.70 - 1.18 mg/dL   GFR, Est Non African American 65 > OR = 60 mL/min/1.70m2   GFR, Est African American 75 > OR = 60 mL/min/1.28m2   BUN/Creatinine Ratio NOT APPLICABLE 6 - 22 (calc)   Sodium 139 135 - 146 mmol/L   Potassium 4.4 3.5 - 5.3 mmol/L   Chloride 102 98 - 110 mmol/L   CO2 26 20 - 32 mmol/L   Calcium 9.8 8.6 - 10.3 mg/dL   Total Protein 7.0 6.1 -  8.1 g/dL   Albumin 4.6 3.6 - 5.1 g/dL   Globulin 2.4 1.9 - 3.7 g/dL (calc)   AG Ratio 1.9 1.0 - 2.5 (calc)   Total Bilirubin 0.6 0.2 - 1.2 mg/dL   Alkaline phosphatase (APISO) 58 35 - 144 U/L   AST 26 10 - 35 U/L   ALT  16 9 - 46 U/L  CBC with Differential/Platelet  Result Value Ref Range   WBC 4.7 3.8 - 10.8 Thousand/uL   RBC 4.70 4.20 - 5.80 Million/uL   Hemoglobin 14.6 13.2 - 17.1 g/dL   HCT 44.2 38 - 50 %   MCV 94.0 80.0 - 100.0 fL   MCH 31.1 27.0 - 33.0 pg   MCHC 33.0 32.0 - 36.0 g/dL   RDW 12.7 11.0 - 15.0 %   Platelets 156 140 - 400 Thousand/uL   MPV 11.2 7.5 - 12.5 fL   Neutro Abs 2,750 1,500 - 7,800 cells/uL   Lymphs Abs 1,095 850 - 3,900 cells/uL   Absolute Monocytes 578 200 - 950 cells/uL   Eosinophils Absolute 197 15 - 500 cells/uL   Basophils Absolute 80 0 - 200 cells/uL   Neutrophils Relative % 58.5 %   Total Lymphocyte 23.3 %   Monocytes Relative 12.3 %   Eosinophils Relative 4.2 %   Basophils Relative 1.7 %  Hemoglobin A1c  Result Value Ref Range   Hgb A1c MFr Bld 6.2 (H) <5.7 % of total Hgb   Mean Plasma Glucose 131 (calc)   eAG (mmol/L) 7.3 (calc)      Assessment & Plan:   Problem List Items Addressed This Visit    Pre-diabetes    Significantly improved A1c down to 6.2 from prior range >6.4 Limited lifestyle currently Concern with HTN, HLD, CAD  Plan:  1. Take Metformin XR 500mg  daily 2. Encourage improved lifestyle - keto / low carb diet, keep improving exercise  F/u 6 months A1c trend      Relevant Medications   metFORMIN (GLUCOPHAGE-XR) 500 MG 24 hr tablet   Mixed hyperlipidemia    Controlled cholesterol on intermittent statin / zetia and lifestyle On lower statin to weekly now (not TIW) Last lipid panel 07/2020 With CAD  Plan: 1. Gradually increase statin - Rosuvastatin from 5mg  weekly up to x 2 = 10mg  WEEKLY OR can do 5mg  twice a week - notify us when ready for new rx if trial is successful - Check Lipid again in 6 months  - Continue Zetia 10mg  2. Continue ASA 81mg  for secondary ASCVD risk reduction 3. Encourage improved lifestyle - low carb/cholesterol, reduce portion size, continue improving regular exercise      Relevant Medications   carvedilol  (COREG) 3.125 MG tablet   ezetimibe (ZETIA) 10 MG tablet   lisinopril (ZESTRIL) 2.5 MG tablet   rosuvastatin (CRESTOR) 5 MG tablet   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HCC) - Primary    Stable currently in remission, without evidence of recurrence Followed [facility] Dr Rogue Bussing, s/p Chemotherapy Last PET done 2019 negative  Resolved thrombocytopenia / anemia      Essential hypertension    Controlled HTN on low dose meds  Complicated by CAD    Plan:  1. Continue current BP regimen Coreg 3.125mg  BID, Lisinopril 2.5mg  2. Encourage improved lifestyle - low sodium diet, regular exercise 3. Continue monitor BP outside office, bring readings to next visit, if persistently >140/90 or new symptoms notify office sooner      Relevant Medications   carvedilol (COREG) 3.125 MG  tablet   ezetimibe (ZETIA) 10 MG tablet   lisinopril (ZESTRIL) 2.5 MG tablet   rosuvastatin (CRESTOR) 5 MG tablet   Atherosclerosis of aorta (HCC)    Secondary vascular complication of Hyperlipidemia among other risk factors Known CAD On ASA ,Statin      Relevant Medications   carvedilol (COREG) 3.125 MG tablet   ezetimibe (ZETIA) 10 MG tablet   lisinopril (ZESTRIL) 2.5 MG tablet   rosuvastatin (CRESTOR) 5 MG tablet   Arteriosclerosis of coronary artery    Stable without anginal symptoms CAD s/p CABG 1997 Followed by Cardiology Dr Ubaldo Glassing On ASA, Rosuvastatin, Zetia      Relevant Medications   carvedilol (COREG) 3.125 MG tablet   ezetimibe (ZETIA) 10 MG tablet   lisinopril (ZESTRIL) 2.5 MG tablet   rosuvastatin (CRESTOR) 5 MG tablet      Updated Health Maintenance information Reviewed recent lab results with patient Encouraged improvement to lifestyle with diet and exercise - Goal of weight loss   Meds ordered this encounter  Medications  . carvedilol (COREG) 3.125 MG tablet    Sig: Take 1 tablet (3.125 mg total) by mouth 2 (two) times daily with a meal.    Dispense:  180 tablet    Refill:  3    Adding  refills on file, do not send if not needed, should have already been filled recently  . ezetimibe (ZETIA) 10 MG tablet    Sig: Take 1 tablet (10 mg total) by mouth at bedtime.    Dispense:  90 tablet    Refill:  3    Adding refills on file, do not send if not needed, should have already been filled recently  . lisinopril (ZESTRIL) 2.5 MG tablet    Sig: Take 1 tablet (2.5 mg total) by mouth daily.    Dispense:  90 tablet    Refill:  3    Adding refills on file, do not send if not needed, should have already been filled recently  . metFORMIN (GLUCOPHAGE-XR) 500 MG 24 hr tablet    Sig: Take 1 tablet (500 mg total) by mouth daily with breakfast.    Dispense:  90 tablet    Refill:  3    Adding refills on file, do not send if not needed, should have already been filled recently  . rosuvastatin (CRESTOR) 5 MG tablet    Sig: Take 1 tablet (5 mg total) by mouth once a week.    Dispense:  12 tablet    Refill:  3    Adding refills on file, do not send if not needed, should have already been filled recently      Follow up plan: Return in about 6 months (around 01/27/2021) for 6 month fasting lab only then 1 week later - Followup PreDM and Lipid results.   6 month labs A1c + Lipid , see if statin dosing is appropriate  [name], DO [facility] 07/30/2020, 8:40 AM

## 2020-07-30 NOTE — Assessment & Plan Note (Addendum)
Stable without anginal symptoms CAD s/p CABG 1997 Followed by Cardiology Dr Ubaldo Glassing On ASA, Rosuvastatin, Zetia

## 2020-08-01 ENCOUNTER — Ambulatory Visit
Admission: RE | Admit: 2020-08-01 | Discharge: 2020-08-01 | Disposition: A | Discharge status: Home / Self Care | Payer: Medicare Other | Source: Ambulatory Visit | Attending: Radiation Oncology | Admitting: Radiation Oncology

## 2020-08-01 ENCOUNTER — Other Ambulatory Visit: Payer: Self-pay

## 2020-08-01 ENCOUNTER — Encounter: Payer: Self-pay | Admitting: Radiation Oncology

## 2020-08-01 VITALS — BP 130/82 | HR 65 | Temp 96.4°F | Resp 16 | Wt 202.7 lb

## 2020-08-01 DIAGNOSIS — Z8572 Personal history of non-Hodgkin lymphomas: Secondary | ICD-10-CM | POA: Insufficient documentation

## 2020-08-01 DIAGNOSIS — C8599 Non-Hodgkin lymphoma, unspecified, extranodal and solid organ sites: Secondary | ICD-10-CM

## 2020-08-01 DIAGNOSIS — Z923 Personal history of irradiation: Secondary | ICD-10-CM | POA: Diagnosis not present

## 2020-08-01 NOTE — Progress Notes (Signed)
Radiation Oncology Follow up Note  Name: Corey Daniel   Date:   08/01/2020 MRN:  [id number] DOB: 05/12/1946    This 74 y.o. male presents to the clinic today for 2-1/2-year follow-up status post involved field radiation therapy for stage Ia diffuse large cell B-cell lymphoma of the stomach.  Stage Ia  REFERRING PROVIDER: [name] *  HPI: Patient is a 74 year old male now out to half years having completed involved field radiation therapy to his stomach for large cell B cell lymphoma.  He was status post R-CHOP x3 and then received involved field treatment..  Patient has had upper endoscopy in the past with no evidence of disease is scheduled for another 1 in the next 2 months.  He specifically denies dysphagia nausea or any abdominal pain or discomfort.  COMPLICATIONS OF TREATMENT: none  FOLLOW UP COMPLIANCE: keeps appointments   PHYSICAL EXAM:  BP 130/82 (BP Location: Left Arm, Patient Position: Sitting)   Pulse 65   Temp (!) 96.4 F (35.8 C) (Tympanic)   Resp 16   Wt 202 lb 11.2 oz (91.9 kg)   BMI 29.08 kg/m  Well-developed well-nourished patient in NAD. HEENT reveals PERLA, EOMI, discs not visualized.  Oral cavity is clear. No oral mucosal lesions are identified. Neck is clear without evidence of cervical or supraclavicular adenopathy. Lungs are clear to A&P. Cardiac examination is essentially unremarkable with regular rate and rhythm without murmur rub or thrill. Abdomen is benign with no organomegaly or masses noted. Motor sensory and DTR levels are equal and symmetric in the upper and lower extremities. Cranial nerves II through XII are grossly intact. Proprioception is intact. No peripheral adenopathy or edema is identified. No motor or sensory levels are noted. Crude visual fields are within normal range.  RADIOLOGY RESULTS: No current films to review previous chest x-rays showed no evidence of active pulmonary disease  PLAN: Present time patient is 2 and half  years out from involved field radiation therapy for diffuse large B-cell lymphoma of the stomach.  He is doing well with no evidence of disease at this time I am going to turn follow-up care over to medical oncology.  I be happy to reevaluate the patient anytime should further opinion be indicated from radiation oncology.  I would like to take this opportunity to thank you for allowing me to participate in the care of your patient.Noreene Filbert, MD

## 2020-08-06 DIAGNOSIS — E119 Type 2 diabetes mellitus without complications: Secondary | ICD-10-CM | POA: Diagnosis not present

## 2020-08-06 DIAGNOSIS — I10 Essential (primary) hypertension: Secondary | ICD-10-CM | POA: Diagnosis not present

## 2020-08-06 DIAGNOSIS — Z86010 Personal history of colonic polyps: Secondary | ICD-10-CM | POA: Diagnosis not present

## 2020-08-06 DIAGNOSIS — R1013 Epigastric pain: Secondary | ICD-10-CM | POA: Diagnosis not present

## 2020-08-09 DIAGNOSIS — Z20828 Contact with and (suspected) exposure to other viral communicable diseases: Secondary | ICD-10-CM | POA: Diagnosis not present

## 2020-08-28 DIAGNOSIS — K319 Disease of stomach and duodenum, unspecified: Secondary | ICD-10-CM | POA: Diagnosis not present

## 2020-08-28 DIAGNOSIS — K2970 Gastritis, unspecified, without bleeding: Secondary | ICD-10-CM | POA: Diagnosis not present

## 2020-08-28 DIAGNOSIS — K2950 Unspecified chronic gastritis without bleeding: Secondary | ICD-10-CM | POA: Diagnosis not present

## 2020-08-28 DIAGNOSIS — K635 Polyp of colon: Secondary | ICD-10-CM | POA: Diagnosis not present

## 2020-08-28 DIAGNOSIS — Z86010 Personal history of colonic polyps: Secondary | ICD-10-CM | POA: Diagnosis not present

## 2020-08-28 DIAGNOSIS — D124 Benign neoplasm of descending colon: Secondary | ICD-10-CM | POA: Diagnosis not present

## 2020-08-28 DIAGNOSIS — D123 Benign neoplasm of transverse colon: Secondary | ICD-10-CM | POA: Diagnosis not present

## 2020-08-28 LAB — HM COLONOSCOPY

## 2020-10-17 DIAGNOSIS — J019 Acute sinusitis, unspecified: Secondary | ICD-10-CM | POA: Diagnosis not present

## 2020-10-17 DIAGNOSIS — Z03818 Encounter for observation for suspected exposure to other biological agents ruled out: Secondary | ICD-10-CM | POA: Diagnosis not present

## 2020-10-18 ENCOUNTER — Inpatient Hospital Stay (HOSPITAL_BASED_OUTPATIENT_CLINIC_OR_DEPARTMENT_OTHER): Payer: Medicare Other | Admitting: Internal Medicine

## 2020-10-18 ENCOUNTER — Inpatient Hospital Stay: Payer: Medicare Other | Attending: Internal Medicine

## 2020-10-18 VITALS — BP 126/82 | HR 67 | Temp 98.5°F | Wt 206.6 lb

## 2020-10-18 DIAGNOSIS — Z79899 Other long term (current) drug therapy: Secondary | ICD-10-CM | POA: Diagnosis not present

## 2020-10-18 DIAGNOSIS — C8599 Non-Hodgkin lymphoma, unspecified, extranodal and solid organ sites: Secondary | ICD-10-CM | POA: Diagnosis not present

## 2020-10-18 DIAGNOSIS — M549 Dorsalgia, unspecified: Secondary | ICD-10-CM | POA: Diagnosis not present

## 2020-10-18 DIAGNOSIS — C8330 Diffuse large B-cell lymphoma, unspecified site: Secondary | ICD-10-CM | POA: Diagnosis not present

## 2020-10-18 DIAGNOSIS — Z7984 Long term (current) use of oral hypoglycemic drugs: Secondary | ICD-10-CM | POA: Insufficient documentation

## 2020-10-18 DIAGNOSIS — Z87891 Personal history of nicotine dependence: Secondary | ICD-10-CM | POA: Insufficient documentation

## 2020-10-18 DIAGNOSIS — I10 Essential (primary) hypertension: Secondary | ICD-10-CM | POA: Diagnosis not present

## 2020-10-18 DIAGNOSIS — Z8719 Personal history of other diseases of the digestive system: Secondary | ICD-10-CM | POA: Insufficient documentation

## 2020-10-18 DIAGNOSIS — D509 Iron deficiency anemia, unspecified: Secondary | ICD-10-CM | POA: Insufficient documentation

## 2020-10-18 DIAGNOSIS — I2510 Atherosclerotic heart disease of native coronary artery without angina pectoris: Secondary | ICD-10-CM | POA: Insufficient documentation

## 2020-10-18 DIAGNOSIS — M129 Arthropathy, unspecified: Secondary | ICD-10-CM | POA: Diagnosis not present

## 2020-10-18 DIAGNOSIS — R059 Cough, unspecified: Secondary | ICD-10-CM | POA: Insufficient documentation

## 2020-10-18 DIAGNOSIS — G8929 Other chronic pain: Secondary | ICD-10-CM | POA: Insufficient documentation

## 2020-10-18 DIAGNOSIS — Z7982 Long term (current) use of aspirin: Secondary | ICD-10-CM | POA: Diagnosis not present

## 2020-10-18 DIAGNOSIS — M545 Low back pain, unspecified: Secondary | ICD-10-CM | POA: Insufficient documentation

## 2020-10-18 DIAGNOSIS — Z7952 Long term (current) use of systemic steroids: Secondary | ICD-10-CM | POA: Diagnosis not present

## 2020-10-18 DIAGNOSIS — I252 Old myocardial infarction: Secondary | ICD-10-CM | POA: Diagnosis not present

## 2020-10-18 DIAGNOSIS — E785 Hyperlipidemia, unspecified: Secondary | ICD-10-CM | POA: Diagnosis not present

## 2020-10-18 LAB — COMPREHENSIVE METABOLIC PANEL
ALT: 19 U/L (ref 0–44)
AST: 20 U/L (ref 15–41)
Albumin: 4.2 g/dL (ref 3.5–5.0)
Alkaline Phosphatase: 54 U/L (ref 38–126)
Anion gap: 11 (ref 5–15)
BUN: 29 mg/dL — ABNORMAL HIGH (ref 8–23)
CO2: 23 mmol/L (ref 22–32)
Calcium: 9.1 mg/dL (ref 8.9–10.3)
Chloride: 102 mmol/L (ref 98–111)
Creatinine, Ser: 1.13 mg/dL (ref 0.61–1.24)
GFR, Estimated: >60 mL/min (ref >60)
Glucose, Bld: 107 mg/dL — ABNORMAL HIGH (ref 70–99)
Potassium: 3.8 mmol/L (ref 3.5–5.1)
Sodium: 136 mmol/L (ref 135–145)
Total Bilirubin: 0.8 mg/dL (ref 0.3–1.2)
Total Protein: 7.5 g/dL (ref 6.5–8.1)

## 2020-10-18 LAB — CBC WITH DIFFERENTIAL/PLATELET
Abs Immature Granulocytes: 0.04 10*3/uL (ref 0.00–0.07)
Basophils Absolute: 0.1 10*3/uL (ref 0.0–0.1)
Basophils Relative: 1 %
Eosinophils Absolute: 0.2 10*3/uL (ref 0.0–0.5)
Eosinophils Relative: 2 %
HCT: 41.9 % (ref 39.0–52.0)
Hemoglobin: 14.5 g/dL (ref 13.0–17.0)
Immature Granulocytes: 0 %
Lymphocytes Relative: 17 %
Lymphs Abs: 1.5 10*3/uL (ref 0.7–4.0)
MCH: 31.3 pg (ref 26.0–34.0)
MCHC: 34.6 g/dL (ref 30.0–36.0)
MCV: 90.5 fL (ref 80.0–100.0)
Monocytes Absolute: 1.1 10*3/uL — ABNORMAL HIGH (ref 0.1–1.0)
Monocytes Relative: 12 %
Neutro Abs: 6.1 10*3/uL (ref 1.7–7.7)
Neutrophils Relative %: 68 %
Platelets: 148 10*3/uL — ABNORMAL LOW (ref 150–400)
RBC: 4.63 MIL/uL (ref 4.22–5.81)
RDW: 12.9 % (ref 11.5–15.5)
WBC: 9 10*3/uL (ref 4.0–10.5)
nRBC: 0 % (ref 0.0–0.2)

## 2020-10-18 LAB — LACTATE DEHYDROGENASE: LDH: 128 U/L (ref 98–192)

## 2020-10-18 NOTE — Assessment & Plan Note (Addendum)
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  Status post chemotherapy followed by radiation; February 2019 PET scan CR. Clinically, no evidence of recurrence.  Continue surveillance every 6 months on a clinical basis without any imaging.  Recent endoscopy negative.  #Chronic back pain s/p back surgery- STABLE.    # Cough/ URI- COVID-rapid-NEG;awaiting RT-PCR [unvaccinated]; on amoxicllin; prednisone; declines vaccination.    #Vaccine counseling: I had a long discussion the patient regarding importance of vaccination in preventing/continuing risk of severe Covid infections.  However patient has severe/significant concerns regarding science behind the vaccines; their efficacy and in general the severity of Covid illness.   # DISPOSITION:  # follow up in 6 months- MD-labs-cbc/cmp/ldh; Dr.B

## 2020-10-18 NOTE — Progress Notes (Signed)
[facility] OFFICE PROGRESS NOTE  Patient Care Team: Olin Hauser, DO as PCP - General (Family Medicine)  Cancer Staging No matching staging information was found for the patient.   Oncology History Overview Note  # AUG 2018- DIFFUSE LARGE B CELL LYMPHOMA STOMACH-STAGE IE [BMBx-NEG]Fundus ;CD-20 pos;variable- bcl6/mum-?? GCB vs. ABC subtype;  [EGD for IDA; Dr.Hung; [place]]- NEG for FISH for;myc; bcl-6; bcl-2;11:14; MALT [8q21];NEG for CD-10; ki-67-70%; SEP PET- uptake in stomach. Hepatitis panel-NEG; BMBx-NEG  # SEP 25th 2018- R-CHOP x3 ; RT [until jan 3rd 2019]; PET Feb 2019- CR; s/p EGD- NEG [March 2019]; EGD/colo [10 polyps][2021-July]; EGD- in 2022; colo-2024.   # AUG 2018- H.pylori positive/stomach body.   # IDA-resolved  # CAD [s/p CABG; Dr.fath];MUGA scan [sep 2018]- 56.5%; port explanted.  ---------------------------------------------------------------   DIAGNOSIS: [ aug 2018]- DLBCL OD STOMACH  STAGE: IE ; GOALS: curative  CURRENT/MOST RECENT THERAPY;  surveillance    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[place])      INTERVAL HISTORY:  Corey Daniel 74 y.o.  male pleasant patient above history of diffuse large B-cell lymphoma of the stomach is here for follow-up.  Patient interim underwent endoscopy.  Negative for any recurrence.  There is no abdominal pain nausea vomiting.  Denies any blood in stools or black or stools.  More recently noted to have cough and phlegm.  He was recently seen in urgent care.  Covid rapid test negative.  Awaiting RT-PCR.  Is currently on prednisone and amoxicillin.  He has not had a chest x-ray.  Review of Systems  Constitutional: Negative for chills, diaphoresis, fever, malaise/fatigue and weight loss.  HENT: Negative for nosebleeds and sore throat.   Eyes: Negative for double vision.  Respiratory: Negative for cough, hemoptysis, sputum production, shortness of breath and wheezing.   Cardiovascular: Negative for chest pain,  palpitations, orthopnea and leg swelling.  Gastrointestinal: Negative for abdominal pain, blood in stool, constipation, diarrhea, heartburn, melena, nausea and vomiting.  Genitourinary: Negative for dysuria, frequency and urgency.  Musculoskeletal: Positive for back pain. Negative for joint pain.  Skin: Negative.  Negative for itching and rash.  Neurological: Negative for dizziness, tingling, focal weakness, weakness and headaches.  Endo/Heme/Allergies: Does not bruise/bleed easily.  Psychiatric/Behavioral: Negative for depression. The patient is not nervous/anxious and does not have insomnia.       PAST MEDICAL HISTORY :  Past Medical History:  Diagnosis Date  . Arthritis   . ASCVD (arteriosclerotic cardiovascular disease)   . Back pain    with radiation  . Cancer ([place])    lymphoma  . Coronary artery disease   . Hyperlipidemia   . Hypertension   . IDA (iron deficiency anemia)   . Lower leg pain   . Myocardial infarction ([place])   . Prediabetes     PAST SURGICAL HISTORY :   Past Surgical History:  Procedure Laterality Date  . AUTOGRAFT STRUCTURAL ICBG OBTAINED SEPARATE INCISION FOR SPINE SURGERY   Bilateral 07/15/2013  . BACK SURGERY    . CATARACT EXTRACTION  2010  . COLONOSCOPY  06/2017  . CORONARY ARTERY BYPASS GRAFT    . IR IMAGING GUIDED PORT INSERTION  07/31/2017  . IR REMOVAL TUN ACCESS W/ PORT W/O FL MOD SED  02/12/2018  . LAMINECTOMY LUMBAR EXCISON/EVACUATION EXTRADURAL INTRASPINAL LESION Bilateral 07/15/2013     . LAMINOTOMY REEXPLORATION POSTERIOR LUMBAR W/NERVE DECOMP & DISCECTOMY Bilateral 07/15/2013     . UPPER GI ENDOSCOPY  06/2017    FAMILY HISTORY :   Family History  Problem Relation Age of Onset  . Heart disease Mother   . Coarctation of the aorta Brother     SOCIAL HISTORY:   Social History   Tobacco Use  . Smoking status: Former Smoker    Packs/day: 0.50    Years: 30.00    Pack years: 15.00    Types: Cigarettes    Quit date: 1983    Years since  quitting: 38.9  . Smokeless tobacco: Never Used  Vaping Use  . Vaping Use: Never used  Substance Use Topics  . Alcohol use: No  . Drug use: No    ALLERGIES:  is allergic to tramadol.  MEDICATIONS:  Current Outpatient Medications  Medication Sig Dispense Refill  . amoxicillin-clavulanate (AUGMENTIN) 875-125 MG tablet Take by mouth.    Marland Kitchen aspirin 81 MG chewable tablet Chew 81 mg by mouth daily.     . benzonatate (TESSALON) 200 MG capsule Take by mouth.    . carvedilol (COREG) 3.125 MG tablet Take 1 tablet (3.125 mg total) by mouth 2 (two) times daily with a meal. 180 tablet 3  . Cholecalciferol (VITAMIN D3) 2000 UNITS capsule Take 2,000 Units by mouth daily.     . Coenzyme Q10 100 MG capsule Take by mouth.    . ezetimibe (ZETIA) 10 MG tablet Take 1 tablet (10 mg total) by mouth at bedtime. 90 tablet 3  . Fish Oil-Cholecalciferol (FISH OIL + D3) 1000-1000 MG-UNIT CAPS Take by mouth.    . fluticasone (FLONASE) 50 MCG/ACT nasal spray Place into the nose.    Marland Kitchen lisinopril (ZESTRIL) 2.5 MG tablet Take 1 tablet (2.5 mg total) by mouth daily. 90 tablet 3  . metFORMIN (GLUCOPHAGE-XR) 500 MG 24 hr tablet Take 1 tablet (500 mg total) by mouth daily with breakfast. 90 tablet 3  . predniSONE (DELTASONE) 20 MG tablet Take by mouth.    . rosuvastatin (CRESTOR) 5 MG tablet Take 1 tablet (5 mg total) by mouth once a week. (Patient taking differently: Take 5 mg by mouth once a week. 2 times a week) 12 tablet 3   No current facility-administered medications for this visit.    PHYSICAL EXAMINATION: ECOG PERFORMANCE STATUS: 0 - Asymptomatic  BP 126/82 (BP Location: Left Arm, Patient Position: Sitting)   Pulse 67   Temp 98.5 F (36.9 C) (Tympanic)   Wt 206 lb 9.6 oz (93.7 kg)   SpO2 95%   BMI 29.64 kg/m   Filed Weights   10/18/20 1101  Weight: 206 lb 9.6 oz (93.7 kg)   Physical Exam HENT:     Head: Normocephalic and atraumatic.     Mouth/Throat:     Pharynx: No oropharyngeal exudate.   Eyes:     Pupils: Pupils are equal, round, and reactive to light.  Cardiovascular:     Rate and Rhythm: Normal rate and regular rhythm.  Pulmonary:     Effort: No respiratory distress.     Breath sounds: No wheezing.  Abdominal:     General: Bowel sounds are normal. There is no distension.     Palpations: Abdomen is soft. There is no mass.     Tenderness: There is no abdominal tenderness. There is no guarding or rebound.  Musculoskeletal:        General: No tenderness. Normal range of motion.     Cervical back: Normal range of motion and neck supple.  Skin:    General: Skin is warm.  Neurological:     Mental Status: He is alert and oriented  to person, place, and time.  Psychiatric:        Mood and Affect: Affect normal.        LABORATORY DATA:  I have reviewed the data as listed    Component Value Date/Time   NA 136 10/18/2020 0954   NA 139 02/06/2016 0806   K 3.8 10/18/2020 0954   CL 102 10/18/2020 0954   CO2 23 10/18/2020 0954   GLUCOSE 107 (H) 10/18/2020 0954   BUN 29 (H) 10/18/2020 0954   BUN 18 02/06/2016 0806   CREATININE 1.13 10/18/2020 0954   CREATININE 1.11 07/24/2020 0759   CALCIUM 9.1 10/18/2020 0954   PROT 7.5 10/18/2020 0954   PROT 7.1 02/06/2016 0806   ALBUMIN 4.2 10/18/2020 0954   ALBUMIN 4.7 02/06/2016 0806   AST 20 10/18/2020 0954   ALT 19 10/18/2020 0954   ALKPHOS 54 10/18/2020 0954   BILITOT 0.8 10/18/2020 0954   BILITOT 0.5 02/06/2016 0806   GFRNONAA >60 10/18/2020 0954   GFRNONAA 65 07/24/2020 0759   GFRAA 75 07/24/2020 0759    No results found for: SPEP, UPEP  Lab Results  Component Value Date   WBC 9.0 10/18/2020   NEUTROABS 6.1 10/18/2020   HGB 14.5 10/18/2020   HCT 41.9 10/18/2020   MCV 90.5 10/18/2020   PLT 148 (L) 10/18/2020      Chemistry      Component Value Date/Time   NA 136 10/18/2020 0954   NA 139 02/06/2016 0806   K 3.8 10/18/2020 0954   CL 102 10/18/2020 0954   CO2 23 10/18/2020 0954   BUN 29 (H)  10/18/2020 0954   BUN 18 02/06/2016 0806   CREATININE 1.13 10/18/2020 0954   CREATININE 1.11 07/24/2020 0759      Component Value Date/Time   CALCIUM 9.1 10/18/2020 0954   ALKPHOS 54 10/18/2020 0954   AST 20 10/18/2020 0954   ALT 19 10/18/2020 0954   BILITOT 0.8 10/18/2020 0954   BILITOT 0.5 02/06/2016 0806       RADIOGRAPHIC STUDIES: I have personally reviewed the radiological images as listed and agreed with the findings in the report. No results found.   ASSESSMENT & PLAN:  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[place]) #Diffuse large B-cell lymphoma of the stomach; stage I E.  Status post chemotherapy followed by radiation; February 2019 PET scan CR. Clinically, no evidence of recurrence.  Continue surveillance every 6 months on a clinical basis without any imaging.  Recent endoscopy negative.  #Chronic back pain s/p back surgery- STABLE.    # Cough/ URI- COVID-rapid-NEG;awaiting RT-PCR [unvaccinated]; on amoxicllin; prednisone; declines vaccination.    # DISPOSITION:  # follow up in 6 months- MD-labs-cbc/cmp/ldh; Dr.B     Orders Placed This Encounter  Procedures  . CBC with Differential    Standing Status:   Future    Number of Occurrences:   1    Standing Expiration Date:   10/18/2021  . Comprehensive metabolic panel    Standing Status:   Future    Number of Occurrences:   1    Standing Expiration Date:   10/18/2021  . Lactate dehydrogenase    Standing Status:   Future    Number of Occurrences:   1    Standing Expiration Date:   10/18/2021  . CBC with Differential    Standing Status:   Future    Standing Expiration Date:   10/18/2021  . Comprehensive metabolic panel    Standing Status:   Future  Standing Expiration Date:   10/18/2021  . Lactate dehydrogenase    Standing Status:   Future    Standing Expiration Date:   10/18/2021   All questions were answered. The patient knows to call the clinic with any problems, questions or concerns.      Cammie Sickle, MD 10/21/2020  7:47 PM

## 2020-10-18 NOTE — Progress Notes (Signed)
Was seen yesterday morning at the [facility] and was diagnosed bronchitis and a cold. Was given an antibiotic and prednisone which he started yesterday.

## 2020-11-01 DIAGNOSIS — J019 Acute sinusitis, unspecified: Secondary | ICD-10-CM | POA: Diagnosis not present

## 2020-12-04 ENCOUNTER — Telehealth: Payer: Self-pay

## 2020-12-04 ENCOUNTER — Ambulatory Visit (INDEPENDENT_AMBULATORY_CARE_PROVIDER_SITE_OTHER): Payer: Medicare Other

## 2020-12-04 VITALS — Ht 71.0 in | Wt 208.5 lb

## 2020-12-04 DIAGNOSIS — Z0000 Encounter for general adult medical examination without abnormal findings: Secondary | ICD-10-CM

## 2020-12-04 NOTE — Telephone Encounter (Signed)
Patient consented to telehealth visit. 

## 2020-12-04 NOTE — Progress Notes (Signed)
I connected with Karie Chimera today by telephone and verified that I am speaking with the correct person using two identifiers. Location patient: home Location provider: work Persons participating in the virtual visit: Baron, Steinback LPN.   I discussed the limitations, risks, security and privacy concerns of performing an evaluation and management service by telephone and the availability of in person appointments. I also discussed with the patient that there may be a patient responsible charge related to this service. The patient expressed understanding and verbally consented to this telephonic visit.    Interactive audio and video telecommunications were attempted between this provider and patient, however failed, due to patient having technical difficulties OR patient did not have access to video capability.  We continued and completed visit with audio only.     Vital signs may be patient reported or missing.  Subjective:   Carston Behrns is a 75 y.o. male who presents for Medicare Annual/Subsequent preventive examination.  Review of Systems     Cardiac Risk Factors include: dyslipidemia;male gender;sedentary lifestyle     Objective:    Today's Vitals   12/04/20 1056  Weight: 208 lb 8 oz (94.6 kg)  Height: 5\' 11"  (1.803 m)   Body mass index is 29.08 kg/m.  Advanced Directives 12/04/2020 08/01/2020 08/01/2020 04/19/2020 11/29/2019 10/19/2019 07/28/2019  Does Patient Have a Medical Advance Directive? No Yes Yes Yes Yes Yes Yes  Type of Advance Directive - [place];Living will - [place];Living will Living will;Healthcare Power of [place];Living will [place];Living will  Does patient want to make changes to medical advance directive? - - - - - No - Patient declined -  Copy of [place] in Chart? - No - copy requested - - No - copy requested No - copy requested -   Would patient like information on creating a medical advance directive? - No - Patient declined - - - No - Patient declined -    Current Medications (verified) Outpatient Encounter Medications as of 12/04/2020  Medication Sig  . aspirin 81 MG chewable tablet Chew 81 mg by mouth daily.   . carvedilol (COREG) 3.125 MG tablet Take 1 tablet (3.125 mg total) by mouth 2 (two) times daily with a meal.  . Cholecalciferol (VITAMIN D3) 2000 UNITS capsule Take 2,000 Units by mouth daily.   . Coenzyme Q10 100 MG capsule Take by mouth.  . ezetimibe (ZETIA) 10 MG tablet Take 1 tablet (10 mg total) by mouth at bedtime.  . Fish Oil-Cholecalciferol (FISH OIL + D3) 1000-1000 MG-UNIT CAPS Take by mouth.  Marland Kitchen lisinopril (ZESTRIL) 2.5 MG tablet Take 1 tablet (2.5 mg total) by mouth daily.  . metFORMIN (GLUCOPHAGE-XR) 500 MG 24 hr tablet Take 1 tablet (500 mg total) by mouth daily with breakfast.  . rosuvastatin (CRESTOR) 5 MG tablet Take 1 tablet (5 mg total) by mouth once a week. (Patient taking differently: Take 5 mg by mouth once a week. 2 times a week)  . fluticasone (FLONASE) 50 MCG/ACT nasal spray Place into the nose. (Patient not taking: Reported on 12/04/2020)   No facility-administered encounter medications on file as of 12/04/2020.    Allergies (verified) Tramadol   History: Past Medical History:  Diagnosis Date  . Arthritis   . ASCVD (arteriosclerotic cardiovascular disease)   . Back pain    with radiation  . Cancer ([place])    lymphoma  . Coronary artery disease   .  Hyperlipidemia   . Hypertension   . IDA (iron deficiency anemia)   . Lower leg pain   . Myocardial infarction ([place])   . Prediabetes    Past Surgical History:  Procedure Laterality Date  . AUTOGRAFT STRUCTURAL ICBG OBTAINED SEPARATE INCISION FOR SPINE SURGERY   Bilateral 07/15/2013  . BACK SURGERY    . CATARACT EXTRACTION  2010  . COLONOSCOPY  06/2017  . CORONARY ARTERY BYPASS GRAFT    . IR IMAGING GUIDED PORT INSERTION   07/31/2017  . IR REMOVAL TUN ACCESS W/ PORT W/O FL MOD SED  02/12/2018  . LAMINECTOMY LUMBAR EXCISON/EVACUATION EXTRADURAL INTRASPINAL LESION Bilateral 07/15/2013     . LAMINOTOMY REEXPLORATION POSTERIOR LUMBAR W/NERVE DECOMP & DISCECTOMY Bilateral 07/15/2013     . UPPER GI ENDOSCOPY  06/2017   Family History  Problem Relation Age of Onset  . Heart disease Mother   . Coarctation of the aorta Brother    Social History   Socioeconomic History  . Marital status: Married    Spouse name: Not on file  . Number of children: 1  . Years of education: Not on file  . Highest education level: High school graduate  Occupational History  . Occupation: retired  Tobacco Use  . Smoking status: Former Smoker    Packs/day: 0.50    Years: 30.00    Pack years: 15.00    Types: Cigarettes    Quit date: 1983    Years since quitting: 39.0  . Smokeless tobacco: Never Used  Vaping Use  . Vaping Use: Never used  Substance and Sexual Activity  . Alcohol use: No  . Drug use: No  . Sexual activity: Yes  Other Topics Concern  . Not on file  Social History Narrative  . Not on file   Social Determinants of Health   Financial Resource Strain: Low Risk   . Difficulty of Paying Living Expenses: Not hard at all  Food Insecurity: No Food Insecurity  . Worried About Charity fundraiser in the Last Year: Never true  . Ran Out of Food in the Last Year: Never true  Transportation Needs: No Transportation Needs  . Lack of Transportation (Medical): No  . Lack of Transportation (Non-Medical): No  Physical Activity: Inactive  . Days of Exercise per Week: 0 days  . Minutes of Exercise per Session: 0 min  Stress: No Stress Concern Present  . Feeling of Stress : Not at all  Social Connections: Not on file    Tobacco Counseling Counseling given: Not Answered   Clinical Intake:  Pre-visit preparation completed: Yes  Pain : No/denies pain     Nutritional Status: BMI 25 -29 Overweight Nutritional  Risks: None Diabetes: No  How often do you need to have someone help you when you read instructions, pamphlets, or other written materials from your doctor or pharmacy?: 1 - Never What is the last grade level you completed in school?: 12th grade  Diabetic? no  Interpreter Needed?: No  Information entered by :: NAllen LPN   Activities of Daily Living In your present state of health, do you have any difficulty performing the following activities: 12/04/2020  Hearing? Y  Comment a little  Vision? N  Difficulty concentrating or making decisions? N  Walking or climbing stairs? N  Dressing or bathing? N  Doing errands, shopping? N  Preparing Food and eating ? N  Using the Toilet? N  In the past six months, have you accidently leaked urine? N  Do you have problems with loss of bowel control? N  Managing your Medications? N  Managing your Finances? N  Housekeeping or managing your Housekeeping? N  Some recent data might be hidden    Patient Care Team: Olin Hauser, DO as PCP - General (Family Medicine)  Indicate any recent Medical Services you may have received from other than [facility] providers in the past year (date may be approximate).     Assessment:   This is a routine wellness examination for Jonell.  Hearing/Vision screen  Hearing Screening   125Hz  250Hz  500Hz  1000Hz  2000Hz  3000Hz  4000Hz  6000Hz  8000Hz   Right ear:           Left ear:           Vision Screening Comments: No regular eye exams  Dietary issues and exercise activities discussed: Current Exercise Habits: The patient does not participate in regular exercise at present  Goals    . DIET - INCREASE WATER INTAKE     Recommend drinking at least 7-8 glasses of water a day     . Increase physical activity     Recommend increasing physical activity to 150 minutes per week     . Patient Stated     12/04/2020, wants to lose weight      Depression Screen PHQ 2/9 Scores 12/04/2020 12/19/2019 11/29/2019  05/16/2019 02/01/2019 11/24/2018 10/27/2018  PHQ - 2 Score 0 0 0 0 0 0 0    Fall Risk Fall Risk  12/04/2020 12/19/2019 11/29/2019 05/16/2019 02/01/2019  Falls in the past year? 0 0 0 0 0  Number falls in past yr: - 0 0 - -  Comment - - - - -  Injury with Fall? - 0 0 - -  Risk for fall due to : Medication side effect - - - -  Follow up Falls evaluation completed;Education provided;Falls prevention discussed Falls evaluation completed - Falls evaluation completed Falls evaluation completed    FALL RISK PREVENTION PERTAINING TO THE HOME:  Any stairs in or around the home? Yes  If so, are there any without handrails? No  Home free of loose throw rugs in walkways, pet beds, electrical cords, etc? Yes  Adequate lighting in your home to reduce risk of falls? Yes   ASSISTIVE DEVICES UTILIZED TO PREVENT FALLS:  Life alert? No  Use of a cane, walker or w/c? No  Grab bars in the bathroom? No  Shower chair or bench in shower? No  Elevated toilet seat or a handicapped toilet? No   TIMED UP AND GO:  Was the test performed? No .  Cognitive Function:     6CIT Screen 12/04/2020 11/24/2018 10/06/2017  What Year? 0 points 0 points 0 points  What month? 0 points 0 points 0 points  What time? 0 points 0 points 0 points  Count back from 20 0 points 0 points 0 points  Months in reverse 0 points 0 points 0 points  Repeat phrase 0 points 0 points 0 points  Total Score 0 0 0    Immunizations Immunization History  Administered Date(s) Administered  . Fluad Quad(high Dose 65+) 08/16/2019  . Hepatitis B 03/17/2004  . Influenza, High Dose Seasonal PF 08/08/2015, 08/07/2016, 07/22/2017, 10/27/2018  . Influenza-Unspecified 08/29/2013, 09/28/2014, 08/08/2015, 08/07/2016  . Pneumococcal Conjugate-13 09/28/2014  . Pneumococcal Polysaccharide-23 08/07/2016  . Tdap 04/17/2014    TDAP status: Up to date  Flu Vaccine status: Due, Education has been provided regarding the importance of this vaccine. Advised  may receive this vaccine at local pharmacy or Health Dept. Aware to provide a copy of the vaccination record if obtained from local pharmacy or Health Dept. Verbalized acceptance and understanding.  Pneumococcal vaccine status: Up to date  Covid-19 vaccine status: Declined, Education has been provided regarding the importance of this vaccine but patient still declined. Advised may receive this vaccine at local pharmacy or Health Dept.or vaccine clinic. Aware to provide a copy of the vaccination record if obtained from local pharmacy or Health Dept. Verbalized acceptance and understanding.  Qualifies for Shingles Vaccine? Yes   Zostavax completed No   Shingrix Completed?: No.    Education has been provided regarding the importance of this vaccine. Patient has been advised to call insurance company to determine out of pocket expense if they have not yet received this vaccine. Advised may also receive vaccine at local pharmacy or Health Dept. Verbalized acceptance and understanding.  Screening Tests Health Maintenance  Topic Date Due  . INFLUENZA VACCINE  06/17/2020  . COLONOSCOPY (Pts 45-62yrs Insurance coverage will need to be confirmed)  06/23/2020  . COVID-19 Vaccine (1) 12/20/2020 (Originally 11/11/1958)  . TETANUS/TDAP  04/17/2024  . Hepatitis C Screening  Completed  . PNA vac Low Risk Adult  Completed    Health Maintenance  Health Maintenance Due  Topic Date Due  . INFLUENZA VACCINE  06/17/2020  . COLONOSCOPY (Pts 45-57yrs Insurance coverage will need to be confirmed)  06/23/2020    Colorectal cancer screening: Type of screening: Colonoscopy. Completed 08/2020. Repeat every 3 years  Lung Cancer Screening: (Low Dose CT Chest recommended if Age 30-80 years, 30 pack-year currently smoking OR have quit w/in 15years.) does not qualify.   Lung Cancer Screening Referral: no  Additional Screening:  Hepatitis C Screening: does qualify; Completed 08/08/2015  Vision Screening:  Recommended annual ophthalmology exams for early detection of glaucoma and other disorders of the eye. Is the patient up to date with their annual eye exam?  No  Who is the provider or what is the name of the office in which the patient attends annual eye exams? none If pt is not established with a provider, would they like to be referred to a provider to establish care? No .   Dental Screening: Recommended annual dental exams for proper oral hygiene  Community Resource Referral / Chronic Care Management: CRR required this visit?  No   CCM required this visit?  No      Plan:     I have personally reviewed and noted the following in the patient's chart:   . Medical and social history . Use of alcohol, tobacco or illicit drugs  . Current medications and supplements . Functional ability and status . Nutritional status . Physical activity . Advanced directives . List of other physicians . Hospitalizations, surgeries, and ER visits in previous 12 months . Vitals . Screenings to include cognitive, depression, and falls . Referrals and appointments  In addition, I have reviewed and discussed with patient certain preventive protocols, quality metrics, and best practice recommendations. A written personalized care plan for preventive services as well as general preventive health recommendations were provided to patient.     Kellie Simmering, LPN   [id number]   Nurse Notes:

## 2020-12-04 NOTE — Patient Instructions (Signed)
Mr. Corey Daniel , Thank you for taking time to come for your Medicare Wellness Visit. I appreciate your ongoing commitment to your health goals. Please review the following plan we discussed and let me know if I can assist you in the future.   Screening recommendations/referrals: Colonoscopy: completed 08/2020 per patient Recommended yearly ophthalmology/optometry visit for glaucoma screening and checkup Recommended yearly dental visit for hygiene and checkup  Vaccinations: Influenza vaccine: due Pneumococcal vaccine: completed 08/07/2016 Tdap vaccine: completed 04/17/2014, due 04/17/2024 Shingles vaccine: discussed   Covid-19:  decline  Advanced directives: Advance directive discussed with you today.   Conditions/risks identified: none  Next appointment: Follow up in one year for your annual wellness visit.   Preventive Care 8 Years and Older, Male Preventive care refers to lifestyle choices and visits with your health care provider that can promote health and wellness. What does preventive care include?  A yearly physical exam. This is also called an annual well check.  Dental exams once or twice a year.  Routine eye exams. Ask your health care provider how often you should have your eyes checked.  Personal lifestyle choices, including:  Daily care of your teeth and gums.  Regular physical activity.  Eating a healthy diet.  Avoiding tobacco and drug use.  Limiting alcohol use.  Practicing safe sex.  Taking low doses of aspirin every day.  Taking vitamin and mineral supplements as recommended by your health care provider. What happens during an annual well check? The services and screenings done by your health care provider during your annual well check will depend on your age, overall health, lifestyle risk factors, and family history of disease. Counseling  Your health care provider may ask you questions about your:  Alcohol use.  Tobacco use.  Drug  use.  Emotional well-being.  Home and relationship well-being.  Sexual activity.  Eating habits.  History of falls.  Memory and ability to understand (cognition).  Work and work Statistician. Screening  You may have the following tests or measurements:  Height, weight, and BMI.  Blood pressure.  Lipid and cholesterol levels. These may be checked every 5 years, or more frequently if you are over 48 years old.  Skin check.  Lung cancer screening. You may have this screening every year starting at age 44 if you have a 30-pack-year history of smoking and currently smoke or have quit within the past 15 years.  Fecal occult blood test (FOBT) of the stool. You may have this test every year starting at age 76.  Flexible sigmoidoscopy or colonoscopy. You may have a sigmoidoscopy every 5 years or a colonoscopy every 10 years starting at age 9.  Prostate cancer screening. Recommendations will vary depending on your family history and other risks.  Hepatitis C blood test.  Hepatitis B blood test.  Sexually transmitted disease (STD) testing.  Diabetes screening. This is done by checking your blood sugar (glucose) after you have not eaten for a while (fasting). You may have this done every 1-3 years.  Abdominal aortic aneurysm (AAA) screening. You may need this if you are a current or former smoker.  Osteoporosis. You may be screened starting at age 55 if you are at high risk. Talk with your health care provider about your test results, treatment options, and if necessary, the need for more tests. Vaccines  Your health care provider may recommend certain vaccines, such as:  Influenza vaccine. This is recommended every year.  Tetanus, diphtheria, and acellular pertussis (Tdap, Td) vaccine. You  may need a Td booster every 10 years.  Zoster vaccine. You may need this after age 33.  Pneumococcal 13-valent conjugate (PCV13) vaccine. One dose is recommended after age  42.  Pneumococcal polysaccharide (PPSV23) vaccine. One dose is recommended after age 59. Talk to your health care provider about which screenings and vaccines you need and how often you need them. This information is not intended to replace advice given to you by your health care provider. Make sure you discuss any questions you have with your health care provider. Document Released: 11/30/2015 Document Revised: 07/23/2016 Document Reviewed: 09/04/2015 Elsevier Interactive Patient Education  2017 [place] Prevention in the Home Falls can cause injuries. They can happen to people of all ages. There are many things you can do to make your home safe and to help prevent falls. What can I do on the outside of my home?  Regularly fix the edges of walkways and driveways and fix any cracks.  Remove anything that might make you trip as you walk through a door, such as a raised step or threshold.  Trim any bushes or trees on the path to your home.  Use bright outdoor lighting.  Clear any walking paths of anything that might make someone trip, such as rocks or tools.  Regularly check to see if handrails are loose or broken. Make sure that both sides of any steps have handrails.  Any raised decks and porches should have guardrails on the edges.  Have any leaves, snow, or ice cleared regularly.  Use sand or salt on walking paths during winter.  Clean up any spills in your garage right away. This includes oil or grease spills. What can I do in the bathroom?  Use night lights.  Install grab bars by the toilet and in the tub and shower. Do not use towel bars as grab bars.  Use non-skid mats or decals in the tub or shower.  If you need to sit down in the shower, use a plastic, non-slip stool.  Keep the floor dry. Clean up any water that spills on the floor as soon as it happens.  Remove soap buildup in the tub or shower regularly.  Attach bath mats securely with double-sided  non-slip rug tape.  Do not have throw rugs and other things on the floor that can make you trip. What can I do in the bedroom?  Use night lights.  Make sure that you have a light by your bed that is easy to reach.  Do not use any sheets or blankets that are too big for your bed. They should not hang down onto the floor.  Have a firm chair that has side arms. You can use this for support while you get dressed.  Do not have throw rugs and other things on the floor that can make you trip. What can I do in the kitchen?  Clean up any spills right away.  Avoid walking on wet floors.  Keep items that you use a lot in easy-to-reach places.  If you need to reach something above you, use a strong step stool that has a grab bar.  Keep electrical cords out of the way.  Do not use floor polish or wax that makes floors slippery. If you must use wax, use non-skid floor wax.  Do not have throw rugs and other things on the floor that can make you trip. What can I do with my stairs?  Do not leave any items  on the stairs.  Make sure that there are handrails on both sides of the stairs and use them. Fix handrails that are broken or loose. Make sure that handrails are as long as the stairways.  Check any carpeting to make sure that it is firmly attached to the stairs. Fix any carpet that is loose or worn.  Avoid having throw rugs at the top or bottom of the stairs. If you do have throw rugs, attach them to the floor with carpet tape.  Make sure that you have a light switch at the top of the stairs and the bottom of the stairs. If you do not have them, ask someone to add them for you. What else can I do to help prevent falls?  Wear shoes that:  Do not have high heels.  Have rubber bottoms.  Are comfortable and fit you well.  Are closed at the toe. Do not wear sandals.  If you use a stepladder:  Make sure that it is fully opened. Do not climb a closed stepladder.  Make sure that both  sides of the stepladder are locked into place.  Ask someone to hold it for you, if possible.  Clearly mark and make sure that you can see:  Any grab bars or handrails.  First and last steps.  Where the edge of each step is.  Use tools that help you move around (mobility aids) if they are needed. These include:  Canes.  Walkers.  Scooters.  Crutches.  Turn on the lights when you go into a dark area. Replace any light bulbs as soon as they burn out.  Set up your furniture so you have a clear path. Avoid moving your furniture around.  If any of your floors are uneven, fix them.  If there are any pets around you, be aware of where they are.  Review your medicines with your doctor. Some medicines can make you feel dizzy. This can increase your chance of falling. Ask your doctor what other things that you can do to help prevent falls. This information is not intended to replace advice given to you by your health care provider. Make sure you discuss any questions you have with your health care provider. Document Released: 08/30/2009 Document Revised: 04/10/2016 Document Reviewed: 12/08/2014 Elsevier Interactive Patient Education  2017 [employer].

## 2020-12-13 DIAGNOSIS — L820 Inflamed seborrheic keratosis: Secondary | ICD-10-CM | POA: Diagnosis not present

## 2020-12-13 DIAGNOSIS — L570 Actinic keratosis: Secondary | ICD-10-CM | POA: Diagnosis not present

## 2020-12-13 DIAGNOSIS — Z85828 Personal history of other malignant neoplasm of skin: Secondary | ICD-10-CM | POA: Diagnosis not present

## 2020-12-13 DIAGNOSIS — Z08 Encounter for follow-up examination after completed treatment for malignant neoplasm: Secondary | ICD-10-CM | POA: Diagnosis not present

## 2020-12-13 DIAGNOSIS — L538 Other specified erythematous conditions: Secondary | ICD-10-CM | POA: Diagnosis not present

## 2020-12-13 DIAGNOSIS — D235 Other benign neoplasm of skin of trunk: Secondary | ICD-10-CM | POA: Diagnosis not present

## 2020-12-13 DIAGNOSIS — X32XXXA Exposure to sunlight, initial encounter: Secondary | ICD-10-CM | POA: Diagnosis not present

## 2021-01-02 DIAGNOSIS — E119 Type 2 diabetes mellitus without complications: Secondary | ICD-10-CM | POA: Diagnosis not present

## 2021-01-02 DIAGNOSIS — I2510 Atherosclerotic heart disease of native coronary artery without angina pectoris: Secondary | ICD-10-CM | POA: Diagnosis not present

## 2021-01-02 DIAGNOSIS — I700 Atherosclerosis of aorta: Secondary | ICD-10-CM | POA: Diagnosis not present

## 2021-01-02 DIAGNOSIS — E782 Mixed hyperlipidemia: Secondary | ICD-10-CM | POA: Diagnosis not present

## 2021-01-02 DIAGNOSIS — Z23 Encounter for immunization: Secondary | ICD-10-CM | POA: Diagnosis not present

## 2021-01-02 DIAGNOSIS — I10 Essential (primary) hypertension: Secondary | ICD-10-CM | POA: Diagnosis not present

## 2021-01-07 ENCOUNTER — Other Ambulatory Visit: Payer: Self-pay

## 2021-01-07 ENCOUNTER — Ambulatory Visit (INDEPENDENT_AMBULATORY_CARE_PROVIDER_SITE_OTHER): Payer: Medicare Other | Admitting: Podiatry

## 2021-01-07 ENCOUNTER — Encounter: Payer: Self-pay | Admitting: Podiatry

## 2021-01-07 DIAGNOSIS — L03031 Cellulitis of right toe: Secondary | ICD-10-CM | POA: Diagnosis not present

## 2021-01-07 DIAGNOSIS — Z860100 Personal history of colon polyps, unspecified: Secondary | ICD-10-CM | POA: Insufficient documentation

## 2021-01-07 DIAGNOSIS — L603 Nail dystrophy: Secondary | ICD-10-CM

## 2021-01-07 DIAGNOSIS — E119 Type 2 diabetes mellitus without complications: Secondary | ICD-10-CM | POA: Insufficient documentation

## 2021-01-07 DIAGNOSIS — L600 Ingrowing nail: Secondary | ICD-10-CM | POA: Diagnosis not present

## 2021-01-07 DIAGNOSIS — B351 Tinea unguium: Secondary | ICD-10-CM

## 2021-01-07 DIAGNOSIS — L02611 Cutaneous abscess of right foot: Secondary | ICD-10-CM | POA: Diagnosis not present

## 2021-01-07 DIAGNOSIS — D509 Iron deficiency anemia, unspecified: Secondary | ICD-10-CM | POA: Insufficient documentation

## 2021-01-07 DIAGNOSIS — R1013 Epigastric pain: Secondary | ICD-10-CM | POA: Insufficient documentation

## 2021-01-07 DIAGNOSIS — L601 Onycholysis: Secondary | ICD-10-CM | POA: Diagnosis not present

## 2021-01-07 MED ORDER — CEPHALEXIN 500 MG PO CAPS: 500.0000 mg | ORAL_CAPSULE | Freq: Three times a day (TID) | ORAL | 0 refills | Status: DC

## 2021-01-07 NOTE — Progress Notes (Signed)
  Subjective:  Patient ID: Corey Daniel, male    DOB: 03/23/1946,  MRN: [id number]  Chief Complaint  Patient presents with  . Nail Problem    Patient presents today for bilat great toenail fungus.  He says right is worse than left and right nail bed has been red, tender to touch and he has been able to squeeze pus from right toenail  He has been using Nonyx fungal nail gel which has helped some    75 y.o. male presents with the above complaint. History confirmed with patient.   Objective:  Physical Exam: warm, good capillary refill, no trophic changes or ulcerative lesions, normal DP and PT pulses and normal sensory exam. Left Foot: Medial border of hallux nail is ingrowing and has yellow and brown discoloration Right Foot: Mycotic hallux nail with yellow-brown discoloration, subungual debris, the proximal nail fold is red and erythematous, no active drainage currently   Assessment:   1. Onychomycosis   2. Ingrowing left great toenail   3. Cellulitis and abscess of toe of right foot   4. Nail dystrophy      Plan:  Patient was evaluated and treated and all questions answered.   Discussed the etiology and treatment options for the condition in detail with the patient. Educated patient on the topical and oral treatment options for mycotic nails. Recommended debridement of the nails today. Sharp and mechanical debridement performed of all painful and mycotic nails today. Nails debrided in length and thickness using a nail nipper to level of comfort. Discussed treatment options including appropriate shoe gear. Follow up as needed for painful nails.  The left hallux I remove the offending nail border in a slant back fashion to alleviate pain and pressure.  If this becomes recurrent or causes paronychia will consider permanent partial nail avulsion.  For the right hallux think likely he has mild cellulitis and resolving infection from bacterial penetrance through the nail plate.  A culture of  the nail was taken and sent to [facility] pathology for fungal culture.  I have given him prescription for 10 days of Keflex to see if this alleviates the cellulitis.  I think pending final culture results we should treat with oral and topical medications.    Return in about 3 weeks (around 01/28/2021) for nail re-check.

## 2021-01-21 ENCOUNTER — Other Ambulatory Visit: Payer: Medicare Other

## 2021-01-21 ENCOUNTER — Other Ambulatory Visit: Payer: Self-pay

## 2021-01-21 DIAGNOSIS — B351 Tinea unguium: Secondary | ICD-10-CM | POA: Diagnosis not present

## 2021-01-21 DIAGNOSIS — E782 Mixed hyperlipidemia: Secondary | ICD-10-CM

## 2021-01-21 DIAGNOSIS — R7303 Prediabetes: Secondary | ICD-10-CM

## 2021-01-22 ENCOUNTER — Encounter: Payer: Self-pay | Admitting: *Deleted

## 2021-01-22 LAB — LIPID PANEL
Cholesterol: 141 mg/dL (ref <200)
HDL: 39 mg/dL — ABNORMAL LOW (ref >=40)
LDL Cholesterol (Calc): 69 mg/dL (calc)
Non-HDL Cholesterol (Calc): 102 mg/dL (calc) (ref <130)
Total CHOL/HDL Ratio: 3.6 (calc) (ref <5.0)
Triglycerides: 253 mg/dL — ABNORMAL HIGH (ref <150)

## 2021-01-22 LAB — HEPATIC FUNCTION PANEL
AG Ratio: 1.7 (calc) (ref 1.0–2.5)
ALT: 16 U/L (ref 9–46)
AST: 23 U/L (ref 10–35)
Albumin: 4.4 g/dL (ref 3.6–5.1)
Alkaline phosphatase (APISO): 49 U/L (ref 35–144)
Bilirubin, Direct: 0.1 mg/dL (ref 0.0–0.2)
Globulin: 2.6 g/dL (calc) (ref 1.9–3.7)
Indirect Bilirubin: 0.2 mg/dL (calc) (ref 0.2–1.2)
Total Bilirubin: 0.3 mg/dL (ref 0.2–1.2)
Total Protein: 7 g/dL (ref 6.1–8.1)

## 2021-01-22 LAB — HEMOGLOBIN A1C
Hgb A1c MFr Bld: 6.4 % of total Hgb — ABNORMAL HIGH (ref <5.7)
Mean Plasma Glucose: 137 mg/dL
eAG (mmol/L): 7.6 mmol/L

## 2021-01-24 DIAGNOSIS — M1611 Unilateral primary osteoarthritis, right hip: Secondary | ICD-10-CM | POA: Diagnosis not present

## 2021-01-28 ENCOUNTER — Encounter: Payer: Self-pay | Admitting: Family Medicine

## 2021-01-28 ENCOUNTER — Ambulatory Visit (INDEPENDENT_AMBULATORY_CARE_PROVIDER_SITE_OTHER): Payer: Medicare Other | Admitting: Family Medicine

## 2021-01-28 ENCOUNTER — Other Ambulatory Visit: Payer: Self-pay | Admitting: Family Medicine

## 2021-01-28 ENCOUNTER — Other Ambulatory Visit: Payer: Self-pay

## 2021-01-28 VITALS — BP 127/66 | HR 66 | Ht 71.0 in | Wt 205.2 lb

## 2021-01-28 DIAGNOSIS — M1611 Unilateral primary osteoarthritis, right hip: Secondary | ICD-10-CM | POA: Insufficient documentation

## 2021-01-28 DIAGNOSIS — C8599 Non-Hodgkin lymphoma, unspecified, extranodal and solid organ sites: Secondary | ICD-10-CM

## 2021-01-28 DIAGNOSIS — E782 Mixed hyperlipidemia: Secondary | ICD-10-CM

## 2021-01-28 DIAGNOSIS — I10 Essential (primary) hypertension: Secondary | ICD-10-CM

## 2021-01-28 DIAGNOSIS — R7303 Prediabetes: Secondary | ICD-10-CM

## 2021-01-28 DIAGNOSIS — I700 Atherosclerosis of aorta: Secondary | ICD-10-CM

## 2021-01-28 DIAGNOSIS — Z0000 Encounter for general adult medical examination without abnormal findings: Secondary | ICD-10-CM

## 2021-01-28 DIAGNOSIS — D696 Thrombocytopenia, unspecified: Secondary | ICD-10-CM

## 2021-01-28 DIAGNOSIS — M25551 Pain in right hip: Secondary | ICD-10-CM

## 2021-01-28 DIAGNOSIS — R351 Nocturia: Secondary | ICD-10-CM

## 2021-01-28 DIAGNOSIS — G8929 Other chronic pain: Secondary | ICD-10-CM | POA: Diagnosis not present

## 2021-01-28 NOTE — Patient Instructions (Addendum)
Thank you for coming to the office today.  Recent Labs    07/24/20 0759 01/21/21 0000  HGBA1C 6.2* 6.4*   Continue on current Rosuvastatin.   DUE for FASTING BLOOD WORK (no food or drink after midnight before the lab appointment, only water or coffee without cream/sugar on the morning of)  SCHEDULE "Lab Only" visit in the morning at the clinic for lab draw in 6 MONTHS   - Make sure Lab Only appointment is at about 1 week before your next appointment, so that results will be available  For Lab Results, once available within 2-3 days of blood draw, you can can log in to MyChart online to view your results and a brief explanation. Also, we can discuss results at next follow-up visit.   Please schedule a Follow-up Appointment to: Return in about 6 months (around 07/31/2021) for 6 month fasting lab only then 1 week later follow-up Yearly Medicare Checkup lab results.  If you have any other questions or concerns, please feel free to call the office or send a message through [place]. You may also schedule an earlier appointment if necessary.  Additionally, you may be receiving a survey about your experience at our office within a few days to 1 week by e-mail or mail. We value your feedback.  [name], DO [place]

## 2021-01-28 NOTE — Progress Notes (Signed)
Subjective:    Patient ID: Corey Daniel, male    DOB: [birth year], 75 y.o.   MRN: [id number]  Corey Daniel is a 75 y.o. male presenting on 01/28/2021 for Prediabetes and Hip Pain   HPI  Right Hip OA/DJD w/ Groin Pain Reports over past several months he has been walking more. He would develop some recurring groin pain, and eventually pain radiated into Right thigh and buttocks and knee on R side. - He has chronic low back DJD s/p spinal arthr - He has seen [facility] as a walk in patient recently, had X-rays done of R hip, identified DJD with bone spur most likely R hip, recommended hip injection - they would get an insurance approval.  Gastric Lymphoma Followed by GI and Oncology Dr Rogue Bussing Recent EGD Dr [facility] Found some inflammation in October repeat EGD to review if any further inflammation and look for any recurrence of cancer.  HYPERLIPIDEMIA: Followed by Cardiology - Reports no concerns. Last lipid panel 01/2021, controlled LDL - Currently taking Rosuvastatin 5mg  2 x week, tolerating well without side effects or myalgias   Health Maintenance: Did not get Flu Shot Did not get COVID vaccine.  Depression screen [facility] 2/9 12/04/2020 12/19/2019 11/29/2019  Decreased Interest 0 0 0  Down, Depressed, Hopeless 0 0 0  PHQ - 2 Score 0 0 0    Social History   Tobacco Use  . Smoking status: Former Smoker    Packs/day: 0.50    Years: 30.00    Pack years: 15.00    Types: Cigarettes    Quit date: 1983    Years since quitting: 39.2  . Smokeless tobacco: Never Used  Vaping Use  . Vaping Use: Never used  Substance Use Topics  . Alcohol use: No  . Drug use: No    Review of Systems  Constitutional: Negative for activity change, appetite change, chills, diaphoresis, fatigue and fever.  HENT: Negative for congestion and hearing loss.   Eyes: Negative for visual disturbance.  Respiratory: Negative for apnea, cough, chest tightness, shortness of breath and wheezing.    Cardiovascular: Negative for chest pain, palpitations and leg swelling.  Gastrointestinal: Negative for abdominal pain, constipation, diarrhea, nausea and vomiting.  Endocrine: Negative for cold intolerance.  Genitourinary: Negative for dysuria, frequency and hematuria.  Musculoskeletal: Positive for arthralgias. Negative for back pain, gait problem and neck pain.  Skin: Negative for rash.  Allergic/Immunologic: Negative for environmental allergies.  Neurological: Negative for dizziness, weakness, light-headedness, numbness and headaches.  Hematological: Negative for adenopathy.  Psychiatric/Behavioral: Negative for behavioral problems, dysphoric mood and sleep disturbance. The patient is not nervous/anxious.    Per HPI unless specifically indicated above     Objective:    BP 127/66   Pulse 66   Ht 5\' 11"  (1.803 m)   Wt 205 lb 3.2 oz (93.1 kg)   SpO2 99%   BMI 28.62 kg/m   Wt Readings from Last 3 Encounters:  01/28/21 205 lb 3.2 oz (93.1 kg)  12/04/20 208 lb 8 oz (94.6 kg)  10/18/20 206 lb 9.6 oz (93.7 kg)    Physical Exam Vitals and nursing note reviewed.  Constitutional:      General: He is not in acute distress.    Appearance: He is well-developed. He is not diaphoretic.     Comments: Well-appearing, comfortable, cooperative  HENT:     Head: Normocephalic and atraumatic.  Eyes:     General:        Right eye:  No discharge.        Left eye: No discharge.     Conjunctiva/sclera: Conjunctivae normal.  Neck:     Thyroid: No thyromegaly.  Cardiovascular:     Rate and Rhythm: Normal rate and regular rhythm.     Heart sounds: Normal heart sounds. No murmur heard.   Pulmonary:     Effort: Pulmonary effort is normal. No respiratory distress.     Breath sounds: Normal breath sounds. No wheezing or rales.  Musculoskeletal:        General: No swelling or tenderness. Normal range of motion.     Cervical back: Normal range of motion and neck supple.  Lymphadenopathy:      Cervical: No cervical adenopathy.  Skin:    General: Skin is warm and dry.     Findings: No erythema or rash.  Neurological:     Mental Status: He is alert and oriented to person, place, and time.  Psychiatric:        Behavior: Behavior normal.     Comments: Well groomed, good eye contact, normal speech and thoughts       Results for orders placed or performed in visit on 01/21/21  Lipid panel  Result Value Ref Range   Cholesterol 141 <200 mg/dL   HDL 39 (L) > OR = 40 mg/dL   Triglycerides 253 (H) <150 mg/dL   LDL Cholesterol (Calc) 69 mg/dL (calc)   Total CHOL/HDL Ratio 3.6 <5.0 (calc)   Non-HDL Cholesterol (Calc) 102 <130 mg/dL (calc)  Hemoglobin A1c  Result Value Ref Range   Hgb A1c MFr Bld 6.4 (H) <5.7 % of total Hgb   Mean Plasma Glucose 137 mg/dL   eAG (mmol/L) 7.6 mmol/L  Hepatic function panel  Result Value Ref Range   Total Protein 7.0 6.1 - 8.1 g/dL   Albumin 4.4 3.6 - 5.1 g/dL   Globulin 2.6 1.9 - 3.7 g/dL (calc)   AG Ratio 1.7 1.0 - 2.5 (calc)   Total Bilirubin 0.3 0.2 - 1.2 mg/dL   Bilirubin, Direct 0.1 0.0 - 0.2 mg/dL   Indirect Bilirubin 0.2 0.2 - 1.2 mg/dL (calc)   Alkaline phosphatase (APISO) 49 35 - 144 U/L   AST 23 10 - 35 U/L   ALT 16 9 - 46 U/L      Assessment & Plan:   Problem List Items Addressed This Visit    Primary osteoarthritis of right hip   Pre-diabetes - Primary   Mixed hyperlipidemia   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HCC)   Atherosclerosis of aorta ([place])    Other Visit Diagnoses    Chronic right hip pain          #Chronic R hip pain Osteoarthritis multiple joints Recent management by [facility], had X-rays, scan to chart Continue on management per ortho, upcoming injection due  #Hyperlipidemia Excellent result on intermittent statin Rosuvastatin. Improved LDL control  #PreDM A1c at 6.4, slightly increased Counseling on lifestyle diet exercise   No orders of the defined types were placed in this encounter.     Follow up  plan: Return in about 6 months (around 07/31/2021) for 6 month fasting lab only then 1 week later follow-up Yearly Medicare Checkup lab results.  Future labs ordered for 07/2021  [name], [facility] 01/28/2021, 8:14 AM

## 2021-01-30 ENCOUNTER — Ambulatory Visit (INDEPENDENT_AMBULATORY_CARE_PROVIDER_SITE_OTHER): Payer: Medicare Other | Admitting: Podiatry

## 2021-01-30 ENCOUNTER — Encounter: Payer: Self-pay | Admitting: Podiatry

## 2021-01-30 ENCOUNTER — Other Ambulatory Visit: Payer: Self-pay

## 2021-01-30 DIAGNOSIS — L600 Ingrowing nail: Secondary | ICD-10-CM

## 2021-01-30 DIAGNOSIS — B351 Tinea unguium: Secondary | ICD-10-CM | POA: Diagnosis not present

## 2021-01-30 DIAGNOSIS — L02611 Cutaneous abscess of right foot: Secondary | ICD-10-CM

## 2021-01-30 DIAGNOSIS — L03031 Cellulitis of right toe: Secondary | ICD-10-CM

## 2021-01-30 MED ORDER — CICLOPIROX 8 % EX SOLN: Freq: Every day | CUTANEOUS | 2 refills | Status: DC

## 2021-01-30 MED ORDER — TERBINAFINE HCL 250 MG PO TABS: 250.0000 mg | ORAL_TABLET | Freq: Every day | ORAL | 0 refills | Status: AC

## 2021-01-30 NOTE — Addendum Note (Signed)
Addended by: Lolita Rieger on: 01/30/2021 10:24 AM   Modules accepted: Orders

## 2021-01-30 NOTE — Progress Notes (Signed)
  Subjective:  Patient ID: Corey Daniel, male    DOB: [birth year],  MRN: [id number]  Chief Complaint  Patient presents with  . Nail Problem    "the redness is gone in my toe but the nail looks the same"  he is here to discuss [facility] results    75 y.o. male presents with the above complaint. History confirmed with patient.  Doing much better after the antibiotics, he has lab work done by his primary care doctor.  Objective:  Physical Exam: warm, good capillary refill, no trophic changes or ulcerative lesions, normal DP and PT pulses and normal sensory exam. Left Foot: Medial border of hallux nail is ingrowing and has yellow and brown discoloration, third and fifth toes have superficial white discoloration Right Foot: Mycotic hallux nail with yellow-brown discoloration, subungual debris, cellulitis has resolved     [facility] pathology results with onychomycosis, moderate fungal growth, species of Trichophyton and Pseudomonas  LFTs normal  Assessment:   1. Onychomycosis   2. Ingrowing left great toenail   3. Cellulitis and abscess of toe of right foot      Plan:  Patient was evaluated and treated and all questions answered.   Reviewed the fungal culture results with the patient as well as his most recent lab work.  Recommend oral and topical treatment with both Penlac and Lamisil 90-day course.  I think the Pseudomonas is likely contaminant and do not think it is pathologic and without it was treating with oral antibiotics at this point.  Return in 4 months after evaluation.  Photographs were taken.   Return in about 4 months (around 06/01/2021) for check nail fungus after treatment.

## 2021-02-04 DIAGNOSIS — M25551 Pain in right hip: Secondary | ICD-10-CM | POA: Diagnosis not present

## 2021-02-25 DIAGNOSIS — M1611 Unilateral primary osteoarthritis, right hip: Secondary | ICD-10-CM | POA: Diagnosis not present

## 2021-03-18 DIAGNOSIS — M1611 Unilateral primary osteoarthritis, right hip: Secondary | ICD-10-CM | POA: Diagnosis not present

## 2021-04-18 ENCOUNTER — Other Ambulatory Visit: Payer: Self-pay

## 2021-04-18 ENCOUNTER — Inpatient Hospital Stay: Payer: Medicare Other | Attending: Internal Medicine

## 2021-04-18 ENCOUNTER — Inpatient Hospital Stay (HOSPITAL_BASED_OUTPATIENT_CLINIC_OR_DEPARTMENT_OTHER): Payer: Medicare Other | Admitting: Internal Medicine

## 2021-04-18 ENCOUNTER — Encounter: Payer: Self-pay | Admitting: Internal Medicine

## 2021-04-18 DIAGNOSIS — Z7984 Long term (current) use of oral hypoglycemic drugs: Secondary | ICD-10-CM | POA: Diagnosis not present

## 2021-04-18 DIAGNOSIS — Z87891 Personal history of nicotine dependence: Secondary | ICD-10-CM | POA: Diagnosis not present

## 2021-04-18 DIAGNOSIS — E785 Hyperlipidemia, unspecified: Secondary | ICD-10-CM | POA: Insufficient documentation

## 2021-04-18 DIAGNOSIS — I252 Old myocardial infarction: Secondary | ICD-10-CM | POA: Insufficient documentation

## 2021-04-18 DIAGNOSIS — D509 Iron deficiency anemia, unspecified: Secondary | ICD-10-CM | POA: Insufficient documentation

## 2021-04-18 DIAGNOSIS — I2510 Atherosclerotic heart disease of native coronary artery without angina pectoris: Secondary | ICD-10-CM | POA: Diagnosis not present

## 2021-04-18 DIAGNOSIS — I10 Essential (primary) hypertension: Secondary | ICD-10-CM | POA: Insufficient documentation

## 2021-04-18 DIAGNOSIS — Z79899 Other long term (current) drug therapy: Secondary | ICD-10-CM | POA: Insufficient documentation

## 2021-04-18 DIAGNOSIS — C8599 Non-Hodgkin lymphoma, unspecified, extranodal and solid organ sites: Secondary | ICD-10-CM

## 2021-04-18 DIAGNOSIS — Z8572 Personal history of non-Hodgkin lymphomas: Secondary | ICD-10-CM | POA: Insufficient documentation

## 2021-04-18 DIAGNOSIS — Z7982 Long term (current) use of aspirin: Secondary | ICD-10-CM | POA: Diagnosis not present

## 2021-04-18 LAB — CBC WITH DIFFERENTIAL/PLATELET
Abs Immature Granulocytes: 0.02 10*3/uL (ref 0.00–0.07)
Basophils Absolute: 0.1 10*3/uL (ref 0.0–0.1)
Basophils Relative: 1 %
Eosinophils Absolute: 0.2 10*3/uL (ref 0.0–0.5)
Eosinophils Relative: 4 %
HCT: 40 % (ref 39.0–52.0)
Hemoglobin: 14 g/dL (ref 13.0–17.0)
Immature Granulocytes: 0 %
Lymphocytes Relative: 25 %
Lymphs Abs: 1.3 10*3/uL (ref 0.7–4.0)
MCH: 31.3 pg (ref 26.0–34.0)
MCHC: 35 g/dL (ref 30.0–36.0)
MCV: 89.5 fL (ref 80.0–100.0)
Monocytes Absolute: 0.7 10*3/uL (ref 0.1–1.0)
Monocytes Relative: 13 %
Neutro Abs: 3 10*3/uL (ref 1.7–7.7)
Neutrophils Relative %: 57 %
Platelets: 162 10*3/uL (ref 150–400)
RBC: 4.47 MIL/uL (ref 4.22–5.81)
RDW: 13 % (ref 11.5–15.5)
WBC: 5.3 10*3/uL (ref 4.0–10.5)
nRBC: 0 % (ref 0.0–0.2)

## 2021-04-18 LAB — COMPREHENSIVE METABOLIC PANEL
ALT: 17 U/L (ref 0–44)
AST: 21 U/L (ref 15–41)
Albumin: 4.1 g/dL (ref 3.5–5.0)
Alkaline Phosphatase: 51 U/L (ref 38–126)
Anion gap: 12 (ref 5–15)
BUN: 28 mg/dL — ABNORMAL HIGH (ref 8–23)
CO2: 21 mmol/L — ABNORMAL LOW (ref 22–32)
Calcium: 9 mg/dL (ref 8.9–10.3)
Chloride: 103 mmol/L (ref 98–111)
Creatinine, Ser: 1.16 mg/dL (ref 0.61–1.24)
GFR, Estimated: >60 mL/min (ref >60)
Glucose, Bld: 118 mg/dL — ABNORMAL HIGH (ref 70–99)
Potassium: 4 mmol/L (ref 3.5–5.1)
Sodium: 136 mmol/L (ref 135–145)
Total Bilirubin: 0.7 mg/dL (ref 0.3–1.2)
Total Protein: 7.1 g/dL (ref 6.5–8.1)

## 2021-04-18 LAB — LACTATE DEHYDROGENASE: LDH: 120 U/L (ref 98–192)

## 2021-04-18 NOTE — Progress Notes (Signed)
Pt in for follow up, denies any concerns.  States having hip replacement in August.

## 2021-04-18 NOTE — Progress Notes (Signed)
[facility] OFFICE PROGRESS NOTE  Patient Care Team: Olin Hauser, DO as PCP - General (Family Medicine)  Cancer Staging No matching staging information was found for the patient.   Oncology History Overview Note  # AUG 2018- DIFFUSE LARGE B CELL LYMPHOMA STOMACH-STAGE IE [BMBx-NEG]Fundus ;CD-20 pos;variable- bcl6/mum-?? GCB vs. ABC subtype;  [EGD for IDA; Dr.Hung; [place]]- NEG for FISH for;myc; bcl-6; bcl-2;11:14; MALT [8q21];NEG for CD-10; ki-67-70%; SEP PET- uptake in stomach. Hepatitis panel-NEG; BMBx-NEG  # SEP 25th 2018- R-CHOP x3 ; RT [until jan 3rd 2019]; PET Feb 2019- CR; s/p EGD- NEG [March 2019]; EGD/colo [10 polyps][2021-July]; EGD- in 2022; colo-2024.   # AUG 2018- H.pylori positive/stomach body.   # IDA-resolved  # CAD [s/p CABG; Dr.fath];MUGA scan [sep 2018]- 56.5%; port explanted.  ---------------------------------------------------------------   DIAGNOSIS: [ aug 2018]- DLBCL OD STOMACH  STAGE: IE ; GOALS: curative  CURRENT/MOST RECENT THERAPY;  surveillance    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[place])      INTERVAL HISTORY:  Corey Daniel 75 y.o.  male pleasant patient above history of diffuse large B-cell lymphoma of the stomach is here for follow-up.  Patient denies any new lumps or bumps.  Appetite is good.  No weight loss.  Nausea vomiting.   Review of Systems  Constitutional: Negative for chills, diaphoresis, fever, malaise/fatigue and weight loss.  HENT: Negative for nosebleeds and sore throat.   Eyes: Negative for double vision.  Respiratory: Negative for cough, hemoptysis, sputum production, shortness of breath and wheezing.   Cardiovascular: Negative for chest pain, palpitations, orthopnea and leg swelling.  Gastrointestinal: Negative for abdominal pain, blood in stool, constipation, diarrhea, heartburn, melena, nausea and vomiting.  Genitourinary: Negative for dysuria, frequency and urgency.  Musculoskeletal: Positive for back pain.  Negative for joint pain.  Skin: Negative.  Negative for itching and rash.  Neurological: Negative for dizziness, tingling, focal weakness, weakness and headaches.  Endo/Heme/Allergies: Does not bruise/bleed easily.  Psychiatric/Behavioral: Negative for depression. The patient is not nervous/anxious and does not have insomnia.       PAST MEDICAL HISTORY :  Past Medical History:  Diagnosis Date  . Arthritis   . ASCVD (arteriosclerotic cardiovascular disease)   . Back pain    with radiation  . Cancer ([place])    lymphoma  . Coronary artery disease   . Hyperlipidemia   . Hypertension   . IDA (iron deficiency anemia)   . Lower leg pain   . Myocardial infarction ([place])   . Prediabetes     PAST SURGICAL HISTORY :   Past Surgical History:  Procedure Laterality Date  . AUTOGRAFT STRUCTURAL ICBG OBTAINED SEPARATE INCISION FOR SPINE SURGERY   Bilateral 07/15/2013  . BACK SURGERY    . CATARACT EXTRACTION  2010  . COLONOSCOPY  06/2017  . CORONARY ARTERY BYPASS GRAFT    . IR IMAGING GUIDED PORT INSERTION  07/31/2017  . IR REMOVAL TUN ACCESS W/ PORT W/O FL MOD SED  02/12/2018  . LAMINECTOMY LUMBAR EXCISON/EVACUATION EXTRADURAL INTRASPINAL LESION Bilateral 07/15/2013     . LAMINOTOMY REEXPLORATION POSTERIOR LUMBAR W/NERVE DECOMP & DISCECTOMY Bilateral 07/15/2013     . UPPER GI ENDOSCOPY  06/2017    FAMILY HISTORY :   Family History  Problem Relation Age of Onset  . Heart disease Mother   . Coarctation of the aorta Brother     SOCIAL HISTORY:   Social History   Tobacco Use  . Smoking status: Former Smoker    Packs/day: 0.50    Years:  30.00    Pack years: 15.00    Types: Cigarettes    Quit date: 74    Years since quitting: 39.4  . Smokeless tobacco: Never Used  Vaping Use  . Vaping Use: Never used  Substance Use Topics  . Alcohol use: No  . Drug use: No    ALLERGIES:  is allergic to tramadol.  MEDICATIONS:  Current Outpatient Medications  Medication Sig Dispense Refill   . aspirin 81 MG chewable tablet Chew 81 mg by mouth daily.     . carvedilol (COREG) 3.125 MG tablet Take 1 tablet (3.125 mg total) by mouth 2 (two) times daily with a meal. 180 tablet 3  . Cholecalciferol (VITAMIN D3) 2000 UNITS capsule Take 2,000 Units by mouth daily.     . ciclopirox (PENLAC) 8 % solution Apply topically at bedtime. Apply over nail and surrounding skin. Apply daily over previous coat. After seven (7) days, may remove with alcohol and continue cycle. 6.6 mL 2  . Coenzyme Q10 100 MG capsule Take by mouth.    . ezetimibe (ZETIA) 10 MG tablet Take 1 tablet (10 mg total) by mouth at bedtime. 90 tablet 3  . Fish Oil-Cholecalciferol (FISH OIL + D3) 1000-1000 MG-UNIT CAPS Take by mouth.    . fluticasone (FLONASE) 50 MCG/ACT nasal spray Place into the nose.    Marland Kitchen lisinopril (ZESTRIL) 2.5 MG tablet Take 1 tablet (2.5 mg total) by mouth daily. 90 tablet 3  . loratadine (CLARITIN) 10 MG tablet Take 1 tablet by mouth daily.    . metFORMIN (GLUCOPHAGE-XR) 500 MG 24 hr tablet Take 1 tablet (500 mg total) by mouth daily with breakfast. 90 tablet 3  . rosuvastatin (CRESTOR) 5 MG tablet Take 1 tablet (5 mg total) by mouth once a week. (Patient taking differently: Take 5 mg by mouth once a week. 2 times a week) 12 tablet 3  . terbinafine (LAMISIL) 250 MG tablet Take 1 tablet (250 mg total) by mouth daily. 90 tablet 0  . cephALEXin (KEFLEX) 500 MG capsule Take 1 capsule (500 mg total) by mouth 3 (three) times daily. (Patient not taking: Reported on 04/18/2021) 30 capsule 0   No current facility-administered medications for this visit.    PHYSICAL EXAMINATION: ECOG PERFORMANCE STATUS: 0 - Asymptomatic  BP 117/82 (BP Location: Left Arm, Patient Position: Sitting)   Pulse 71   Temp (!) 96.7 F (35.9 C) (Tympanic)   Resp 16   Wt 92.1 kg   SpO2 96%   BMI 28.31 kg/m   Filed Weights   04/18/21 1120  Weight: 92.1 kg   Physical Exam HENT:     Head: Normocephalic and atraumatic.      Mouth/Throat:     Pharynx: No oropharyngeal exudate.  Eyes:     Pupils: Pupils are equal, round, and reactive to light.  Cardiovascular:     Rate and Rhythm: Normal rate and regular rhythm.  Pulmonary:     Effort: No respiratory distress.     Breath sounds: No wheezing.  Abdominal:     General: Bowel sounds are normal. There is no distension.     Palpations: Abdomen is soft. There is no mass.     Tenderness: There is no abdominal tenderness. There is no guarding or rebound.  Musculoskeletal:        General: No tenderness. Normal range of motion.     Cervical back: Normal range of motion and neck supple.  Skin:    General: Skin is warm.  Neurological:     Mental Status: He is alert and oriented to person, place, and time.  Psychiatric:        Mood and Affect: Affect normal.        LABORATORY DATA:  I have reviewed the data as listed    Component Value Date/Time   NA 136 04/18/2021 1055   NA 139 02/06/2016 0806   K 4.0 04/18/2021 1055   CL 103 04/18/2021 1055   CO2 21 (L) 04/18/2021 1055   GLUCOSE 118 (H) 04/18/2021 1055   BUN 28 (H) 04/18/2021 1055   BUN 18 02/06/2016 0806   CREATININE 1.16 04/18/2021 1055   CREATININE 1.11 07/24/2020 0759   CALCIUM 9.0 04/18/2021 1055   PROT 7.1 04/18/2021 1055   PROT 7.1 02/06/2016 0806   ALBUMIN 4.1 04/18/2021 1055   ALBUMIN 4.7 02/06/2016 0806   AST 21 04/18/2021 1055   ALT 17 04/18/2021 1055   ALKPHOS 51 04/18/2021 1055   BILITOT 0.7 04/18/2021 1055   BILITOT 0.5 02/06/2016 0806   GFRNONAA >60 04/18/2021 1055   GFRNONAA 65 07/24/2020 0759   GFRAA 75 07/24/2020 0759    No results found for: SPEP, UPEP  Lab Results  Component Value Date   WBC 5.3 04/18/2021   NEUTROABS 3.0 04/18/2021   HGB 14.0 04/18/2021   HCT 40.0 04/18/2021   MCV 89.5 04/18/2021   PLT 162 04/18/2021      Chemistry      Component Value Date/Time   NA 136 04/18/2021 1055   NA 139 02/06/2016 0806   K 4.0 04/18/2021 1055   CL 103 04/18/2021  1055   CO2 21 (L) 04/18/2021 1055   BUN 28 (H) 04/18/2021 1055   BUN 18 02/06/2016 0806   CREATININE 1.16 04/18/2021 1055   CREATININE 1.11 07/24/2020 0759      Component Value Date/Time   CALCIUM 9.0 04/18/2021 1055   ALKPHOS 51 04/18/2021 1055   AST 21 04/18/2021 1055   ALT 17 04/18/2021 1055   BILITOT 0.7 04/18/2021 1055   BILITOT 0.5 02/06/2016 0806       RADIOGRAPHIC STUDIES: I have personally reviewed the radiological images as listed and agreed with the findings in the report. No results found.   ASSESSMENT & PLAN:  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[place]) #Diffuse large B-cell lymphoma of the stomach; stage I E.  Status post chemotherapy followed by radiation; February 2019 PET scan CR.  Awaiting repeat EGD in oct 2022. STABLE.   # Clinically, no evidence of recurrence.  Continue surveillance every 6 months on a clinical basis without any imaging.   #Chronic back pain s/p back surgery- STABLE.    #COVID-: un-vaccinated.  # DISPOSITION:  # follow up in 6 months- MD-labs-cbc/cmp/ldh; Dr.B     Orders Placed This Encounter  Procedures  . CBC with Differential/Platelet    Standing Status:   Future    Standing Expiration Date:   04/18/2022  . Comprehensive metabolic panel    Standing Status:   Future    Standing Expiration Date:   04/18/2022  . Lactate dehydrogenase    Standing Status:   Future    Standing Expiration Date:   04/18/2022   All questions were answered. The patient knows to call the clinic with any problems, questions or concerns.      Cammie Sickle, MD 04/18/2021 2:21 PM

## 2021-04-18 NOTE — Assessment & Plan Note (Addendum)
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  Status post chemotherapy followed by radiation; February 2019 PET scan CR.  Awaiting repeat EGD in oct 2022. STABLE.   # Clinically, no evidence of recurrence.  Continue surveillance every 6 months on a clinical basis without any imaging.   #Chronic back pain s/p back surgery- STABLE.    #COVID-: un-vaccinated.  # DISPOSITION:  # follow up in 6 months- MD-labs-cbc/cmp/ldh; Dr.B

## 2021-06-03 ENCOUNTER — Telehealth: Payer: Self-pay

## 2021-06-03 ENCOUNTER — Other Ambulatory Visit: Payer: Self-pay

## 2021-06-03 DIAGNOSIS — E782 Mixed hyperlipidemia: Secondary | ICD-10-CM

## 2021-06-03 MED ORDER — ROSUVASTATIN CALCIUM 5 MG PO TABS: 5.0000 mg | ORAL_TABLET | ORAL | 3 refills | Status: DC

## 2021-06-03 NOTE — Telephone Encounter (Signed)
Pt is requesting a refill Crestor called into his mail order

## 2021-06-04 DIAGNOSIS — M1611 Unilateral primary osteoarthritis, right hip: Secondary | ICD-10-CM | POA: Diagnosis not present

## 2021-06-04 DIAGNOSIS — M25551 Pain in right hip: Secondary | ICD-10-CM | POA: Diagnosis not present

## 2021-06-05 ENCOUNTER — Ambulatory Visit (INDEPENDENT_AMBULATORY_CARE_PROVIDER_SITE_OTHER): Payer: Medicare Other | Admitting: Podiatry

## 2021-06-05 ENCOUNTER — Other Ambulatory Visit: Payer: Self-pay

## 2021-06-05 DIAGNOSIS — R52 Pain, unspecified: Secondary | ICD-10-CM | POA: Diagnosis not present

## 2021-06-05 DIAGNOSIS — B351 Tinea unguium: Secondary | ICD-10-CM

## 2021-06-05 DIAGNOSIS — L84 Corns and callosities: Secondary | ICD-10-CM

## 2021-06-05 MED ORDER — TERBINAFINE HCL 250 MG PO TABS: 250.0000 mg | ORAL_TABLET | Freq: Every day | ORAL | 0 refills | Status: AC

## 2021-06-05 MED ORDER — CICLOPIROX 8 % EX SOLN: Freq: Every day | CUTANEOUS | 2 refills | Status: AC

## 2021-06-05 NOTE — Patient Instructions (Signed)
Look for urea 40% cream or ointment and apply to the thickened dry skin / calluses. This can be bought over the counter, at a pharmacy or online such as [employer].  

## 2021-06-05 NOTE — Progress Notes (Signed)
  Subjective:  Patient ID: Corey Daniel, male    DOB: 11-16-1946,  MRN: [id number]  Chief Complaint  Patient presents with   Nail Problem    Thick painful toenails, 4 month follow up    75 y.o. male presents with the above complaint. History confirmed with patient.  He is doing well he notes improvement  Objective:  Physical Exam: warm, good capillary refill, no trophic changes or ulcerative lesions, normal DP and PT pulses and normal sensory exam. Left Foot: Medial border of hallux nail is ingrowing and has yellow and brown discoloration, third and fifth toes have superficial white discoloration Right Foot: Mycotic hallux nail with yellow-brown discoloration, subungual debris, cellulitis has resolved        [place] pathology results with onychomycosis, moderate fungal growth, species of Trichophyton and Pseudomonas  LFTs normal  Assessment:   No diagnosis found.    Plan:  Patient was evaluated and treated and all questions answered.   Doing well has had improvement with terbinafine and Penlac.  Sent him refills for both of these I think we should do another course.  Debrided the nails to a comfortable level.  He also had calluses about 5 bilaterally which I debrided and recommended urea cream for this.   Return in about 4 months (around 10/06/2021) for follow up after nail fungus treatment.

## 2021-06-12 ENCOUNTER — Encounter: Payer: Medicare Other | Admitting: Podiatry

## 2021-06-19 ENCOUNTER — Encounter: Payer: Medicare Other | Admitting: Podiatry

## 2021-06-26 DIAGNOSIS — E119 Type 2 diabetes mellitus without complications: Secondary | ICD-10-CM | POA: Diagnosis not present

## 2021-06-26 DIAGNOSIS — M1611 Unilateral primary osteoarthritis, right hip: Secondary | ICD-10-CM | POA: Diagnosis not present

## 2021-06-26 DIAGNOSIS — Z7984 Long term (current) use of oral hypoglycemic drugs: Secondary | ICD-10-CM | POA: Diagnosis not present

## 2021-06-26 DIAGNOSIS — E785 Hyperlipidemia, unspecified: Secondary | ICD-10-CM | POA: Diagnosis not present

## 2021-06-26 DIAGNOSIS — Z87891 Personal history of nicotine dependence: Secondary | ICD-10-CM | POA: Diagnosis not present

## 2021-06-26 DIAGNOSIS — B351 Tinea unguium: Secondary | ICD-10-CM | POA: Diagnosis not present

## 2021-06-26 DIAGNOSIS — I2510 Atherosclerotic heart disease of native coronary artery without angina pectoris: Secondary | ICD-10-CM | POA: Diagnosis not present

## 2021-06-26 DIAGNOSIS — I10 Essential (primary) hypertension: Secondary | ICD-10-CM | POA: Diagnosis not present

## 2021-06-26 DIAGNOSIS — Z885 Allergy status to narcotic agent status: Secondary | ICD-10-CM | POA: Diagnosis not present

## 2021-06-26 DIAGNOSIS — Z951 Presence of aortocoronary bypass graft: Secondary | ICD-10-CM | POA: Diagnosis not present

## 2021-06-27 DIAGNOSIS — E119 Type 2 diabetes mellitus without complications: Secondary | ICD-10-CM | POA: Diagnosis not present

## 2021-06-27 DIAGNOSIS — I10 Essential (primary) hypertension: Secondary | ICD-10-CM | POA: Diagnosis not present

## 2021-06-27 DIAGNOSIS — E785 Hyperlipidemia, unspecified: Secondary | ICD-10-CM | POA: Diagnosis not present

## 2021-06-27 DIAGNOSIS — I2510 Atherosclerotic heart disease of native coronary artery without angina pectoris: Secondary | ICD-10-CM | POA: Diagnosis not present

## 2021-06-27 DIAGNOSIS — M1611 Unilateral primary osteoarthritis, right hip: Secondary | ICD-10-CM | POA: Diagnosis not present

## 2021-06-27 DIAGNOSIS — B351 Tinea unguium: Secondary | ICD-10-CM | POA: Diagnosis not present

## 2021-07-03 DIAGNOSIS — Z96641 Presence of right artificial hip joint: Secondary | ICD-10-CM | POA: Diagnosis not present

## 2021-07-03 DIAGNOSIS — M25551 Pain in right hip: Secondary | ICD-10-CM | POA: Diagnosis not present

## 2021-07-08 DIAGNOSIS — Z96641 Presence of right artificial hip joint: Secondary | ICD-10-CM | POA: Diagnosis not present

## 2021-07-08 DIAGNOSIS — M25551 Pain in right hip: Secondary | ICD-10-CM | POA: Diagnosis not present

## 2021-07-15 DIAGNOSIS — M25551 Pain in right hip: Secondary | ICD-10-CM | POA: Diagnosis not present

## 2021-07-15 DIAGNOSIS — Z96641 Presence of right artificial hip joint: Secondary | ICD-10-CM | POA: Diagnosis not present

## 2021-07-17 ENCOUNTER — Encounter: Payer: Self-pay | Admitting: Internal Medicine

## 2021-07-17 DIAGNOSIS — I10 Essential (primary) hypertension: Secondary | ICD-10-CM | POA: Diagnosis not present

## 2021-07-17 DIAGNOSIS — I2510 Atherosclerotic heart disease of native coronary artery without angina pectoris: Secondary | ICD-10-CM | POA: Diagnosis not present

## 2021-07-17 DIAGNOSIS — E119 Type 2 diabetes mellitus without complications: Secondary | ICD-10-CM | POA: Diagnosis not present

## 2021-07-17 DIAGNOSIS — C8599 Non-Hodgkin lymphoma, unspecified, extranodal and solid organ sites: Secondary | ICD-10-CM | POA: Diagnosis not present

## 2021-07-17 DIAGNOSIS — E782 Mixed hyperlipidemia: Secondary | ICD-10-CM | POA: Diagnosis not present

## 2021-07-26 DIAGNOSIS — M25551 Pain in right hip: Secondary | ICD-10-CM | POA: Diagnosis not present

## 2021-07-26 DIAGNOSIS — Z96641 Presence of right artificial hip joint: Secondary | ICD-10-CM | POA: Diagnosis not present

## 2021-07-29 DIAGNOSIS — Z96641 Presence of right artificial hip joint: Secondary | ICD-10-CM | POA: Diagnosis not present

## 2021-07-29 DIAGNOSIS — M25551 Pain in right hip: Secondary | ICD-10-CM | POA: Diagnosis not present

## 2021-08-05 ENCOUNTER — Other Ambulatory Visit: Payer: Self-pay

## 2021-08-05 DIAGNOSIS — R351 Nocturia: Secondary | ICD-10-CM

## 2021-08-05 DIAGNOSIS — E782 Mixed hyperlipidemia: Secondary | ICD-10-CM

## 2021-08-05 DIAGNOSIS — Z0000 Encounter for general adult medical examination without abnormal findings: Secondary | ICD-10-CM

## 2021-08-05 DIAGNOSIS — C8599 Non-Hodgkin lymphoma, unspecified, extranodal and solid organ sites: Secondary | ICD-10-CM

## 2021-08-05 DIAGNOSIS — R7303 Prediabetes: Secondary | ICD-10-CM

## 2021-08-05 DIAGNOSIS — D696 Thrombocytopenia, unspecified: Secondary | ICD-10-CM

## 2021-08-05 DIAGNOSIS — I700 Atherosclerosis of aorta: Secondary | ICD-10-CM

## 2021-08-05 DIAGNOSIS — I10 Essential (primary) hypertension: Secondary | ICD-10-CM

## 2021-08-05 DIAGNOSIS — M1611 Unilateral primary osteoarthritis, right hip: Secondary | ICD-10-CM

## 2021-08-06 ENCOUNTER — Other Ambulatory Visit: Payer: Medicare Other

## 2021-08-06 ENCOUNTER — Encounter: Payer: Self-pay | Admitting: Internal Medicine

## 2021-08-06 DIAGNOSIS — M1611 Unilateral primary osteoarthritis, right hip: Secondary | ICD-10-CM | POA: Diagnosis not present

## 2021-08-06 DIAGNOSIS — D696 Thrombocytopenia, unspecified: Secondary | ICD-10-CM | POA: Diagnosis not present

## 2021-08-06 DIAGNOSIS — R351 Nocturia: Secondary | ICD-10-CM | POA: Diagnosis not present

## 2021-08-06 DIAGNOSIS — I700 Atherosclerosis of aorta: Secondary | ICD-10-CM | POA: Diagnosis not present

## 2021-08-06 DIAGNOSIS — Z96641 Presence of right artificial hip joint: Secondary | ICD-10-CM | POA: Diagnosis not present

## 2021-08-06 DIAGNOSIS — R7303 Prediabetes: Secondary | ICD-10-CM | POA: Diagnosis not present

## 2021-08-06 DIAGNOSIS — E782 Mixed hyperlipidemia: Secondary | ICD-10-CM | POA: Diagnosis not present

## 2021-08-06 DIAGNOSIS — Z0000 Encounter for general adult medical examination without abnormal findings: Secondary | ICD-10-CM | POA: Diagnosis not present

## 2021-08-06 DIAGNOSIS — I10 Essential (primary) hypertension: Secondary | ICD-10-CM | POA: Diagnosis not present

## 2021-08-06 DIAGNOSIS — C8599 Non-Hodgkin lymphoma, unspecified, extranodal and solid organ sites: Secondary | ICD-10-CM | POA: Diagnosis not present

## 2021-08-07 ENCOUNTER — Encounter: Payer: Self-pay | Admitting: Internal Medicine

## 2021-08-07 LAB — HEMOGLOBIN A1C
Hgb A1c MFr Bld: 6.2 % of total Hgb — ABNORMAL HIGH (ref <5.7)
Mean Plasma Glucose: 131 mg/dL
eAG (mmol/L): 7.3 mmol/L

## 2021-08-07 LAB — CBC WITH DIFFERENTIAL/PLATELET
Absolute Monocytes: 508 cells/uL (ref 200–950)
Basophils Absolute: 61 cells/uL (ref 0–200)
Basophils Relative: 1.3 %
Eosinophils Absolute: 193 cells/uL (ref 15–500)
Eosinophils Relative: 4.1 %
HCT: 41.3 % (ref 38.5–50.0)
Hemoglobin: 13.7 g/dL (ref 13.2–17.1)
Lymphs Abs: 1114 cells/uL (ref 850–3900)
MCH: 30.5 pg (ref 27.0–33.0)
MCHC: 33.2 g/dL (ref 32.0–36.0)
MCV: 92 fL (ref 80.0–100.0)
MPV: 11.1 fL (ref 7.5–12.5)
Monocytes Relative: 10.8 %
Neutro Abs: 2825 cells/uL (ref 1500–7800)
Neutrophils Relative %: 60.1 %
Platelets: 167 10*3/uL (ref 140–400)
RBC: 4.49 10*6/uL (ref 4.20–5.80)
RDW: 13.3 % (ref 11.0–15.0)
Total Lymphocyte: 23.7 %
WBC: 4.7 10*3/uL (ref 3.8–10.8)

## 2021-08-07 LAB — COMPLETE METABOLIC PANEL WITH GFR
AG Ratio: 1.6 (calc) (ref 1.0–2.5)
ALT: 11 U/L (ref 9–46)
AST: 16 U/L (ref 10–35)
Albumin: 4.6 g/dL (ref 3.6–5.1)
Alkaline phosphatase (APISO): 67 U/L (ref 35–144)
BUN: 23 mg/dL (ref 7–25)
CO2: 27 mmol/L (ref 20–32)
Calcium: 9.9 mg/dL (ref 8.6–10.3)
Chloride: 106 mmol/L (ref 98–110)
Creat: 1.02 mg/dL (ref 0.70–1.28)
Globulin: 2.8 g/dL (calc) (ref 1.9–3.7)
Glucose, Bld: 107 mg/dL — ABNORMAL HIGH (ref 65–99)
Potassium: 4.4 mmol/L (ref 3.5–5.3)
Sodium: 141 mmol/L (ref 135–146)
Total Bilirubin: 0.6 mg/dL (ref 0.2–1.2)
Total Protein: 7.4 g/dL (ref 6.1–8.1)
eGFR: 77 mL/min/{1.73_m2} (ref >=60)

## 2021-08-07 LAB — LIPID PANEL
Cholesterol: 183 mg/dL (ref <200)
HDL: 46 mg/dL (ref >=40)
LDL Cholesterol (Calc): 111 mg/dL (calc) — ABNORMAL HIGH
Non-HDL Cholesterol (Calc): 137 mg/dL (calc) — ABNORMAL HIGH (ref <130)
Total CHOL/HDL Ratio: 4 (calc) (ref <5.0)
Triglycerides: 144 mg/dL (ref <150)

## 2021-08-07 LAB — PSA: PSA: 3.44 ng/mL (ref <=4.00)

## 2021-08-13 ENCOUNTER — Other Ambulatory Visit: Payer: Self-pay | Admitting: Family Medicine

## 2021-08-13 ENCOUNTER — Encounter: Payer: Self-pay | Admitting: Family Medicine

## 2021-08-13 ENCOUNTER — Other Ambulatory Visit: Payer: Self-pay

## 2021-08-13 ENCOUNTER — Ambulatory Visit (INDEPENDENT_AMBULATORY_CARE_PROVIDER_SITE_OTHER): Payer: Medicare Other | Admitting: Family Medicine

## 2021-08-13 VITALS — BP 139/77 | HR 60 | Ht 71.0 in | Wt 207.8 lb

## 2021-08-13 DIAGNOSIS — E782 Mixed hyperlipidemia: Secondary | ICD-10-CM

## 2021-08-13 DIAGNOSIS — I10 Essential (primary) hypertension: Secondary | ICD-10-CM | POA: Diagnosis not present

## 2021-08-13 DIAGNOSIS — C8599 Non-Hodgkin lymphoma, unspecified, extranodal and solid organ sites: Secondary | ICD-10-CM | POA: Diagnosis not present

## 2021-08-13 DIAGNOSIS — I2510 Atherosclerotic heart disease of native coronary artery without angina pectoris: Secondary | ICD-10-CM

## 2021-08-13 DIAGNOSIS — R7303 Prediabetes: Secondary | ICD-10-CM | POA: Diagnosis not present

## 2021-08-13 DIAGNOSIS — M5432 Sciatica, left side: Secondary | ICD-10-CM

## 2021-08-13 DIAGNOSIS — D509 Iron deficiency anemia, unspecified: Secondary | ICD-10-CM

## 2021-08-13 DIAGNOSIS — M5431 Sciatica, right side: Secondary | ICD-10-CM | POA: Diagnosis not present

## 2021-08-13 DIAGNOSIS — M5414 Radiculopathy, thoracic region: Secondary | ICD-10-CM | POA: Diagnosis not present

## 2021-08-13 DIAGNOSIS — M1611 Unilateral primary osteoarthritis, right hip: Secondary | ICD-10-CM

## 2021-08-13 DIAGNOSIS — H9191 Unspecified hearing loss, right ear: Secondary | ICD-10-CM

## 2021-08-13 DIAGNOSIS — M5137 Other intervertebral disc degeneration, lumbosacral region: Secondary | ICD-10-CM

## 2021-08-13 DIAGNOSIS — I700 Atherosclerosis of aorta: Secondary | ICD-10-CM | POA: Diagnosis not present

## 2021-08-13 DIAGNOSIS — M5417 Radiculopathy, lumbosacral region: Secondary | ICD-10-CM

## 2021-08-13 MED ORDER — EZETIMIBE 10 MG PO TABS: 10.0000 mg | ORAL_TABLET | Freq: Every day | ORAL | 3 refills | Status: AC

## 2021-08-13 MED ORDER — METFORMIN HCL ER 500 MG PO TB24: 500.0000 mg | ORAL_TABLET | Freq: Every day | ORAL | 3 refills | Status: AC

## 2021-08-13 MED ORDER — LISINOPRIL 2.5 MG PO TABS: 2.5000 mg | ORAL_TABLET | Freq: Every day | ORAL | 3 refills | Status: AC

## 2021-08-13 MED ORDER — CARVEDILOL 3.125 MG PO TABS: 3.1250 mg | ORAL_TABLET | Freq: Two times a day (BID) | ORAL | 3 refills | Status: AC

## 2021-08-13 MED ORDER — ROSUVASTATIN CALCIUM 5 MG PO TABS: 5.0000 mg | ORAL_TABLET | ORAL | 3 refills | Status: AC

## 2021-08-13 NOTE — Progress Notes (Signed)
Subjective:    Patient ID: Corey Daniel, male    DOB: 26-May-1946, 75 y.o.   MRN: [id number]  Corey Daniel is a 75 y.o. male presenting on 08/13/2021 for Pre-Diabetes and Hypertension   HPI  Chronic Low Back Pain, Lumbar Spinal Stenosis S/p Surgery Spinal Right Hip OA/DJD w/ Groin Pain Chronic Bilateral Knee Pain Reports over past several months he has been walking more. He would develop some recurring groin pain, and eventually pain radiated into Right thigh and buttocks and knee on R side. - He has chronic low back DJD s/p spinal arthr - He has seen [facility] as a walk in patient recently, had X-rays done of R hip, identified DJD with bone spur most likely R hip, recommended hip injection - they would get an insurance approval.   Pre-Diabetes / Overweight BMI >28 Prior elevated A1c in past. Now A1c improved to 6.2 He is improved diet CBGs: Not checking CBG Meds: Metformin XR 550m daily Currently on ACEi Lifestyle: - Diet (improved diet overall - on Atkins diet) - Exercise (regular walking exercise Denies hypoglycemia, polyuria, visual changes, numbness or tingling.   CHRONIC HTN: Reports no concerns, no recent additional BP readings. Current Meds - Lisinopril 2.567mdaily, Carvedilol 3.12564mID Reports good compliance, took meds today. Tolerating well, w/o complaints. Denies CP, dyspnea, HA, edema, dizziness / lightheadedness   HYPERLIPIDEMIA CAD, s/p CABG x 5 in 1997 Aortic Atherosclerosis [facility], he has done stress test nuclear yearly Most recent stress test on 07/11/19 revealed good exercise tolerance with a fixed inferoapical defect with no evidence of reversible ischemia. LV systolic function was normal at 56%. - Last lipid 07/2021 mild elevated LDL - Currently taking Rosuvastatin 5mg48mx weekly - Previously has myalgia on daily or 3 times weekly dosing statin in past. - on ASA 81mg25mly    PMH - Lymphoma Gastric (Diffuse Large  B-Cell Lymphoma) / Thrombocytopenia Followed by Dr BrahmRogue Bussing/2021 and Dr ChrysBaruch Goutycoming GI apt with Dr PatriCarol [facility] tomorrow 9/28 for evaluation of next screening upper endoscopy for gastric cancer.  [facility] with chronic back and hip pain, arthritis, prior hip surgery. Complication with knee arthritis injuries causing complications with his low back pain and spinal stenosis and hip as well.  He has hearing loss from damage to ear   PMH with Agent Orange Exposure - may have contributed to causing Non Hodgkin's Lymphoma gastric cancer.     Health Maintenance:    Prostate Cancer Screening: PSA trend in past 1-3 range, now result is 3.44 (2022). Asymptomatic. Prior results up to 3.66 back 4 years ago   COVID19 vaccine not up date.  Colon CA Screening - Last procedure Colonoscopy 06/23/17 with [facility] (Endoscopy) Dr PatriCarol Adatiple polyps - Last Colonoscopy 08/28/20 Dr Hung Benson [place] 3 years 08/2023   [facility] 12/19/2019 11/29/2019  Decreased Interest 0 0 0  Down, Depressed, Hopeless 0 0 0  PHQ - 2 Score 0 0 0    Past Medical History:  Diagnosis Date   Arthritis    ASCVD (arteriosclerotic cardiovascular disease)    Back pain    with radiation   Cancer (HCC)    lymphoma   Coronary artery disease    Hyperlipidemia    Hypertension    IDA (iron deficiency anemia)    Lower leg pain    Myocardial  infarction [facility])    Prediabetes    Past Surgical History:  Procedure Laterality Date   AUTOGRAFT STRUCTURAL ICBG OBTAINED SEPARATE INCISION FOR SPINE SURGERY   Bilateral 07/15/2013   BACK SURGERY     CATARACT EXTRACTION  2010   COLONOSCOPY  06/2017   CORONARY ARTERY BYPASS GRAFT     IR IMAGING GUIDED PORT INSERTION  07/31/2017   IR REMOVAL TUN ACCESS W/ PORT W/O FL MOD SED  02/12/2018   LAMINECTOMY LUMBAR EXCISON/EVACUATION EXTRADURAL INTRASPINAL LESION Bilateral 07/15/2013       LAMINOTOMY REEXPLORATION POSTERIOR LUMBAR W/NERVE DECOMP & DISCECTOMY Bilateral 07/15/2013      UPPER GI ENDOSCOPY  06/2017   Social History   Socioeconomic History   Marital status: Married    Spouse name: Not on file   Number of children: 1   Years of education: Not on file   Highest education level: High school graduate  Occupational History   Occupation: retired  Tobacco Use   Smoking status: Former    Packs/day: 0.50    Years: 30.00    Pack years: 15.00    Types: Cigarettes    Quit date: 1983    Years since quitting: 39.7   Smokeless tobacco: Never  Vaping Use   Vaping Use: Never used  Substance and Sexual Activity   Alcohol use: No   Drug use: No   Sexual activity: Yes  Other Topics Concern   Not on file  Social History Narrative   Not on file   Social Determinants of Health   Financial Resource Strain: Low Risk    Difficulty of Paying Living Expenses: Not hard at all  Food Insecurity: No Food Insecurity   Worried About Charity fundraiser in the Last Year: Never true   Arboriculturist in the Last Year: Never true  Transportation Needs: No Transportation Needs   Lack of Transportation (Medical): No   Lack of Transportation (Non-Medical): No  Physical Activity: Inactive   Days of Exercise per Week: 0 days   Minutes of Exercise per Session: 0 min  Stress: No Stress Concern Present   Feeling of Stress : Not at all  Social Connections: Not on file  Intimate Partner Violence: Not on file   Family History  Problem Relation Age of Onset   Heart disease Mother    Coarctation of the aorta Brother    Current Outpatient Medications on File Prior to Visit  Medication Sig   aspirin 81 MG chewable tablet Chew 81 mg by mouth daily.    Cholecalciferol (VITAMIN D3) 2000 UNITS capsule Take 2,000 Units by mouth daily.    ciclopirox (PENLAC) 8 % solution Apply topically at bedtime. Apply over nail and surrounding skin. Apply daily over previous coat. After seven (7) days,  may remove with alcohol and continue cycle.   Coenzyme Q10 100 MG capsule Take by mouth.   Fish Oil-Cholecalciferol (FISH OIL + D3) 1000-1000 MG-UNIT CAPS Take by mouth.   fluticasone (FLONASE) 50 MCG/ACT nasal spray Place into the nose.   loratadine (CLARITIN) 10 MG tablet Take 1 tablet by mouth daily.   terbinafine (LAMISIL) 250 MG tablet Take 1 tablet (250 mg total) by mouth daily.   No current facility-administered medications on file prior to visit.    Review of Systems  Constitutional:  Negative for activity change, appetite change, chills, diaphoresis, fatigue and fever.  HENT:  Positive for hearing loss and tinnitus. Negative for congestion.   Eyes:  Negative for  visual disturbance.  Respiratory:  Negative for cough, chest tightness, shortness of breath and wheezing.   Cardiovascular:  Negative for chest pain, palpitations and leg swelling.  Gastrointestinal:  Negative for abdominal pain, constipation, diarrhea, nausea and vomiting.  Genitourinary:  Negative for dysuria, frequency and hematuria.  Musculoskeletal:  Positive for arthralgias, back pain and gait problem. Negative for neck pain.  Skin:  Negative for rash.  Neurological:  Negative for dizziness, weakness, light-headedness, numbness and headaches.  Hematological:  Negative for adenopathy.  Psychiatric/Behavioral:  Positive for sleep disturbance. Negative for behavioral problems and dysphoric mood. The patient is nervous/anxious.   Per HPI unless specifically indicated above      Objective:    BP 139/77   Pulse 60   Ht _0  (1.803 m)   Wt 207 lb 12.8 oz (94.3 kg)   SpO2 98%   BMI 28.98 kg/m   Wt Readings from Last 3 Encounters:  08/13/21 207 lb 12.8 oz (94.3 kg)  04/18/21 203 lb (92.1 kg)  01/28/21 205 lb 3.2 oz (93.1 kg)    Physical Exam Vitals and nursing note reviewed.  Constitutional:      General: He is not in acute distress.    Appearance: He is well-developed. He is not diaphoretic.      Comments: Well-appearing, comfortable, cooperative  HENT:     Head: Normocephalic and atraumatic.     Ears:     Comments: Hearing aid R ear Eyes:     General:        Right eye: No discharge.        Left eye: No discharge.     Conjunctiva/sclera: Conjunctivae normal.     Pupils: Pupils are equal, round, and reactive to light.  Neck:     Thyroid: No thyromegaly.  Cardiovascular:     Rate and Rhythm: Normal rate and regular rhythm.     Pulses: Normal pulses.     Heart sounds: Normal heart sounds. No murmur heard. Pulmonary:     Effort: Pulmonary effort is normal. No respiratory distress.     Breath sounds: Normal breath sounds. No wheezing or rales.  Abdominal:     General: Bowel sounds are normal. There is no distension.     Palpations: Abdomen is soft. There is no mass.     Tenderness: There is no abdominal tenderness.  Musculoskeletal:        General: No tenderness. Normal range of motion.     Cervical back: Normal range of motion and neck supple.     Comments: Upper / Lower Extremities: - Normal muscle tone, strength bilateral upper extremities 5/5, lower extremities 5/5  Lymphadenopathy:     Cervical: No cervical adenopathy.  Skin:    General: Skin is warm and dry.     Findings: No erythema or rash.  Neurological:     Mental Status: He is alert and oriented to person, place, and time.     Comments: Distal sensation intact to light touch all extremities  Psychiatric:        Mood and Affect: Mood normal.        Behavior: Behavior normal.        Thought Content: Thought content normal.     Comments: Well groomed, good eye contact, normal speech and thoughts     Results for orders placed or performed in visit on 08/05/21  PSA  Result Value Ref Range   PSA 3.44 < OR = 4.00 ng/mL  Lipid panel  Result Value  Ref Range   Cholesterol 183 <200 mg/dL   HDL 46 > OR = 40 mg/dL   Triglycerides 144 <150 mg/dL   LDL Cholesterol (Calc) 111 (H) mg/dL (calc)   Total CHOL/HDL  Ratio 4.0 <5.0 (calc)   Non-HDL Cholesterol (Calc) 137 (H) <130 mg/dL (calc)  COMPLETE METABOLIC PANEL WITH GFR  Result Value Ref Range   Glucose, Bld 107 (H) 65 - 99 mg/dL   BUN 23 7 - 25 mg/dL   Creat 1.02 0.70 - 1.28 mg/dL   eGFR 77 > OR = 60 mL/min/1.63m   BUN/Creatinine Ratio NOT APPLICABLE 6 - 22 (calc)   Sodium 141 135 - 146 mmol/L   Potassium 4.4 3.5 - 5.3 mmol/L   Chloride 106 98 - 110 mmol/L   CO2 27 20 - 32 mmol/L   Calcium 9.9 8.6 - 10.3 mg/dL   Total Protein 7.4 6.1 - 8.1 g/dL   Albumin 4.6 3.6 - 5.1 g/dL   Globulin 2.8 1.9 - 3.7 g/dL (calc)   AG Ratio 1.6 1.0 - 2.5 (calc)   Total Bilirubin 0.6 0.2 - 1.2 mg/dL   Alkaline phosphatase (APISO) 67 35 - 144 U/L   AST 16 10 - 35 U/L   ALT 11 9 - 46 U/L  CBC with Differential/Platelet  Result Value Ref Range   WBC 4.7 3.8 - 10.8 Thousand/uL   RBC 4.49 4.20 - 5.80 Million/uL   Hemoglobin 13.7 13.2 - 17.1 g/dL   HCT 41.3 38.5 - 50.0 %   MCV 92.0 80.0 - 100.0 fL   MCH 30.5 27.0 - 33.0 pg   MCHC 33.2 32.0 - 36.0 g/dL   RDW 13.3 11.0 - 15.0 %   Platelets 167 140 - 400 Thousand/uL   MPV 11.1 7.5 - 12.5 fL   Neutro Abs 2,825 1,500 - 7,800 cells/uL   Lymphs Abs 1,114 850 - 3,900 cells/uL   Absolute Monocytes 508 200 - 950 cells/uL   Eosinophils Absolute 193 15 - 500 cells/uL   Basophils Absolute 61 0 - 200 cells/uL   Neutrophils Relative % 60.1 %   Total Lymphocyte 23.7 %   Monocytes Relative 10.8 %   Eosinophils Relative 4.1 %   Basophils Relative 1.3 %  Hemoglobin A1c  Result Value Ref Range   Hgb A1c MFr Bld 6.2 (H) <5.7 % of total Hgb   Mean Plasma Glucose 131 mg/dL   eAG (mmol/L) 7.3 mmol/L      Assessment & Plan:   Problem List Items Addressed This Visit     Thoracic and lumbosacral neuritis   Primary osteoarthritis of right hip   Pre-diabetes - Primary    Significantly improved A1c down to 6.2 from prior range >6.4 in past Limited lifestyle currently Concern with HTN, HLD, CAD  Plan:  1. Take  Metformin XR 50680mdaily 2. Encourage improved lifestyle - keto / low carb diet, keep improving exercise      Relevant Medications   metFORMIN (GLUCOPHAGE-XR) 500 MG 24 hr tablet   Mixed hyperlipidemia    Controlled cholesterol on intermittent statin / zetia and lifestyle Last lipid panel 07/2021 With CAD  Plan: 1. Gradually increase statin - Rosuvastatin from 80m63m x week - Continue Zetia 46m38m Continue ASA 81mg56m secondary ASCVD risk reduction 3. Encourage improved lifestyle - low carb/cholesterol, reduce portion size, continue improving regular exercise      Relevant Medications   rosuvastatin (CRESTOR) 5 MG tablet (Start on 08/15/2021)   ezetimibe (ZETIA) 10 MG tablet  lisinopril (ZESTRIL) 2.5 MG tablet   carvedilol (COREG) 3.125 MG tablet   Iron deficiency anemia   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HCC)   Essential hypertension    Controlled HTN on low dose meds. Mild elevated SBP Complicated by CAD    Plan:  1. Continue current BP regimen Coreg 3.$RemoveBeforeD'125mg'[id number]$  BID, Lisinopril 2.$RemoveBeforeDEI'5mg'[id number]$  - adjust in future if elevated 2. Encourage improved lifestyle - low sodium diet, regular exercise 3. Continue monitor BP outside office, bring readings to next visit, if persistently >140/90 or new symptoms notify office sooner      Relevant Medications   rosuvastatin (CRESTOR) 5 MG tablet (Start on 08/15/2021)   ezetimibe (ZETIA) 10 MG tablet   lisinopril (ZESTRIL) 2.5 MG tablet   carvedilol (COREG) 3.125 MG tablet   DDD (degenerative disc disease), lumbosacral   Bilateral sciatica   Atherosclerosis of aorta (HCC)    Secondary vascular complication of Hyperlipidemia among other risk factors Known CAD On ASA ,Statin      Relevant Medications   rosuvastatin (CRESTOR) 5 MG tablet (Start on 08/15/2021)   ezetimibe (ZETIA) 10 MG tablet   lisinopril (ZESTRIL) 2.5 MG tablet   carvedilol (COREG) 3.125 MG tablet   Arteriosclerosis of coronary artery    Stable without anginal symptoms CAD s/p CABG  1997 Followed by Cardiology Dr Ubaldo Glassing On ASA, Rosuvastatin, Zetia      Relevant Medications   rosuvastatin (CRESTOR) 5 MG tablet (Start on 08/15/2021)   ezetimibe (ZETIA) 10 MG tablet   lisinopril (ZESTRIL) 2.5 MG tablet   carvedilol (COREG) 3.125 MG tablet   Other Visit Diagnoses     Hearing loss of right ear, unspecified hearing loss type           Updated Health Maintenance information Reviewed recent lab results with patient Encouraged improvement to lifestyle with diet and exercise Goal of weight loss    Meds ordered this encounter  Medications   rosuvastatin (CRESTOR) 5 MG tablet    Sig: Take 1 tablet (5 mg total) by mouth 2 (two) times a week.    Dispense:  24 tablet    Refill:  3    Dose increase - please update rx on file.   ezetimibe (ZETIA) 10 MG tablet    Sig: Take 1 tablet (10 mg total) by mouth at bedtime.    Dispense:  90 tablet    Refill:  3    Adding refills on file, do not send if not needed, should have already been filled recently   lisinopril (ZESTRIL) 2.5 MG tablet    Sig: Take 1 tablet (2.5 mg total) by mouth daily.    Dispense:  90 tablet    Refill:  3    Adding refills on file, do not send if not needed, should have already been filled recently   carvedilol (COREG) 3.125 MG tablet    Sig: Take 1 tablet (3.125 mg total) by mouth 2 (two) times daily with a meal.    Dispense:  180 tablet    Refill:  3    Adding refills on file, do not send if not needed, should have already been filled recently   metFORMIN (GLUCOPHAGE-XR) 500 MG 24 hr tablet    Sig: Take 1 tablet (500 mg total) by mouth daily with breakfast.    Dispense:  90 tablet    Refill:  3    Adding refills on file, do not send if not needed, should have already been filled recently       Follow  up plan: Return in about 6 months (around 02/10/2022) for 6 month PreDM A1c, chart updates.  [name], [facility] 08/13/2021, 8:07  AM

## 2021-08-13 NOTE — Assessment & Plan Note (Signed)
Controlled cholesterol on intermittent statin / zetia and lifestyle Last lipid panel 07/2021 With CAD  Plan: 1. Gradually increase statin - Rosuvastatin from 5mg  2 x week - Continue Zetia 10mg  2. Continue ASA 81mg  for secondary ASCVD risk reduction 3. Encourage improved lifestyle - low carb/cholesterol, reduce portion size, continue improving regular exercise

## 2021-08-13 NOTE — Patient Instructions (Addendum)
Thank you for coming to the office today.  Chart updated. Meds refilled  Please schedule a Follow-up Appointment to: Return in about 6 months (around 02/10/2022) for 6 month PreDM A1c, chart updates.  If you have any other questions or concerns, please feel free to call the office or send a message through [place]. You may also schedule an earlier appointment if necessary.  Additionally, you may be receiving a survey about your experience at our office within a few days to 1 week by e-mail or mail. We value your feedback.  [name], DO [place]

## 2021-08-13 NOTE — Assessment & Plan Note (Signed)
Controlled HTN on low dose meds. Mild elevated SBP Complicated by CAD    Plan:  1. Continue current BP regimen Coreg 3.125mg  BID, Lisinopril 2.5mg  - adjust in future if elevated 2. Encourage improved lifestyle - low sodium diet, regular exercise 3. Continue monitor BP outside office, bring readings to next visit, if persistently >140/90 or new symptoms notify office sooner

## 2021-08-13 NOTE — Assessment & Plan Note (Signed)
Stable without anginal symptoms CAD s/p CABG 1997 Followed by Cardiology Dr Ubaldo Glassing On ASA, Rosuvastatin, Zetia

## 2021-08-13 NOTE — Telephone Encounter (Signed)
Ordered by provider today.

## 2021-08-13 NOTE — Assessment & Plan Note (Signed)
Secondary vascular complication of Hyperlipidemia among other risk factors Known CAD On ASA ,Statin

## 2021-08-13 NOTE — Assessment & Plan Note (Signed)
Significantly improved A1c down to 6.2 from prior range >6.4 in past Limited lifestyle currently Concern with HTN, HLD, CAD  Plan:  1. Take Metformin XR 500mg  daily 2. Encourage improved lifestyle - keto / low carb diet, keep improving exercise

## 2021-08-14 DIAGNOSIS — D509 Iron deficiency anemia, unspecified: Secondary | ICD-10-CM | POA: Diagnosis not present

## 2021-08-14 DIAGNOSIS — K2970 Gastritis, unspecified, without bleeding: Secondary | ICD-10-CM | POA: Diagnosis not present

## 2021-09-10 DIAGNOSIS — Z8719 Personal history of other diseases of the digestive system: Secondary | ICD-10-CM | POA: Diagnosis not present

## 2021-09-10 DIAGNOSIS — K2101 Gastro-esophageal reflux disease with esophagitis, with bleeding: Secondary | ICD-10-CM | POA: Diagnosis not present

## 2021-09-10 DIAGNOSIS — K31A12 Gastric intestinal metaplasia without dysplasia, involving the body (corpus): Secondary | ICD-10-CM | POA: Diagnosis not present

## 2021-09-10 DIAGNOSIS — K2950 Unspecified chronic gastritis without bleeding: Secondary | ICD-10-CM | POA: Diagnosis not present

## 2021-09-10 DIAGNOSIS — K3189 Other diseases of stomach and duodenum: Secondary | ICD-10-CM | POA: Diagnosis not present

## 2021-09-10 DIAGNOSIS — K2970 Gastritis, unspecified, without bleeding: Secondary | ICD-10-CM | POA: Diagnosis not present

## 2021-09-10 DIAGNOSIS — K2100 Gastro-esophageal reflux disease with esophagitis, without bleeding: Secondary | ICD-10-CM | POA: Diagnosis not present

## 2021-09-18 ENCOUNTER — Encounter: Payer: Self-pay | Admitting: Internal Medicine

## 2021-09-18 ENCOUNTER — Encounter: Payer: Self-pay | Admitting: Family Medicine

## 2021-09-19 ENCOUNTER — Other Ambulatory Visit: Payer: Self-pay

## 2021-10-02 ENCOUNTER — Ambulatory Visit (INDEPENDENT_AMBULATORY_CARE_PROVIDER_SITE_OTHER): Payer: Medicare Other | Admitting: Podiatry

## 2021-10-02 ENCOUNTER — Encounter: Payer: Self-pay | Admitting: Podiatry

## 2021-10-02 ENCOUNTER — Other Ambulatory Visit: Payer: Self-pay

## 2021-10-02 DIAGNOSIS — B351 Tinea unguium: Secondary | ICD-10-CM | POA: Diagnosis not present

## 2021-10-02 MED ORDER — TERBINAFINE HCL 250 MG PO TABS: 250.0000 mg | ORAL_TABLET | Freq: Every day | ORAL | 0 refills | Status: AC

## 2021-10-02 NOTE — Progress Notes (Signed)
  Subjective:  Patient ID: Corey Daniel, male    DOB: January 09, 1946,  MRN: [id number]  Chief Complaint  Patient presents with   Nail Problem    "They looking pretty good.  I need them trimmed."    75 y.o. male returns for follow-up with the above complaint. History confirmed with patient.  He is doing well he notes improvement  Objective:  Physical Exam: warm, good capillary refill, no trophic changes or ulcerative lesions, normal DP and PT pulses and normal sensory exam. Left Foot: Medial border of hallux nail is ingrowing and has yellow and brown discoloration, third and fifth toes have improved Right Foot: Mycotic hallux nail with yellow-brown discoloration, subungual debris,        [facility] pathology results with onychomycosis, moderate fungal growth, species of Trichophyton and Pseudomonas  LFTs normal  Assessment:   1. Onychomycosis       Plan:  Patient was evaluated and treated and all questions answered.   Doing well has had improvement with terbinafine and Penlac.  Sent him refills for both of Lamisil (he still has plenty of Penlac) I think we should do another course.  Debrided the nails to a comfortable level.    No follow-ups on file.

## 2021-10-09 ENCOUNTER — Encounter: Payer: Medicare Other | Admitting: Podiatry

## 2021-10-14 ENCOUNTER — Encounter: Payer: Self-pay | Admitting: Family Medicine

## 2021-10-24 ENCOUNTER — Other Ambulatory Visit: Payer: Self-pay

## 2021-10-24 ENCOUNTER — Inpatient Hospital Stay (HOSPITAL_BASED_OUTPATIENT_CLINIC_OR_DEPARTMENT_OTHER): Payer: Medicare Other | Admitting: Internal Medicine

## 2021-10-24 ENCOUNTER — Inpatient Hospital Stay: Payer: Medicare Other | Attending: Internal Medicine

## 2021-10-24 ENCOUNTER — Encounter: Payer: Self-pay | Admitting: Internal Medicine

## 2021-10-24 DIAGNOSIS — I2510 Atherosclerotic heart disease of native coronary artery without angina pectoris: Secondary | ICD-10-CM | POA: Insufficient documentation

## 2021-10-24 DIAGNOSIS — Z7984 Long term (current) use of oral hypoglycemic drugs: Secondary | ICD-10-CM | POA: Insufficient documentation

## 2021-10-24 DIAGNOSIS — C8599 Non-Hodgkin lymphoma, unspecified, extranodal and solid organ sites: Secondary | ICD-10-CM

## 2021-10-24 DIAGNOSIS — I252 Old myocardial infarction: Secondary | ICD-10-CM | POA: Diagnosis not present

## 2021-10-24 DIAGNOSIS — Z9221 Personal history of antineoplastic chemotherapy: Secondary | ICD-10-CM | POA: Insufficient documentation

## 2021-10-24 DIAGNOSIS — C8330 Diffuse large B-cell lymphoma, unspecified site: Secondary | ICD-10-CM | POA: Insufficient documentation

## 2021-10-24 DIAGNOSIS — E785 Hyperlipidemia, unspecified: Secondary | ICD-10-CM | POA: Insufficient documentation

## 2021-10-24 DIAGNOSIS — Z7982 Long term (current) use of aspirin: Secondary | ICD-10-CM | POA: Insufficient documentation

## 2021-10-24 DIAGNOSIS — Z87891 Personal history of nicotine dependence: Secondary | ICD-10-CM | POA: Insufficient documentation

## 2021-10-24 DIAGNOSIS — Z923 Personal history of irradiation: Secondary | ICD-10-CM | POA: Insufficient documentation

## 2021-10-24 DIAGNOSIS — I10 Essential (primary) hypertension: Secondary | ICD-10-CM | POA: Diagnosis not present

## 2021-10-24 DIAGNOSIS — D509 Iron deficiency anemia, unspecified: Secondary | ICD-10-CM | POA: Insufficient documentation

## 2021-10-24 DIAGNOSIS — G8929 Other chronic pain: Secondary | ICD-10-CM | POA: Diagnosis not present

## 2021-10-24 DIAGNOSIS — Z79899 Other long term (current) drug therapy: Secondary | ICD-10-CM | POA: Diagnosis not present

## 2021-10-24 DIAGNOSIS — M549 Dorsalgia, unspecified: Secondary | ICD-10-CM | POA: Insufficient documentation

## 2021-10-24 DIAGNOSIS — D696 Thrombocytopenia, unspecified: Secondary | ICD-10-CM | POA: Diagnosis not present

## 2021-10-24 LAB — CBC WITH DIFFERENTIAL/PLATELET
Abs Immature Granulocytes: 0.02 10*3/uL (ref 0.00–0.07)
Basophils Absolute: 0.1 10*3/uL (ref 0.0–0.1)
Basophils Relative: 1 %
Eosinophils Absolute: 0.2 10*3/uL (ref 0.0–0.5)
Eosinophils Relative: 4 %
HCT: 39.9 % (ref 39.0–52.0)
Hemoglobin: 13.7 g/dL (ref 13.0–17.0)
Immature Granulocytes: 0 %
Lymphocytes Relative: 24 %
Lymphs Abs: 1.2 10*3/uL (ref 0.7–4.0)
MCH: 30.9 pg (ref 26.0–34.0)
MCHC: 34.3 g/dL (ref 30.0–36.0)
MCV: 90.1 fL (ref 80.0–100.0)
Monocytes Absolute: 0.6 10*3/uL (ref 0.1–1.0)
Monocytes Relative: 12 %
Neutro Abs: 3.1 10*3/uL (ref 1.7–7.7)
Neutrophils Relative %: 59 %
Platelets: 148 10*3/uL — ABNORMAL LOW (ref 150–400)
RBC: 4.43 MIL/uL (ref 4.22–5.81)
RDW: 13 % (ref 11.5–15.5)
WBC: 5.2 10*3/uL (ref 4.0–10.5)
nRBC: 0 % (ref 0.0–0.2)

## 2021-10-24 LAB — COMPREHENSIVE METABOLIC PANEL
ALT: 15 U/L (ref 0–44)
AST: 20 U/L (ref 15–41)
Albumin: 4.1 g/dL (ref 3.5–5.0)
Alkaline Phosphatase: 59 U/L (ref 38–126)
Anion gap: 10 (ref 5–15)
BUN: 17 mg/dL (ref 8–23)
CO2: 26 mmol/L (ref 22–32)
Calcium: 9.1 mg/dL (ref 8.9–10.3)
Chloride: 103 mmol/L (ref 98–111)
Creatinine, Ser: 1.27 mg/dL — ABNORMAL HIGH (ref 0.61–1.24)
GFR, Estimated: 59 mL/min — ABNORMAL LOW (ref >60)
Glucose, Bld: 119 mg/dL — ABNORMAL HIGH (ref 70–99)
Potassium: 4.5 mmol/L (ref 3.5–5.1)
Sodium: 139 mmol/L (ref 135–145)
Total Bilirubin: 0.7 mg/dL (ref 0.3–1.2)
Total Protein: 7.1 g/dL (ref 6.5–8.1)

## 2021-10-24 LAB — LACTATE DEHYDROGENASE: LDH: 130 U/L (ref 98–192)

## 2021-10-24 NOTE — Progress Notes (Signed)
Pt in for follow up, continues to have some right pain but otherwise doing well.

## 2021-10-24 NOTE — Progress Notes (Signed)
[facility] OFFICE PROGRESS NOTE  Patient Care Team: Olin Hauser, DO as PCP - General (Family Medicine)   Cancer Staging  No matching staging information was found for the patient.   Oncology History Overview Note  # AUG 2018- DIFFUSE LARGE B CELL LYMPHOMA STOMACH-STAGE IE [BMBx-NEG]Fundus ;CD-20 pos;variable- bcl6/mum-?? GCB vs. ABC subtype;  [EGD for IDA; Dr.Hung; [place]]- NEG for FISH for;myc; bcl-6; bcl-2;11:14; MALT [8q21];NEG for CD-10; ki-67-70%; SEP PET- uptake in stomach. Hepatitis panel-NEG; BMBx-NEG  # SEP 25th 2018- R-CHOP x3 ; RT [until jan 3rd 2019]; PET Feb 2019- CR; s/p EGD- NEG [March 2019]; EGD/colo [10 polyps][2021-July]; EGD- in 2022; colo-2024.   # AUG 2018- H.pylori positive/stomach body.   # IDA-resolved  # CAD [s/p CABG; Dr.fath];MUGA scan [sep 2018]- 56.5%; port explanted.  ---------------------------------------------------------------   DIAGNOSIS: [ aug 2018]- DLBCL OD STOMACH  STAGE: IE ; GOALS: curative  CURRENT/MOST RECENT THERAPY;  surveillance    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[place])      INTERVAL HISTORY:  Corey Daniel 75 y.o.  male pleasant patient above history of diffuse large B-cell lymphoma of the stomach is here for follow-up.  Patient had EGD-in the interim evidence of lymphoma.  Patient denies any lumps or bumps.  Appetite is good no weight loss.  Continues to have chronic back pain not any worse.  Review of Systems  Constitutional:  Negative for chills, diaphoresis, fever, malaise/fatigue and weight loss.  HENT:  Negative for nosebleeds and sore throat.   Eyes:  Negative for double vision.  Respiratory:  Negative for cough, hemoptysis, sputum production, shortness of breath and wheezing.   Cardiovascular:  Negative for chest pain, palpitations, orthopnea and leg swelling.  Gastrointestinal:  Negative for abdominal pain, blood in stool, constipation, diarrhea, heartburn, melena, nausea and vomiting.  Genitourinary:   Negative for dysuria, frequency and urgency.  Musculoskeletal:  Positive for back pain. Negative for joint pain.  Skin: Negative.  Negative for itching and rash.  Neurological:  Negative for dizziness, tingling, focal weakness, weakness and headaches.  Endo/Heme/Allergies:  Does not bruise/bleed easily.  Psychiatric/Behavioral:  Negative for depression. The patient is not nervous/anxious and does not have insomnia.      PAST MEDICAL HISTORY :  Past Medical History:  Diagnosis Date   Arthritis    ASCVD (arteriosclerotic cardiovascular disease)    Back pain    with radiation   Cancer (HCC)    lymphoma   Coronary artery disease    Hyperlipidemia    Hypertension    IDA (iron deficiency anemia)    Lower leg pain    Myocardial infarction ([place])    Prediabetes     PAST SURGICAL HISTORY :   Past Surgical History:  Procedure Laterality Date   AUTOGRAFT STRUCTURAL ICBG OBTAINED SEPARATE INCISION FOR SPINE SURGERY   Bilateral 07/15/2013   BACK SURGERY     CATARACT EXTRACTION  2010   COLONOSCOPY  06/2017   CORONARY ARTERY BYPASS GRAFT     IR IMAGING GUIDED PORT INSERTION  07/31/2017   IR REMOVAL TUN ACCESS W/ PORT W/O FL MOD SED  02/12/2018   LAMINECTOMY LUMBAR EXCISON/EVACUATION EXTRADURAL INTRASPINAL LESION Bilateral 07/15/2013      LAMINOTOMY REEXPLORATION POSTERIOR LUMBAR W/NERVE DECOMP & DISCECTOMY Bilateral 07/15/2013      UPPER GI ENDOSCOPY  06/2017    FAMILY HISTORY :   Family History  Problem Relation Age of Onset   Heart disease Mother    Coarctation of the aorta Brother  SOCIAL HISTORY:   Social History   Tobacco Use   Smoking status: Former    Packs/day: 0.50    Years: 30.00    Pack years: 15.00    Types: Cigarettes    Quit date: 54    Years since quitting: 39.9   Smokeless tobacco: Never  Vaping Use   Vaping Use: Never used  Substance Use Topics   Alcohol use: No   Drug use: No    ALLERGIES:  is allergic to tramadol.  MEDICATIONS:  Current  Outpatient Medications  Medication Sig Dispense Refill   aspirin 81 MG chewable tablet Chew 81 mg by mouth daily.      carvedilol (COREG) 3.125 MG tablet Take 1 tablet (3.125 mg total) by mouth 2 (two) times daily with a meal. 180 tablet 3   Cholecalciferol (VITAMIN D3) 2000 UNITS capsule Take 2,000 Units by mouth daily.      ciclopirox (PENLAC) 8 % solution Apply topically at bedtime. Apply over nail and surrounding skin. Apply daily over previous coat. After seven (7) days, may remove with alcohol and continue cycle. 6.6 mL 2   Coenzyme Q10 100 MG capsule Take by mouth.     ezetimibe (ZETIA) 10 MG tablet Take 1 tablet (10 mg total) by mouth at bedtime. 90 tablet 3   Fish Oil-Cholecalciferol (FISH OIL + D3) 1000-1000 MG-UNIT CAPS Take by mouth.     lisinopril (ZESTRIL) 2.5 MG tablet Take 1 tablet (2.5 mg total) by mouth daily. 90 tablet 3   metFORMIN (GLUCOPHAGE-XR) 500 MG 24 hr tablet Take 1 tablet (500 mg total) by mouth daily with breakfast. 90 tablet 3   rosuvastatin (CRESTOR) 5 MG tablet Take 1 tablet (5 mg total) by mouth 2 (two) times a week. 24 tablet 3   terbinafine (LAMISIL) 250 MG tablet Take 1 tablet (250 mg total) by mouth daily. 90 tablet 0   omeprazole (PRILOSEC) 40 MG capsule Take 40 mg by mouth daily.     No current facility-administered medications for this visit.    PHYSICAL EXAMINATION: ECOG PERFORMANCE STATUS: 0 - Asymptomatic  BP 132/84 (BP Location: Left Arm, Patient Position: Sitting)   Pulse 65   Temp 97.6 F (36.4 C) (Tympanic)   Resp 18   Wt 218 lb (98.9 kg)   SpO2 98%   BMI 30.40 kg/m   Filed Weights   10/24/21 1039  Weight: 218 lb (98.9 kg)   Physical Exam HENT:     Head: Normocephalic and atraumatic.     Mouth/Throat:     Pharynx: No oropharyngeal exudate.  Eyes:     Pupils: Pupils are equal, round, and reactive to light.  Cardiovascular:     Rate and Rhythm: Normal rate and regular rhythm.  Pulmonary:     Effort: No respiratory distress.      Breath sounds: No wheezing.  Abdominal:     General: Bowel sounds are normal. There is no distension.     Palpations: Abdomen is soft. There is no mass.     Tenderness: There is no abdominal tenderness. There is no guarding or rebound.  Musculoskeletal:        General: No tenderness. Normal range of motion.     Cervical back: Normal range of motion and neck supple.  Skin:    General: Skin is warm.  Neurological:     Mental Status: He is alert and oriented to person, place, and time.  Psychiatric:        Mood and Affect:  Affect normal.       LABORATORY DATA:  I have reviewed the data as listed    Component Value Date/Time   NA 139 10/24/2021 0957   NA 139 02/06/2016 0806   K 4.5 10/24/2021 0957   CL 103 10/24/2021 0957   CO2 26 10/24/2021 0957   GLUCOSE 119 (H) 10/24/2021 0957   BUN 17 10/24/2021 0957   BUN 18 02/06/2016 0806   CREATININE 1.27 (H) 10/24/2021 0957   CREATININE 1.02 08/06/2021 0753   CALCIUM 9.1 10/24/2021 0957   PROT 7.1 10/24/2021 0957   PROT 7.1 02/06/2016 0806   ALBUMIN 4.1 10/24/2021 0957   ALBUMIN 4.7 02/06/2016 0806   AST 20 10/24/2021 0957   ALT 15 10/24/2021 0957   ALKPHOS 59 10/24/2021 0957   BILITOT 0.7 10/24/2021 0957   BILITOT 0.5 02/06/2016 0806   GFRNONAA 59 (L) 10/24/2021 0957   GFRNONAA 65 07/24/2020 0759   GFRAA 75 07/24/2020 0759    No results found for: SPEP, UPEP  Lab Results  Component Value Date   WBC 5.2 10/24/2021   NEUTROABS 3.1 10/24/2021   HGB 13.7 10/24/2021   HCT 39.9 10/24/2021   MCV 90.1 10/24/2021   PLT 148 (L) 10/24/2021      Chemistry      Component Value Date/Time   NA 139 10/24/2021 0957   NA 139 02/06/2016 0806   K 4.5 10/24/2021 0957   CL 103 10/24/2021 0957   CO2 26 10/24/2021 0957   BUN 17 10/24/2021 0957   BUN 18 02/06/2016 0806   CREATININE 1.27 (H) 10/24/2021 0957   CREATININE 1.02 08/06/2021 0753      Component Value Date/Time   CALCIUM 9.1 10/24/2021 0957   ALKPHOS 59 10/24/2021  0957   AST 20 10/24/2021 0957   ALT 15 10/24/2021 0957   BILITOT 0.7 10/24/2021 0957   BILITOT 0.5 02/06/2016 0806       RADIOGRAPHIC STUDIES: I have personally reviewed the radiological images as listed and agreed with the findings in the report. No results found.   ASSESSMENT & PLAN:  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[place]) #Diffuse large B-cell lymphoma of the stomach; stage I E.  Status post chemotherapy followed by radiation; February 2019 PET scan CR. S/p  EGD in oct 2022. STABLE   # Clinically, no evidence of recurrence.  Continue surveillance every 6 months on a clinical basis without any imaging.   #Chronic back pain s/p back surgery- STABLE.    #COVID-: un-vaccinated.  #Labs reviewed including mild thrombocytopenia-platelets 148; intermittent.  Renal function creatinine 1.2 GFR 59; continue fluid intake.  # DISPOSITION:mychart  # follow up in 6 months- MD-labs-cbc/cmp/ldh; Dr.B    Orders Placed This Encounter  Procedures   CBC with Differential/Platelet    Standing Status:   Future    Standing Expiration Date:   10/24/2022   Comprehensive metabolic panel    Standing Status:   Future    Standing Expiration Date:   10/24/2022   Lactate dehydrogenase    Standing Status:   Future    Standing Expiration Date:   10/24/2022   All questions were answered. The patient knows to call the clinic with any problems, questions or concerns.      Cammie Sickle, MD 10/24/2021 1:18 PM

## 2021-10-24 NOTE — Assessment & Plan Note (Addendum)
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  Status post chemotherapy followed by radiation; February 2019 PET scan CR. S/p  EGD in oct 2022. STABLE   # Clinically, no evidence of recurrence.  Continue surveillance every 6 months on a clinical basis without any imaging.   #Chronic back pain s/p back surgery- STABLE.    #COVID-: un-vaccinated.  #Labs reviewed including mild thrombocytopenia-platelets 148; intermittent.  Renal function creatinine 1.2 GFR 59; continue fluid intake.  # DISPOSITION:mychart  # follow up in 6 months- MD-labs-cbc/cmp/ldh; Dr.B

## 2021-11-26 ENCOUNTER — Encounter: Payer: Self-pay | Admitting: Internal Medicine

## 2021-11-26 ENCOUNTER — Telehealth: Payer: Self-pay

## 2021-11-26 ENCOUNTER — Ambulatory Visit: Payer: Self-pay | Admitting: *Deleted

## 2021-11-26 ENCOUNTER — Other Ambulatory Visit: Payer: Self-pay

## 2021-11-26 DIAGNOSIS — U071 COVID-19: Secondary | ICD-10-CM | POA: Diagnosis not present

## 2021-11-26 DIAGNOSIS — Z20822 Contact with and (suspected) exposure to covid-19: Secondary | ICD-10-CM | POA: Diagnosis not present

## 2021-11-26 MED ORDER — PAXLOVID (150/100) 10 X 150 MG & 10 X 100MG PO TBPK
2.0000 | ORAL_TABLET | Freq: Two times a day (BID) | ORAL | 0 refills | Status: AC
  Filled 2021-11-26: qty 20, 5d supply, fill #0

## 2021-11-26 NOTE — Telephone Encounter (Signed)
Outpatient Pharmacy Oral COVID Treatment Note  I connected with Corey Daniel on 11/26/2021/4:54 PM by telephone and verified that I am speaking with the correct person using two identifiers.  I discussed the limitations, risks, security, and privacy concerns of performing an evaluation and management service by telephone and the availability of in person appointments via referral to a physician. The patient expressed understanding and agreed to proceed.  Pharmacy location: [place]  Diagnosis: COVID-19 infection  Purpose of visit: Discussion of potential use of Paxlovid, a new treatment for mild to moderate COVID-19 viral infection in non-hospitalized patients.  Subjective/Objective: Patient is a 76 y.o. male who is presenting with COVID 19 viral infection.  COVID 19 viral infection. Their symptoms began on 11/24/21 with cold symptoms/scratch throat.  The patient has confirmed COVID-19 via a rapid test at urgent care.   Past Medical History:  Diagnosis Date   Arthritis    ASCVD (arteriosclerotic cardiovascular disease)    Back pain    with radiation   Cancer (HCC)    lymphoma   Coronary artery disease    Hyperlipidemia    Hypertension    IDA (iron deficiency anemia)    Lower leg pain    Myocardial infarction (HCC)    Prediabetes      Allergies  Allergen Reactions   Tramadol Itching     Current Outpatient Medications:    aspirin 81 MG chewable tablet, Chew 81 mg by mouth daily. , Disp: , Rfl:    carvedilol (COREG) 3.125 MG tablet, Take 1 tablet (3.125 mg total) by mouth 2 (two) times daily with a meal., Disp: 180 tablet, Rfl: 3   Cholecalciferol (VITAMIN D3) 2000 UNITS capsule, Take 2,000 Units by mouth daily. , Disp: , Rfl:    ciclopirox (PENLAC) 8 % solution, Apply topically at bedtime. Apply over nail and surrounding skin. Apply daily over previous coat. After seven (7) days, may remove with alcohol and continue cycle., Disp: 6.6 mL, Rfl: 2    Coenzyme Q10 100 MG capsule, Take by mouth., Disp: , Rfl:    ezetimibe (ZETIA) 10 MG tablet, Take 1 tablet (10 mg total) by mouth at bedtime., Disp: 90 tablet, Rfl: 3   Fish Oil-Cholecalciferol (FISH OIL + D3) 1000-1000 MG-UNIT CAPS, Take by mouth., Disp: , Rfl:    lisinopril (ZESTRIL) 2.5 MG tablet, Take 1 tablet (2.5 mg total) by mouth daily., Disp: 90 tablet, Rfl: 3   metFORMIN (GLUCOPHAGE-XR) 500 MG 24 hr tablet, Take 1 tablet (500 mg total) by mouth daily with breakfast., Disp: 90 tablet, Rfl: 3   nirmatrelvir & ritonavir (PAXLOVID, 150/100,) 10 x 150 MG & 10 x 100MG TBPK, Take 2 tablets ( 1 nirmatrelvir and 1 ritonavir) by mouth 2 (two) times daily., Disp: 20 tablet, Rfl: 0   omeprazole (PRILOSEC) 40 MG capsule, Take 40 mg by mouth daily., Disp: , Rfl:    rosuvastatin (CRESTOR) 5 MG tablet, Take 1 tablet (5 mg total) by mouth 2 (two) times a week., Disp: 24 tablet, Rfl: 3   terbinafine (LAMISIL) 250 MG tablet, Take 1 tablet (250 mg total) by mouth daily., Disp: 90 tablet, Rfl: 0  Lab Monitoring: eGFR 59  Drug Interactions Noted: Rosuvastatin- pt will hold.  Plan:  This patient is a 76 y.o. male that meets the criteria for Emergency Use Authorization of Paxlovid. After reviewing the emergency use authorization with the patient, the patient agrees to receive Paxlovid.  Through FDA guidance and current [facility] standing order Paxlovid  will be prescribed to the patient.   Patient contacted for counseling on 11/26/21 and verbalized understanding.   Delivery or Pick-Up Date: 11/26/21  Follow up instructions:    Take prescription BID x 5 days as directed Counseling was provided by pharmacist. Reach out to pharmacist with follow up questions For concerns regarding further COVID symptoms please follow up with your PCP or urgent care For urgent or life-threatening issues, seek care at your local emergency department   [place] 11/26/2021, 4:54 PM [facility]  Pharmacist Phone# [phone]

## 2021-11-26 NOTE — Telephone Encounter (Signed)
Pt called in that he went to [facility] today and tested positive for Covid.  It was recommended he call his PCP and start on Paxlovid.  His symptoms started Sunday afternoon with cold symptoms and a sore throat.  I attempted to return his call.   Left a voicemail to call us back.

## 2021-11-26 NOTE — Telephone Encounter (Signed)
Since patient was already evaluated at an Urgent Care I would suggest he calls [facility] to get Paxlovid prescribed.  I don't have availability today, and cannot call in the medicine without at least discussing with patient.  Please contact patient and notify them that they can proceed w/ speaking directly to pharmacist through [facility] to determine if they meet criteria to start the anti-viral medication. No appointment should be necessary. We don't have any available apt today regardless.  They should call a [facility] and state that her physician has referred them to speak to them for COVID Anti viral treatment = Paxlovid.  The pharmacist will discuss the criteria and review symptoms and if meets criteria, they can prescribe it for them.  [facility] [place], [address] [place], [place] [address]  [facility] [place], [address] [place] [address] [place], [place], [place] [address]  [name], [facility] 07/17/2021, 10:04 AM

## 2021-11-26 NOTE — Telephone Encounter (Signed)
Chief Complaint: requesting medication due to positive for covid  Symptoms: sore throat, mild cough Frequency: sx started Sunday 11/24/21 Pertinent Negatives: Patient denies difficulty breathing, fever. Disposition: [] ED /[] Urgent Care (no appt availability in office) / [] Appointment(In office/virtual)/ []  [facility]/ [] Home Care/ [] Refused Recommended Disposition /[] [facility]/ [x]  Follow-up with PCP Additional Notes:  Patient seen today and tested at [facility]  tested positive for covid and recommended to call PCP for antiviral medication . Please send medication to [facility] in   if PCP gives paxlovid . Patient  requesting a call back .      Reason for Disposition  [1] HIGH RISK for severe COVID complications (e.g., weak immune system, age > 71 years, obesity with BMI > 25, pregnant, chronic lung disease or other chronic medical condition) AND [2] COVID symptoms (e.g., cough, fever)  (Exceptions: Already seen by PCP and no new or worsening symptoms.)  Answer Assessment - Initial Assessment Questions 1. COVID-19 DIAGNOSIS: "Who made your COVID-19 diagnosis?" "Was it confirmed by a positive lab test or self-test?" If not diagnosed by a doctor (or NP/PA), ask "Are there lots of cases (community spread) where you live?" Note: See public health department website, if unsure.     [facility] positive covid  2. COVID-19 EXPOSURE: "Was there any known exposure to COVID before the symptoms began?" CDC Definition of close contact: within 6 feet (2 meters) for a total of 15 minutes or more over a 24-hour period.      na 3. ONSET: "When did the COVID-19 symptoms start?"      Sunday 11/24/21 4. WORST SYMPTOM: "What is your worst symptom?" (e.g., cough, fever, shortness of breath, muscle aches)     Sore throat , mild cough  5. COUGH: "Do you have a cough?" If Yes, ask: "How bad is the cough?"       Yes mild  6. FEVER: "Do you have a  fever?" If Yes, ask: "What is your temperature, how was it measured, and when did it start?"     na 7. RESPIRATORY STATUS: "Describe your breathing?" (e.g., shortness of breath, wheezing, unable to speak)      na 8. BETTER-SAME-WORSE: "Are you getting better, staying the same or getting worse compared to yesterday?"  If getting worse, ask, "In what way?"     Better  9. HIGH RISK DISEASE: "Do you have any chronic medical problems?" (e.g., asthma, heart or lung disease, weak immune system, obesity, etc.)     Hx heart hx . Pre diabetic  10. VACCINE: "Have you had the COVID-19 vaccine?" If Yes, ask: "Which one, how many shots, when did you get it?"       Na  11. BOOSTER: "Have you received your COVID-19 booster?" If Yes, ask: "Which one and when did you get it?"       na 12. PREGNANCY: "Is there any chance you are pregnant?" "When was your last menstrual period?"       na 13. OTHER SYMPTOMS: "Do you have any other symptoms?"  (e.g., chills, fatigue, headache, loss of smell or taste, muscle pain, sore throat)       Sore throat mild cough  14. O2 SATURATION MONITOR:  "Do you use an oxygen saturation monitor (pulse oximeter) at home?" If Yes, ask "What is your reading (oxygen level) today?" "What is your usual oxygen saturation reading?" (e.g., 95%)       na  Protocols used: Coronavirus (  [id number]) Diagnosed or Suspected-A-AH

## 2021-12-02 DIAGNOSIS — Z20822 Contact with and (suspected) exposure to covid-19: Secondary | ICD-10-CM | POA: Diagnosis not present

## 2021-12-06 ENCOUNTER — Ambulatory Visit (INDEPENDENT_AMBULATORY_CARE_PROVIDER_SITE_OTHER): Payer: Medicare Other

## 2021-12-06 DIAGNOSIS — Z0000 Encounter for general adult medical examination without abnormal findings: Secondary | ICD-10-CM | POA: Diagnosis not present

## 2021-12-06 NOTE — Progress Notes (Signed)
Virtual Visit via Telephone Note  I connected with  Corey Daniel on 12/06/21 at 11:40 AM EST by telephone and verified that I am speaking with the correct person using two identifiers.  Location: Patient: home Provider: [facility] Persons participating in the virtual visit: [place]   I discussed the limitations, risks, security and privacy concerns of performing an evaluation and management service by telephone and the availability of in person appointments. The patient expressed understanding and agreed to proceed.  Interactive audio and video telecommunications were attempted between this nurse and patient, however failed, due to patient having technical difficulties OR patient did not have access to video capability.  We continued and completed visit with audio only.  Some vital signs may be absent or patient reported.   Dionisio David, LPN  Subjective:   Corey Daniel is a 76 y.o. male who presents for Medicare Annual/Subsequent preventive examination.  Review of Systems           Objective:    There were no vitals filed for this visit. There is no height or weight on file to calculate BMI.  Advanced Directives 10/24/2021 04/18/2021 12/04/2020 08/01/2020 08/01/2020 04/19/2020 11/29/2019  Does Patient Have a Medical Advance Directive? Yes Yes No Yes Yes Yes Yes  Type of Paramedic of [place];Living will [place];Living will - [place];Living will - [place];Living will Living will;Healthcare Power of Attorney  Does patient want to make changes to medical advance directive? - - - - - - -  Copy of Healthcare Power of Attorney in Chart? - - - No - copy requested - - No - copy requested  Would patient like information on creating a medical advance directive? - - - No - Patient declined - - -    Current Medications (verified) Outpatient Encounter Medications as of 12/06/2021  Medication  Sig   aspirin 81 MG chewable tablet Chew 81 mg by mouth daily.    carvedilol (COREG) 3.125 MG tablet Take 1 tablet (3.125 mg total) by mouth 2 (two) times daily with a meal.   Cholecalciferol (VITAMIN D3) 2000 UNITS capsule Take 2,000 Units by mouth daily.    ciclopirox (PENLAC) 8 % solution Apply topically at bedtime. Apply over nail and surrounding skin. Apply daily over previous coat. After seven (7) days, may remove with alcohol and continue cycle.   Coenzyme Q10 100 MG capsule Take by mouth.   ezetimibe (ZETIA) 10 MG tablet Take 1 tablet (10 mg total) by mouth at bedtime.   Fish Oil-Cholecalciferol (FISH OIL + D3) 1000-1000 MG-UNIT CAPS Take by mouth.   lisinopril (ZESTRIL) 2.5 MG tablet Take 1 tablet (2.5 mg total) by mouth daily.   metFORMIN (GLUCOPHAGE-XR) 500 MG 24 hr tablet Take 1 tablet (500 mg total) by mouth daily with breakfast.   nirmatrelvir & ritonavir (PAXLOVID, 150/100,) 10 x 150 MG & 10 x 100MG  TBPK Take 2 tablets ( 1 nirmatrelvir and 1 ritonavir) by mouth 2 (two) times daily.   omeprazole (PRILOSEC) 40 MG capsule Take 40 mg by mouth daily.   rosuvastatin (CRESTOR) 5 MG tablet Take 1 tablet (5 mg total) by mouth 2 (two) times a week.   terbinafine (LAMISIL) 250 MG tablet Take 1 tablet (250 mg total) by mouth daily.   No facility-administered encounter medications on file as of 12/06/2021.    Allergies (verified) Tramadol   History: Past Medical History:  Diagnosis Date   Arthritis  ASCVD (arteriosclerotic cardiovascular disease)    Back pain    with radiation   Cancer (HCC)    lymphoma   Coronary artery disease    Hyperlipidemia    Hypertension    IDA (iron deficiency anemia)    Lower leg pain    Myocardial infarction ([place])    Prediabetes    Past Surgical History:  Procedure Laterality Date   AUTOGRAFT STRUCTURAL ICBG OBTAINED SEPARATE INCISION FOR SPINE SURGERY   Bilateral 07/15/2013   BACK SURGERY     CATARACT EXTRACTION  2010   COLONOSCOPY  06/2017    CORONARY ARTERY BYPASS GRAFT     IR IMAGING GUIDED PORT INSERTION  07/31/2017   IR REMOVAL TUN ACCESS W/ PORT W/O FL MOD SED  02/12/2018   LAMINECTOMY LUMBAR EXCISON/EVACUATION EXTRADURAL INTRASPINAL LESION Bilateral 07/15/2013      LAMINOTOMY REEXPLORATION POSTERIOR LUMBAR W/NERVE DECOMP & DISCECTOMY Bilateral 07/15/2013      UPPER GI ENDOSCOPY  06/2017   Family History  Problem Relation Age of Onset   Heart disease Mother    Coarctation of the aorta Brother    Social History   Socioeconomic History   Marital status: Married    Spouse name: Not on file   Number of children: 1   Years of education: Not on file   Highest education level: High school graduate  Occupational History   Occupation: retired  Tobacco Use   Smoking status: Former    Packs/day: 0.50    Years: 30.00    Pack years: 15.00    Types: Cigarettes    Quit date: 1983    Years since quitting: 40.0   Smokeless tobacco: Never  Vaping Use   Vaping Use: Never used  Substance and Sexual Activity   Alcohol use: No   Drug use: No   Sexual activity: Yes  Other Topics Concern   Not on file  Social History Narrative   Not on file   Social Determinants of Health   Financial Resource Strain: Not on file  Food Insecurity: Not on file  Transportation Needs: Not on file  Physical Activity: Not on file  Stress: Not on file  Social Connections: Not on file    Tobacco Counseling Counseling given: Not Answered   Clinical Intake:  Pre-visit preparation completed: Yes  Pain : No/denies pain     Nutritional Risks: None Diabetes: No  How often do you need to have someone help you when you read instructions, pamphlets, or other written materials from your doctor or pharmacy?: 1 - Never  Diabetic?no  Interpreter Needed?: No  Information entered by :: Kirke Shaggy, LPN   Activities of Daily Living No flowsheet data found.  Patient Care Team: Olin Hauser, DO as PCP - General (Family  Medicine)  Indicate any recent Medical Services you may have received from other than [facility] providers in the past year (date may be approximate).     Assessment:   This is a routine wellness examination for Corey Daniel.  Hearing/Vision screen No results found.  Dietary issues and exercise activities discussed:     Goals Addressed   None    Depression Screen PHQ 2/9 Scores 12/04/2020 12/19/2019 11/29/2019 05/16/2019 02/01/2019 11/24/2018 10/27/2018  PHQ - 2 Score 0 0 0 0 0 0 0    Fall Risk Fall Risk  12/04/2020 12/19/2019 11/29/2019 05/16/2019 02/01/2019  Falls in the past year? 0 0 0 0 0  Number falls in past yr: - 0 0 - -  Comment - - - - -  Injury with Fall? - 0 0 - -  Risk for fall due to : Medication side effect - - - -  Follow up Falls evaluation completed;Education provided;Falls prevention discussed Falls evaluation completed - Falls evaluation completed Falls evaluation completed    FALL RISK PREVENTION PERTAINING TO THE HOME:  Any stairs in or around the home? No  If so, are there any without handrails? No  Home free of loose throw rugs in walkways, pet beds, electrical cords, etc? Yes  Adequate lighting in your home to reduce risk of falls? Yes   ASSISTIVE DEVICES UTILIZED TO PREVENT FALLS:  Life alert? No  Use of a cane, walker or w/c? No  Grab bars in the bathroom? Yes  Shower chair or bench in shower? Yes  Elevated toilet seat or a handicapped toilet? No   Cognitive Function:Normal cognitive status assessed by direct observation by this Nurse Health Advisor. No abnormalities found.       6CIT Screen 12/04/2020 11/24/2018 10/06/2017  What Year? 0 points 0 points 0 points  What month? 0 points 0 points 0 points  What time? 0 points 0 points 0 points  Count back from 20 0 points 0 points 0 points  Months in reverse 0 points 0 points 0 points  Repeat phrase 0 points 0 points 0 points  Total Score 0 0 0    Immunizations Immunization History  Administered Date(s)  Administered   Fluad Quad(high Dose 65+) 08/16/2019   Hepatitis B 03/17/2004   Influenza, High Dose Seasonal PF 08/08/2015, 08/07/2016, 07/22/2017, 10/27/2018   Influenza-Unspecified 08/29/2013, 09/28/2014, 08/08/2015, 08/07/2016   Pneumococcal Conjugate-13 09/28/2014   Pneumococcal Polysaccharide-23 08/07/2016   Tdap 04/17/2014    TDAP status: Up to date  Flu Vaccine status: Declined, Education has been provided regarding the importance of this vaccine but patient still declined. Advised may receive this vaccine at local pharmacy or Health Dept. Aware to provide a copy of the vaccination record if obtained from local pharmacy or Health Dept. Verbalized acceptance and understanding.  Pneumococcal vaccine status: Up to date  Covid-19 vaccine status: Declined, Education has been provided regarding the importance of this vaccine but patient still declined. Advised may receive this vaccine at local pharmacy or Health Dept.or vaccine clinic. Aware to provide a copy of the vaccination record if obtained from local pharmacy or Health Dept. Verbalized acceptance and understanding.  Qualifies for Shingles Vaccine? No   Zostavax completed No   Shingrix Completed?: No.    Education has been provided regarding the importance of this vaccine. Patient has been advised to call insurance company to determine out of pocket expense if they have not yet received this vaccine. Advised may also receive vaccine at local pharmacy or Health Dept. Verbalized acceptance and understanding.  Screening Tests Health Maintenance  Topic Date Due   COVID-19 Vaccine (1) Never done   Zoster Vaccines- Shingrix (1 of 2) Never done   INFLUENZA VACCINE  02/14/2022 (Originally 06/17/2021)   COLONOSCOPY (Pts 45-93yrs Insurance coverage will need to be confirmed)  08/29/2023   TETANUS/TDAP  04/17/2024   Pneumonia Vaccine 33+ Years old  Completed   Hepatitis C Screening  Completed   HPV VACCINES  Aged Out    Health  Maintenance  Health Maintenance Due  Topic Date Due   COVID-19 Vaccine (1) Never done   Zoster Vaccines- Shingrix (1 of 2) Never done    Colorectal cancer screening: Type of screening: Colonoscopy. Completed 06/23/17. Repeat  every 10 years (aged out)  Lung Cancer Screening: (Low Dose CT Chest recommended if Age 49-80 years, 30 pack-year currently smoking OR have quit w/in 15years.) does not qualify.   Additional Screening:  Hepatitis C Screening: does qualify; Completed 07/21/17  Vision Screening: Recommended annual ophthalmology exams for early detection of glaucoma and other disorders of the eye. Is the patient up to date with their annual eye exam?  No  Who is the provider or what is the name of the office in which the patient attends annual eye exams? none If pt is not established with a provider, would they like to be referred to a provider to establish care? No .   Dental Screening: Recommended annual dental exams for proper oral hygiene  Community Resource Referral / Chronic Care Management: CRR required this visit?  No   CCM required this visit?  No      Plan:     I have personally reviewed and noted the following in the patients chart:   Medical and social history Use of alcohol, tobacco or illicit drugs  Current medications and supplements including opioid prescriptions. Patient is not currently taking opioid prescriptions. Functional ability and status Nutritional status Physical activity Advanced directives List of other physicians Hospitalizations, surgeries, and ER visits in previous 12 months Vitals Screenings to include cognitive, depression, and falls Referrals and appointments  In addition, I have reviewed and discussed with patient certain preventive protocols, quality metrics, and best practice recommendations. A written personalized care plan for preventive services as well as general preventive health recommendations were provided to patient.      Dionisio David, LPN   4/31/5400   Nurse Notes: none

## 2021-12-06 NOTE — Patient Instructions (Signed)
Corey Daniel , Thank you for taking time to come for your Medicare Wellness Visit. I appreciate your ongoing commitment to your health goals. Please review the following plan we discussed and let me know if I can assist you in the future.   Screening recommendations/referrals: Colonoscopy: 06/23/2017 Recommended yearly ophthalmology/optometry visit for glaucoma screening and checkup Recommended yearly dental visit for hygiene and checkup  Vaccinations: Influenza vaccine: n/d Pneumococcal vaccine: 08/07/16 Tdap vaccine: 04/17/14 Shingles vaccine: n/d   Covid-19: n/d  Advanced directives: no  Conditions/risks identified: none  Next appointment: Follow up in one year for your annual wellness visit.   Preventive Care 65 Years and Older, Male Preventive care refers to lifestyle choices and visits with your health care provider that can promote health and wellness. What does preventive care include? A yearly physical exam. This is also called an annual well check. Dental exams once or twice a year. Routine eye exams. Ask your health care provider how often you should have your eyes checked. Personal lifestyle choices, including: Daily care of your teeth and gums. Regular physical activity. Eating a healthy diet. Avoiding tobacco and drug use. Limiting alcohol use. Practicing safe sex. Taking low doses of aspirin every day. Taking vitamin and mineral supplements as recommended by your health care provider. What happens during an annual well check? The services and screenings done by your health care provider during your annual well check will depend on your age, overall health, lifestyle risk factors, and family history of disease. Counseling  Your health care provider may ask you questions about your: Alcohol use. Tobacco use. Drug use. Emotional well-being. Home and relationship well-being. Sexual activity. Eating habits. History of falls. Memory and ability to understand  (cognition). Work and work Statistician. Screening  You may have the following tests or measurements: Height, weight, and BMI. Blood pressure. Lipid and cholesterol levels. These may be checked every 5 years, or more frequently if you are over 66 years old. Skin check. Lung cancer screening. You may have this screening every year starting at age 49 if you have a 30-pack-year history of smoking and currently smoke or have quit within the past 15 years. Fecal occult blood test (FOBT) of the stool. You may have this test every year starting at age 77. Flexible sigmoidoscopy or colonoscopy. You may have a sigmoidoscopy every 5 years or a colonoscopy every 10 years starting at age 67. Prostate cancer screening. Recommendations will vary depending on your family history and other risks. Hepatitis C blood test. Hepatitis B blood test. Sexually transmitted disease (STD) testing. Diabetes screening. This is done by checking your blood sugar (glucose) after you have not eaten for a while (fasting). You may have this done every 1-3 years. Abdominal aortic aneurysm (AAA) screening. You may need this if you are a current or former smoker. Osteoporosis. You may be screened starting at age 47 if you are at high risk. Talk with your health care provider about your test results, treatment options, and if necessary, the need for more tests. Vaccines  Your health care provider may recommend certain vaccines, such as: Influenza vaccine. This is recommended every year. Tetanus, diphtheria, and acellular pertussis (Tdap, Td) vaccine. You may need a Td booster every 10 years. Zoster vaccine. You may need this after age 96. Pneumococcal 13-valent conjugate (PCV13) vaccine. One dose is recommended after age 77. Pneumococcal polysaccharide (PPSV23) vaccine. One dose is recommended after age 77. Talk to your health care provider about which screenings and vaccines you  need and how often you need them. This  information is not intended to replace advice given to you by your health care provider. Make sure you discuss any questions you have with your health care provider. Document Released: 11/30/2015 Document Revised: 07/23/2016 Document Reviewed: 09/04/2015 Elsevier Interactive Patient Education  2017 [place] Prevention in the Home Falls can cause injuries. They can happen to people of all ages. There are many things you can do to make your home safe and to help prevent falls. What can I do on the outside of my home? Regularly fix the edges of walkways and driveways and fix any cracks. Remove anything that might make you trip as you walk through a door, such as a raised step or threshold. Trim any bushes or trees on the path to your home. Use bright outdoor lighting. Clear any walking paths of anything that might make someone trip, such as rocks or tools. Regularly check to see if handrails are loose or broken. Make sure that both sides of any steps have handrails. Any raised decks and porches should have guardrails on the edges. Have any leaves, snow, or ice cleared regularly. Use sand or salt on walking paths during winter. Clean up any spills in your garage right away. This includes oil or grease spills. What can I do in the bathroom? Use night lights. Install grab bars by the toilet and in the tub and shower. Do not use towel bars as grab bars. Use non-skid mats or decals in the tub or shower. If you need to sit down in the shower, use a plastic, non-slip stool. Keep the floor dry. Clean up any water that spills on the floor as soon as it happens. Remove soap buildup in the tub or shower regularly. Attach bath mats securely with double-sided non-slip rug tape. Do not have throw rugs and other things on the floor that can make you trip. What can I do in the bedroom? Use night lights. Make sure that you have a light by your bed that is easy to reach. Do not use any sheets or  blankets that are too big for your bed. They should not hang down onto the floor. Have a firm chair that has side arms. You can use this for support while you get dressed. Do not have throw rugs and other things on the floor that can make you trip. What can I do in the kitchen? Clean up any spills right away. Avoid walking on wet floors. Keep items that you use a lot in easy-to-reach places. If you need to reach something above you, use a strong step stool that has a grab bar. Keep electrical cords out of the way. Do not use floor polish or wax that makes floors slippery. If you must use wax, use non-skid floor wax. Do not have throw rugs and other things on the floor that can make you trip. What can I do with my stairs? Do not leave any items on the stairs. Make sure that there are handrails on both sides of the stairs and use them. Fix handrails that are broken or loose. Make sure that handrails are as long as the stairways. Check any carpeting to make sure that it is firmly attached to the stairs. Fix any carpet that is loose or worn. Avoid having throw rugs at the top or bottom of the stairs. If you do have throw rugs, attach them to the floor with carpet tape. Make sure that  you have a light switch at the top of the stairs and the bottom of the stairs. If you do not have them, ask someone to add them for you. What else can I do to help prevent falls? Wear shoes that: Do not have high heels. Have rubber bottoms. Are comfortable and fit you well. Are closed at the toe. Do not wear sandals. If you use a stepladder: Make sure that it is fully opened. Do not climb a closed stepladder. Make sure that both sides of the stepladder are locked into place. Ask someone to hold it for you, if possible. Clearly mark and make sure that you can see: Any grab bars or handrails. First and last steps. Where the edge of each step is. Use tools that help you move around (mobility aids) if they are  needed. These include: Canes. Walkers. Scooters. Crutches. Turn on the lights when you go into a dark area. Replace any light bulbs as soon as they burn out. Set up your furniture so you have a clear path. Avoid moving your furniture around. If any of your floors are uneven, fix them. If there are any pets around you, be aware of where they are. Review your medicines with your doctor. Some medicines can make you feel dizzy. This can increase your chance of falling. Ask your doctor what other things that you can do to help prevent falls. This information is not intended to replace advice given to you by your health care provider. Make sure you discuss any questions you have with your health care provider. Document Released: 08/30/2009 Document Revised: 04/10/2016 Document Reviewed: 12/08/2014 Elsevier Interactive Patient Education  2017 [employer].

## 2021-12-10 ENCOUNTER — Ambulatory Visit: Payer: Medicare Other

## 2021-12-16 DIAGNOSIS — D2272 Melanocytic nevi of left lower limb, including hip: Secondary | ICD-10-CM | POA: Diagnosis not present

## 2021-12-16 DIAGNOSIS — D2262 Melanocytic nevi of left upper limb, including shoulder: Secondary | ICD-10-CM | POA: Diagnosis not present

## 2021-12-16 DIAGNOSIS — D2261 Melanocytic nevi of right upper limb, including shoulder: Secondary | ICD-10-CM | POA: Diagnosis not present

## 2021-12-16 DIAGNOSIS — Z85828 Personal history of other malignant neoplasm of skin: Secondary | ICD-10-CM | POA: Diagnosis not present

## 2021-12-16 DIAGNOSIS — L570 Actinic keratosis: Secondary | ICD-10-CM | POA: Diagnosis not present

## 2021-12-16 DIAGNOSIS — X32XXXA Exposure to sunlight, initial encounter: Secondary | ICD-10-CM | POA: Diagnosis not present

## 2021-12-24 ENCOUNTER — Encounter: Payer: Self-pay | Admitting: Family Medicine

## 2022-01-01 DIAGNOSIS — I700 Atherosclerosis of aorta: Secondary | ICD-10-CM | POA: Diagnosis not present

## 2022-01-01 DIAGNOSIS — E782 Mixed hyperlipidemia: Secondary | ICD-10-CM | POA: Diagnosis not present

## 2022-01-01 DIAGNOSIS — I2510 Atherosclerotic heart disease of native coronary artery without angina pectoris: Secondary | ICD-10-CM | POA: Diagnosis not present

## 2022-01-01 DIAGNOSIS — I10 Essential (primary) hypertension: Secondary | ICD-10-CM | POA: Diagnosis not present

## 2022-01-01 DIAGNOSIS — E119 Type 2 diabetes mellitus without complications: Secondary | ICD-10-CM | POA: Diagnosis not present

## 2022-01-02 ENCOUNTER — Encounter: Payer: Self-pay | Admitting: Internal Medicine

## 2022-01-06 DIAGNOSIS — L298 Other pruritus: Secondary | ICD-10-CM | POA: Diagnosis not present

## 2022-01-06 DIAGNOSIS — L820 Inflamed seborrheic keratosis: Secondary | ICD-10-CM | POA: Diagnosis not present

## 2022-01-06 DIAGNOSIS — L309 Dermatitis, unspecified: Secondary | ICD-10-CM | POA: Diagnosis not present

## 2022-01-07 ENCOUNTER — Other Ambulatory Visit (HOSPITAL_COMMUNITY): Payer: Self-pay

## 2022-01-17 ENCOUNTER — Telehealth: Payer: Self-pay | Admitting: Family Medicine

## 2022-01-17 NOTE — Telephone Encounter (Signed)
Pt called to cancel future appt. Pt will transfer his care to the [facility]. ?

## 2022-02-05 ENCOUNTER — Other Ambulatory Visit: Payer: Self-pay

## 2022-02-05 ENCOUNTER — Ambulatory Visit (INDEPENDENT_AMBULATORY_CARE_PROVIDER_SITE_OTHER): Payer: Medicare Other | Admitting: Podiatry

## 2022-02-05 DIAGNOSIS — B351 Tinea unguium: Secondary | ICD-10-CM

## 2022-02-05 NOTE — Progress Notes (Signed)
?  Subjective:  ?Patient ID: Corey Daniel, male    DOB: 09/24/1946,  MRN: [id number] ? ?Chief Complaint  ?Patient presents with  ? Nail Problem  ?  Thick painful toenails, 3 month follow up   ? ? ?76 y.o. male returns for follow-up with the above complaint. History confirmed with patient.  He is doing well he notes improvement ? ?Objective:  ?Physical Exam: ?warm, good capillary refill, no trophic changes or ulcerative lesions, normal DP and PT pulses and normal sensory exam. ?Both hallux nails have some slight yellow discoloration but significantly improved with nearly 100% improvement ? ? ? ? ? ? ? ? ? ? ?[place] pathology results with onychomycosis, moderate fungal growth, species of Trichophyton and Pseudomonas ? ?LFTs normal ? ?Assessment:  ? ?No diagnosis found. ? ? ? ? ?Plan:  ?Patient was evaluated and treated and all questions answered. ? ? ?Doing very well has made significant clinical progress since 1 year ago when he started treatment.  He will continue to use the [place] as needed until it completely resolved.  Return to see me as needed ? ? ?Return if symptoms worsen or fail to improve.  ? ?

## 2022-02-11 ENCOUNTER — Ambulatory Visit: Payer: Medicare Other | Admitting: Family Medicine

## 2022-04-20 ENCOUNTER — Encounter: Payer: Self-pay | Admitting: Internal Medicine

## 2022-04-21 ENCOUNTER — Telehealth: Payer: Self-pay | Admitting: *Deleted

## 2022-04-21 NOTE — Telephone Encounter (Signed)
Patient called to cancel his June appointment with Dr. Burlene Arnt  he stated that he is now under the care of the [place] and will no longer be treated here.

## 2022-04-21 NOTE — Telephone Encounter (Signed)
Appts have been cancelled as requested in a MyChart patient that was sent.

## 2022-04-24 ENCOUNTER — Inpatient Hospital Stay: Payer: TRICARE For Life (TFL) | Admitting: Internal Medicine

## 2022-04-24 ENCOUNTER — Inpatient Hospital Stay: Payer: TRICARE For Life (TFL)

## 2022-06-02 ENCOUNTER — Encounter: Payer: Self-pay | Admitting: Internal Medicine

## 2022-06-09 ENCOUNTER — Other Ambulatory Visit: Payer: Self-pay | Admitting: Oncology

## 2022-06-14 DIAGNOSIS — Z03818 Encounter for observation for suspected exposure to other biological agents ruled out: Secondary | ICD-10-CM | POA: Diagnosis not present

## 2022-06-17 DIAGNOSIS — R059 Cough, unspecified: Secondary | ICD-10-CM | POA: Diagnosis not present

## 2022-06-17 DIAGNOSIS — J069 Acute upper respiratory infection, unspecified: Secondary | ICD-10-CM | POA: Diagnosis not present

## 2022-06-17 DIAGNOSIS — Z03818 Encounter for observation for suspected exposure to other biological agents ruled out: Secondary | ICD-10-CM | POA: Diagnosis not present

## 2022-06-17 DIAGNOSIS — J209 Acute bronchitis, unspecified: Secondary | ICD-10-CM | POA: Diagnosis not present

## 2022-06-17 DIAGNOSIS — R053 Chronic cough: Secondary | ICD-10-CM | POA: Diagnosis not present

## 2022-07-02 DIAGNOSIS — Z96641 Presence of right artificial hip joint: Secondary | ICD-10-CM | POA: Diagnosis not present

## 2022-07-02 DIAGNOSIS — M1711 Unilateral primary osteoarthritis, right knee: Secondary | ICD-10-CM | POA: Diagnosis not present

## 2022-07-02 DIAGNOSIS — M7061 Trochanteric bursitis, right hip: Secondary | ICD-10-CM | POA: Diagnosis not present

## 2022-11-09 ENCOUNTER — Encounter: Payer: Self-pay | Admitting: Physician Assistant

## 2022-11-09 ENCOUNTER — Ambulatory Visit
Admission: EM | Admit: 2022-11-09 | Discharge: 2022-11-09 | Disposition: A | Discharge status: Home / Self Care | Payer: Medicare Other | Attending: Physician Assistant | Admitting: Physician Assistant

## 2022-11-09 DIAGNOSIS — J069 Acute upper respiratory infection, unspecified: Secondary | ICD-10-CM | POA: Diagnosis not present

## 2022-11-09 DIAGNOSIS — R0981 Nasal congestion: Secondary | ICD-10-CM | POA: Diagnosis not present

## 2022-11-09 DIAGNOSIS — U071 COVID-19: Secondary | ICD-10-CM | POA: Insufficient documentation

## 2022-11-09 DIAGNOSIS — R051 Acute cough: Secondary | ICD-10-CM | POA: Diagnosis not present

## 2022-11-09 LAB — SARS CORONAVIRUS 2 BY RT PCR: SARS Coronavirus 2 by RT PCR: POSITIVE — AB

## 2022-11-09 NOTE — ED Provider Notes (Signed)
[facility]    CSN: [id number] Arrival date & time: 11/09/22  1341      History   Chief Complaint Chief Complaint  Patient presents with   Cough    HPI Corey Daniel is a 76 y.o. male presenting for cough, congestion, scratchy throat and fatigue x 3 days. Denies fever, weakness, shortness of breath, abdominal pain, vomiting or diarrhea. Patient reports 2 positive at home COVID tests. Wife has similar symptoms.  Patient is unsure if the at-home COVID test are reliable.  His wife is being tested for COVID today.  He did just return from [place] where he went to visit his almost friends.  He was exposed to large groups of people in a 1 room school house for a couple of plays.  Taking over-the-counter Zicam and nasal sprays.  PMH significant for cardiovascular disease, lymphoma, binging, hyperlipidemia and prediabetes.  HPI  Past Medical History:  Diagnosis Date   Arthritis    ASCVD (arteriosclerotic cardiovascular disease)    Back pain    with radiation   Cancer ([place])    lymphoma   Coronary artery disease    Hyperlipidemia    Hypertension    IDA (iron deficiency anemia)    Lower leg pain    Myocardial infarction [facility])    Prediabetes     Patient Active Problem List   Diagnosis Date Noted   Primary osteoarthritis of right hip 01/28/2021   Epigastric pain 01/07/2021   Iron deficiency anemia 01/07/2021   Personal history of colonic polyps 01/07/2021   Atherosclerosis of aorta ([place]) 02/01/2019   Bilateral sciatica 09/19/2018   S/P lumbar spinal arthrodesis 09/19/2018   Abnormal CXR 09/10/2018   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[place]) 07/21/2017   History of anemia 07/17/2013   H/O arthrodesis 07/15/2013   DDD (degenerative disc disease), lumbosacral 07/14/2013   Spinal stenosis of lumbar region with neurogenic claudication 07/14/2013   Myxoid cyst 07/14/2013   Thoracic and lumbosacral neuritis 07/14/2013   Arteriosclerosis of coronary artery 07/11/2013    Pre-diabetes 07/11/2013   Essential hypertension 07/11/2013   Mixed hyperlipidemia 07/11/2013    Past Surgical History:  Procedure Laterality Date   AUTOGRAFT STRUCTURAL ICBG OBTAINED SEPARATE INCISION FOR SPINE SURGERY   Bilateral 07/15/2013   BACK SURGERY     CATARACT EXTRACTION  2010   COLONOSCOPY  06/2017   CORONARY ARTERY BYPASS GRAFT     IR IMAGING GUIDED PORT INSERTION  07/31/2017   IR REMOVAL TUN ACCESS W/ PORT W/O FL MOD SED  02/12/2018   LAMINECTOMY LUMBAR EXCISON/EVACUATION EXTRADURAL INTRASPINAL LESION Bilateral 07/15/2013      LAMINOTOMY REEXPLORATION POSTERIOR LUMBAR W/NERVE DECOMP & DISCECTOMY Bilateral 07/15/2013      UPPER GI ENDOSCOPY  06/2017       Home Medications    Prior to Admission medications   Medication Sig Start Date End Date Taking? Authorizing Provider  aspirin 81 MG chewable tablet Chew 81 mg by mouth daily.     [provider]  carvedilol (COREG) 3.125 MG tablet Take 1 tablet (3.125 mg total) by mouth 2 (two) times daily with a meal. 08/13/21   Karamalegos, Devonne Doughty, DO  Cholecalciferol (VITAMIN D3) 2000 UNITS capsule Take 2,000 Units by mouth daily.     [provider]  ciclopirox (PENLAC) 8 % solution Apply topically at bedtime. Apply over nail and surrounding skin. Apply daily over previous coat. After seven (7) days, may remove with alcohol and continue cycle. 06/05/21   Criselda Peaches, DPM  Coenzyme Q10 100 MG capsule Take by mouth.    [provider]  ezetimibe (ZETIA) 10 MG tablet Take 1 tablet (10 mg total) by mouth at bedtime. 08/13/21   Karamalegos, Devonne Doughty, DO  Fish Oil-Cholecalciferol (FISH OIL + D3) 1000-1000 MG-UNIT CAPS Take by mouth.    [provider]  lisinopril (ZESTRIL) 2.5 MG tablet Take 1 tablet (2.5 mg total) by mouth daily. 08/13/21   Karamalegos, Devonne Doughty, DO  metFORMIN (GLUCOPHAGE-XR) 500 MG 24 hr tablet Take 1 tablet (500 mg total) by mouth daily with breakfast. 08/13/21   Parks Ranger,  Devonne Doughty, DO  nirmatrelvir & ritonavir (PAXLOVID, 150/100,) 10 x 150 MG & 10 x '100MG'$  TBPK Take 2 tablets ( 1 nirmatrelvir and 1 ritonavir) by mouth 2 (two) times daily. Patient not taking: Reported on 12/06/2021 11/26/21   Juanito Doom, MD  omeprazole (PRILOSEC) 40 MG capsule Take 40 mg by mouth daily. 09/12/21   [provider]  rosuvastatin (CRESTOR) 5 MG tablet Take 1 tablet (5 mg total) by mouth 2 (two) times a week. 08/15/21   Olin Hauser, DO    Family History Family History  Problem Relation Age of Onset   Heart disease Mother    Coarctation of the aorta Brother     Social History Social History   Tobacco Use   Smoking status: Former    Packs/day: 0.50    Years: 30.00    Total pack years: 15.00    Types: Cigarettes    Quit date: 70    Years since quitting: 41.0   Smokeless tobacco: Never  Vaping Use   Vaping Use: Never used  Substance Use Topics   Alcohol use: No   Drug use: No     Allergies   Tramadol   Review of Systems Review of Systems  Constitutional:  Positive for fatigue. Negative for fever.  HENT:  Positive for congestion, postnasal drip, rhinorrhea and sore throat. Negative for sinus pressure and sinus pain.   Respiratory:  Positive for cough. Negative for shortness of breath.   Gastrointestinal:  Negative for abdominal pain, diarrhea, nausea and vomiting.  Musculoskeletal:  Negative for myalgias.  Neurological:  Negative for weakness, light-headedness and headaches.  Hematological:  Negative for adenopathy.     Physical Exam Triage Vital Signs ED Triage Vitals  Enc Vitals Group     BP 11/09/22 1542 (!) 140/84     Pulse Rate 11/09/22 1542 84     Resp 11/09/22 1542 18     Temp 11/09/22 1542 99.3 F (37.4 C)     Temp Source 11/09/22 1542 Oral     SpO2 11/09/22 1542 96 %     Weight --      Height --      Head Circumference --      Peak Flow --      Pain Score 11/09/22 1543 0     Pain Loc --      Pain Edu? --       Excl. in [place]? --    No data found.  Updated Vital Signs BP (!) 140/84 (BP Location: Left Arm)   Pulse 84   Temp 99.3 F (37.4 C) (Oral)   Resp 18   SpO2 96%   Physical Exam Vitals and nursing note reviewed.  Constitutional:      General: He is not in acute distress.    Appearance: Normal appearance. He is well-developed. He is not ill-appearing.  HENT:  Head: Normocephalic and atraumatic.     Nose: Congestion present.     Mouth/Throat:     Mouth: Mucous membranes are moist.     Pharynx: Oropharynx is clear. Posterior oropharyngeal erythema present.     Comments: +mild voice hoarseness Eyes:     General: No scleral icterus.    Conjunctiva/sclera: Conjunctivae normal.  Cardiovascular:     Rate and Rhythm: Normal rate and regular rhythm.     Heart sounds: Normal heart sounds.  Pulmonary:     Effort: Pulmonary effort is normal. No respiratory distress.     Breath sounds: Normal breath sounds.  Abdominal:     Palpations: Abdomen is soft.     Tenderness: There is no abdominal tenderness.  Musculoskeletal:     Cervical back: Neck supple.  Skin:    General: Skin is warm and dry.     Capillary Refill: Capillary refill takes less than 2 seconds.  Neurological:     General: No focal deficit present.     Mental Status: He is alert. Mental status is at baseline.     Motor: No weakness.     Gait: Gait normal.  Psychiatric:        Mood and Affect: Mood normal.        Behavior: Behavior normal.      UC Treatments / Results  Labs (all labs ordered are listed, but only abnormal results are displayed) Labs Reviewed  SARS CORONAVIRUS 2 BY RT PCR    EKG   Radiology No results found.  Procedures Procedures (including critical care time)  Medications Ordered in UC Medications - No data to display  Initial Impression / Assessment and Plan / UC Course  I have reviewed the triage vital signs and the nursing notes.  Pertinent labs & imaging results that were  available during my care of the patient were reviewed by me and considered in my medical decision making (see chart for details).   76 year old male presents for approximately 3-day history of cough, congestion, postnasal drainage.  2 positive at home COVID tests.  Vitals are stable.  He is overall well-appearing.  Mild nasal congestion, posterior pharyngeal erythema and clear postnasal drainage.  Chest clear auscultation heart regular rate rhythm.  Advised patient that his at home COVID test is likely very accurate since they were positive x 2.  His wife is being tested for COVID in the clinic today.    Patient's wife's COVID test is negative.  Patient would like to be tested in case his test was a false positive.  I have agreed.  I will contact him through MyChart with his result.  I expect it will be positive.  We discussed current CDC guidelines, isolation protocol and ED precautions.  He says he is not interested in antiviral medication if he test positive and he just wants to know if he is positive.  Advised of over-the-counter cough medication, rest and fluids.  Follow-up as needed.   Final Clinical Impressions(s) / UC Diagnoses   Final diagnoses:  Acute cough  Nasal congestion  Viral URI with cough     Discharge Instructions      -We are testing you for COVID.  I will contact you through MyChart with the results.  If you are positive, you need to make sure to isolate 5 days from symptom onset and then wear mask x 5 days. - Cough medication as needed for your symptoms, rest and fluids, Tylenol Motrin as needed for any fever  or bodyaches/headaches. - You need to be seen again if you have any uncontrolled fever, weakness or shortness of breath.     ED Prescriptions   None    PDMP not reviewed this encounter.   Danton Clap, PA-C 11/09/22 1637

## 2022-11-09 NOTE — Discharge Instructions (Addendum)
-  We are testing you for COVID.  I will contact you through MyChart with the results.  If you are positive, you need to make sure to isolate 5 days from symptom onset and then wear mask x 5 days. - Cough medication as needed for your symptoms, rest and fluids, Tylenol Motrin as needed for any fever or bodyaches/headaches. - You need to be seen again if you have any uncontrolled fever, weakness or shortness of breath.

## 2022-11-09 NOTE — ED Triage Notes (Signed)
Patient with c/o cough and congestion. States he took 2 covid test at home and they were positive.

## 2023-01-06 ENCOUNTER — Other Ambulatory Visit (HOSPITAL_COMMUNITY): Payer: Self-pay

## 2024-05-04 ENCOUNTER — Other Ambulatory Visit: Payer: Self-pay | Admitting: Medical

## 2024-05-04 DIAGNOSIS — C859A Non-Hodgkin lymphoma, unspecified, in remission: Secondary | ICD-10-CM

## 2024-05-10 ENCOUNTER — Ambulatory Visit
Admission: RE | Admit: 2024-05-10 | Discharge: 2024-05-10 | Disposition: A | Discharge status: Home / Self Care | Source: Ambulatory Visit | Attending: Medical | Admitting: Medical

## 2024-05-10 VITALS — Ht 71.0 in | Wt 207.0 lb

## 2024-05-10 DIAGNOSIS — C833A Diffuse large b-cell lymphoma, in remission: Secondary | ICD-10-CM | POA: Diagnosis not present

## 2024-05-10 DIAGNOSIS — K760 Fatty (change of) liver, not elsewhere classified: Secondary | ICD-10-CM | POA: Insufficient documentation

## 2024-05-10 DIAGNOSIS — J439 Emphysema, unspecified: Secondary | ICD-10-CM | POA: Insufficient documentation

## 2024-05-10 DIAGNOSIS — Z9221 Personal history of antineoplastic chemotherapy: Secondary | ICD-10-CM | POA: Insufficient documentation

## 2024-05-10 DIAGNOSIS — I700 Atherosclerosis of aorta: Secondary | ICD-10-CM | POA: Diagnosis not present

## 2024-05-10 DIAGNOSIS — C859A Non-Hodgkin lymphoma, unspecified, in remission: Secondary | ICD-10-CM

## 2024-05-10 DIAGNOSIS — R7303 Prediabetes: Secondary | ICD-10-CM | POA: Diagnosis not present

## 2024-05-10 LAB — GLUCOSE, CAPILLARY: Glucose-Capillary: 124 mg/dL — ABNORMAL HIGH (ref 70–99)

## 2024-05-10 MED ORDER — FLUDEOXYGLUCOSE F - 18 (FDG) INJECTION
10.7000 | Freq: Once | INTRAVENOUS | Status: AC | PRN
  Administered 2024-05-10: 11.22 via INTRAVENOUS
